# Patient Record
Sex: Female | Born: 1960
Health system: Southern US, Community
[De-identification: ages and names within clinical notes are randomized; demographics above are authoritative.]

## PROBLEM LIST (undated history)

## (undated) DIAGNOSIS — M1990 Unspecified osteoarthritis, unspecified site: Secondary | ICD-10-CM

## (undated) DIAGNOSIS — F419 Anxiety disorder, unspecified: Secondary | ICD-10-CM

## (undated) HISTORY — PX: CATARACT EXTRACTION: SUR2

## (undated) HISTORY — DX: Unspecified osteoarthritis, unspecified site: M19.90

## (undated) HISTORY — PX: OOPHORECTOMY: SHX86

---

## 1998-12-06 ENCOUNTER — Ambulatory Visit (HOSPITAL_BASED_OUTPATIENT_CLINIC_OR_DEPARTMENT_OTHER): Admission: RE | Admit: 1998-12-06 | Discharge: 1998-12-06 | Payer: Self-pay | Admitting: Orthopedic Surgery

## 1998-12-17 ENCOUNTER — Other Ambulatory Visit: Admission: RE | Admit: 1998-12-17 | Discharge: 1998-12-17 | Payer: Self-pay | Admitting: Gynecology

## 1999-09-05 HISTORY — PX: ABDOMINAL HYSTERECTOMY: SHX81

## 2000-03-08 ENCOUNTER — Other Ambulatory Visit: Admission: RE | Admit: 2000-03-08 | Discharge: 2000-03-08 | Payer: Self-pay | Admitting: Obstetrics and Gynecology

## 2000-05-14 ENCOUNTER — Encounter (INDEPENDENT_AMBULATORY_CARE_PROVIDER_SITE_OTHER): Payer: Self-pay | Admitting: Specialist

## 2000-05-14 ENCOUNTER — Inpatient Hospital Stay (HOSPITAL_COMMUNITY): Admission: RE | Admit: 2000-05-14 | Discharge: 2000-05-17 | Payer: Self-pay | Admitting: Gynecology

## 2001-01-20 ENCOUNTER — Inpatient Hospital Stay (HOSPITAL_COMMUNITY): Admission: AD | Admit: 2001-01-20 | Discharge: 2001-01-20 | Payer: Self-pay | Admitting: Gynecology

## 2001-10-18 ENCOUNTER — Encounter: Admission: RE | Admit: 2001-10-18 | Discharge: 2001-10-18 | Payer: Self-pay

## 2002-08-22 ENCOUNTER — Inpatient Hospital Stay (HOSPITAL_COMMUNITY): Admission: AD | Admit: 2002-08-22 | Discharge: 2002-08-22 | Payer: Self-pay | Admitting: Family Medicine

## 2002-09-04 HISTORY — PX: OTHER SURGICAL HISTORY: SHX169

## 2002-10-11 ENCOUNTER — Emergency Department (HOSPITAL_COMMUNITY): Admission: EM | Admit: 2002-10-11 | Discharge: 2002-10-11 | Payer: Self-pay | Admitting: Emergency Medicine

## 2002-10-11 ENCOUNTER — Encounter: Payer: Self-pay | Admitting: Emergency Medicine

## 2002-10-14 ENCOUNTER — Encounter: Admission: RE | Admit: 2002-10-14 | Discharge: 2002-10-14 | Payer: Self-pay | Admitting: Family Medicine

## 2002-10-14 ENCOUNTER — Encounter: Payer: Self-pay | Admitting: Family Medicine

## 2002-11-19 ENCOUNTER — Encounter: Payer: Self-pay | Admitting: Emergency Medicine

## 2002-11-20 ENCOUNTER — Inpatient Hospital Stay (HOSPITAL_COMMUNITY): Admission: EM | Admit: 2002-11-20 | Discharge: 2002-12-02 | Payer: Self-pay | Admitting: Emergency Medicine

## 2002-11-20 ENCOUNTER — Encounter: Payer: Self-pay | Admitting: General Surgery

## 2002-11-21 ENCOUNTER — Encounter: Payer: Self-pay | Admitting: Surgery

## 2002-11-22 ENCOUNTER — Encounter: Payer: Self-pay | Admitting: Internal Medicine

## 2002-11-23 ENCOUNTER — Encounter: Payer: Self-pay | Admitting: Surgery

## 2002-11-24 ENCOUNTER — Encounter: Payer: Self-pay | Admitting: Internal Medicine

## 2002-11-25 ENCOUNTER — Encounter: Payer: Self-pay | Admitting: General Surgery

## 2002-11-26 ENCOUNTER — Encounter (INDEPENDENT_AMBULATORY_CARE_PROVIDER_SITE_OTHER): Payer: Self-pay | Admitting: Specialist

## 2002-11-26 ENCOUNTER — Encounter: Payer: Self-pay | Admitting: Surgery

## 2003-01-29 ENCOUNTER — Ambulatory Visit (HOSPITAL_COMMUNITY): Admission: RE | Admit: 2003-01-29 | Discharge: 2003-01-29 | Payer: Self-pay | Admitting: General Surgery

## 2003-01-29 ENCOUNTER — Encounter: Payer: Self-pay | Admitting: General Surgery

## 2003-02-03 ENCOUNTER — Encounter: Payer: Self-pay | Admitting: Emergency Medicine

## 2003-02-03 ENCOUNTER — Inpatient Hospital Stay (HOSPITAL_COMMUNITY): Admission: EM | Admit: 2003-02-03 | Discharge: 2003-02-06 | Payer: Self-pay

## 2003-02-03 ENCOUNTER — Encounter: Payer: Self-pay | Admitting: Gastroenterology

## 2003-02-04 ENCOUNTER — Encounter: Payer: Self-pay | Admitting: Surgery

## 2003-02-04 ENCOUNTER — Encounter: Payer: Self-pay | Admitting: Gastroenterology

## 2003-11-19 ENCOUNTER — Ambulatory Visit (HOSPITAL_COMMUNITY): Admission: RE | Admit: 2003-11-19 | Discharge: 2003-11-19 | Payer: Self-pay | Admitting: Internal Medicine

## 2003-12-03 ENCOUNTER — Ambulatory Visit (HOSPITAL_COMMUNITY): Admission: RE | Admit: 2003-12-03 | Discharge: 2003-12-03 | Payer: Self-pay | Admitting: Internal Medicine

## 2004-09-09 ENCOUNTER — Ambulatory Visit: Payer: Self-pay | Admitting: Family Medicine

## 2004-09-09 ENCOUNTER — Encounter: Admission: RE | Admit: 2004-09-09 | Discharge: 2004-09-09 | Payer: Self-pay | Admitting: Family Medicine

## 2006-02-15 ENCOUNTER — Other Ambulatory Visit: Admission: RE | Admit: 2006-02-15 | Discharge: 2006-02-15 | Payer: Self-pay | Admitting: Obstetrics and Gynecology

## 2006-03-05 ENCOUNTER — Encounter: Admission: RE | Admit: 2006-03-05 | Discharge: 2006-03-05 | Payer: Self-pay | Admitting: Obstetrics and Gynecology

## 2006-03-16 ENCOUNTER — Encounter: Admission: RE | Admit: 2006-03-16 | Discharge: 2006-03-16 | Payer: Self-pay | Admitting: Obstetrics and Gynecology

## 2007-03-06 ENCOUNTER — Other Ambulatory Visit: Admission: RE | Admit: 2007-03-06 | Discharge: 2007-03-06 | Payer: Self-pay | Admitting: Obstetrics and Gynecology

## 2007-06-11 DIAGNOSIS — K429 Umbilical hernia without obstruction or gangrene: Secondary | ICD-10-CM | POA: Insufficient documentation

## 2007-07-01 ENCOUNTER — Ambulatory Visit: Payer: Self-pay | Admitting: Internal Medicine

## 2007-07-01 DIAGNOSIS — H5450 Low vision, one eye, unspecified eye: Secondary | ICD-10-CM | POA: Insufficient documentation

## 2007-07-01 DIAGNOSIS — K56609 Unspecified intestinal obstruction, unspecified as to partial versus complete obstruction: Secondary | ICD-10-CM | POA: Insufficient documentation

## 2007-07-04 ENCOUNTER — Ambulatory Visit: Payer: Self-pay | Admitting: Internal Medicine

## 2007-07-08 LAB — CONVERTED CEMR LAB
Calcium: 9.2 mg/dL (ref 8.4–10.5)
Cholesterol: 228 mg/dL (ref 0–200)
Eosinophils Relative: 0.6 % (ref 0.0–5.0)
GFR calc Af Amer: 115 mL/min
Hemoglobin: 12.9 g/dL (ref 12.0–15.0)
Lymphocytes Relative: 45 % (ref 12.0–46.0)
MCHC: 33.9 g/dL (ref 30.0–36.0)
MCV: 90.6 fL (ref 78.0–100.0)
Monocytes Relative: 7.7 % (ref 3.0–11.0)
Neutro Abs: 2.2 10*3/uL (ref 1.4–7.7)
Neutrophils Relative %: 46.2 % (ref 43.0–77.0)

## 2007-08-12 ENCOUNTER — Emergency Department (HOSPITAL_COMMUNITY): Admission: EM | Admit: 2007-08-12 | Discharge: 2007-08-12 | Payer: Self-pay | Admitting: Emergency Medicine

## 2008-06-17 LAB — CONVERTED CEMR LAB: Pap Smear: NORMAL

## 2009-04-01 ENCOUNTER — Encounter: Admission: RE | Admit: 2009-04-01 | Discharge: 2009-04-01 | Payer: Self-pay | Admitting: Orthopedic Surgery

## 2009-04-26 ENCOUNTER — Encounter: Admission: RE | Admit: 2009-04-26 | Discharge: 2009-04-26 | Payer: Self-pay | Admitting: Orthopedic Surgery

## 2009-04-29 ENCOUNTER — Encounter: Admission: RE | Admit: 2009-04-29 | Discharge: 2009-04-29 | Payer: Self-pay | Admitting: Orthopedic Surgery

## 2009-12-14 ENCOUNTER — Encounter: Payer: Self-pay | Admitting: Family Medicine

## 2009-12-14 ENCOUNTER — Encounter: Payer: Self-pay | Admitting: Internal Medicine

## 2009-12-15 ENCOUNTER — Encounter (INDEPENDENT_AMBULATORY_CARE_PROVIDER_SITE_OTHER): Payer: Self-pay | Admitting: *Deleted

## 2009-12-15 ENCOUNTER — Ambulatory Visit: Payer: Self-pay | Admitting: Family Medicine

## 2009-12-15 DIAGNOSIS — M1990 Unspecified osteoarthritis, unspecified site: Secondary | ICD-10-CM | POA: Insufficient documentation

## 2009-12-15 LAB — CONVERTED CEMR LAB
ALT: 10 units/L (ref 0–35)
Albumin: 4 g/dL (ref 3.5–5.2)
Alkaline Phosphatase: 66 units/L (ref 39–117)
Basophils Relative: 0.5 % (ref 0.0–3.0)
Bilirubin Urine: NEGATIVE
Bilirubin, Direct: 0.1 mg/dL (ref 0.0–0.3)
Blood in Urine, dipstick: NEGATIVE
Calcium: 8.9 mg/dL (ref 8.4–10.5)
Chloride: 105 meq/L (ref 96–112)
Creatinine, Ser: 0.7 mg/dL (ref 0.4–1.2)
Direct LDL: 141.5 mg/dL
Eosinophils Absolute: 0 10*3/uL (ref 0.0–0.7)
Glucose, Bld: 94 mg/dL (ref 70–99)
HCT: 38.3 % (ref 36.0–46.0)
Hemoglobin: 12.9 g/dL (ref 12.0–15.0)
Ketones, urine, test strip: NEGATIVE
MCHC: 33.6 g/dL (ref 30.0–36.0)
MCV: 91.3 fL (ref 78.0–100.0)
Monocytes Absolute: 0.3 10*3/uL (ref 0.1–1.0)
Neutrophils Relative %: 48.1 % (ref 43.0–77.0)
Platelets: 206 10*3/uL (ref 150.0–400.0)
RBC: 4.19 M/uL (ref 3.87–5.11)
Specific Gravity, Urine: 1.015
TSH: 1.11 microintl units/mL (ref 0.35–5.50)
Total Bilirubin: 0.6 mg/dL (ref 0.3–1.2)
Total CHOL/HDL Ratio: 3
Triglycerides: 101 mg/dL (ref 0.0–149.0)
Urobilinogen, UA: NEGATIVE
VLDL: 20.2 mg/dL (ref 0.0–40.0)
WBC: 4.4 10*3/uL — ABNORMAL LOW (ref 4.5–10.5)
pH: 7.5

## 2009-12-17 ENCOUNTER — Encounter (INDEPENDENT_AMBULATORY_CARE_PROVIDER_SITE_OTHER): Payer: Self-pay | Admitting: *Deleted

## 2009-12-20 ENCOUNTER — Ambulatory Visit: Payer: Self-pay | Admitting: Family Medicine

## 2010-02-07 ENCOUNTER — Encounter: Payer: Self-pay | Admitting: Family Medicine

## 2010-02-07 ENCOUNTER — Encounter: Admission: RE | Admit: 2010-02-07 | Discharge: 2010-02-07 | Payer: Self-pay | Admitting: Family Medicine

## 2010-02-07 LAB — HM MAMMOGRAPHY: HM Mammogram: NEGATIVE

## 2010-10-06 NOTE — Assessment & Plan Note (Signed)
Summary: cpx//pt will be fasting//lch   Vital Signs:  Patient profile:   50 year old female Height:      65.25 inches Weight:      198 pounds BMI:     32.82 Pulse rate:   76 / minute Pulse rhythm:   regular BP sitting:   122 / 80  (left arm) Cuff size:   regular  Vitals Entered By: Army Fossa CMA (December 15, 2009 8:38 AM) CC: Pt here for CPX. No pap.    History of Present Illness: Pt here for CPE and labs.  No pap pt sees gyn but she needs a new one.  No complaints.    Preventive Screening-Counseling & Management  Alcohol-Tobacco     Alcohol drinks/day: 0     Smoking Status: never  Caffeine-Diet-Exercise     Caffeine use/day: <1     Does Patient Exercise: yes     Type of exercise: WALKING     Exercise (avg: min/session): >60     Times/week: 3  Hep-HIV-STD-Contraception     Dental Visit-last 6 months yes     Dental Care Counseling: not indicated; dental care within six months     SBE monthly: yes     SBE Education/Counseling: not indicated; SBE done regularly      Sexual History:  divorced not currently sexually active.        Drug Use:  no.    Current Medications (verified): 1)  None  Allergies (verified): No Known Drug Allergies  Past History:  Family History: Last updated: 07/01/2007 colonca -- no breast ca--no DM--no MI-- in her 24s Family History of CAD Female 1st degree relative <60  Social History: Last updated: 12/15/2009 Single, divorce 1 child Occupation:  Actor Divorced Never Smoked Alcohol use-no Drug use-no Regular exercise-yes  Risk Factors: Alcohol Use: 0 (12/15/2009) Caffeine Use: <1 (12/15/2009) Exercise: yes (12/15/2009)  Risk Factors: Smoking Status: never (12/15/2009)  Past Medical History:  Osteoarthritis  Past Surgical History: obstructed bile duct---Dr Micki Riley-- 2004 Hysterectomy-- TAH/BSO 2001--fibroids  Family History: Reviewed history from 07/01/2007 and no changes required. colonca --  no breast ca--no DM--no MI-- in her 59s Family History of CAD Female 1st degree relative <60  Social History: Reviewed history from 07/01/2007 and no changes required. 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 Eyes:  Denies blurring, discharge, double vision, eye irritation, eye pain, halos, itching, light sensitivity, red eye, vision loss-1 eye, and vision loss-both eyes; optho q1y-- Dr Elmer Picker. ENT:  Denies decreased hearing, difficulty swallowing, ear discharge, earache, hoarseness, nasal congestion, nosebleeds, postnasal drainage, ringing in ears, sinus pressure, and sore throat. CV:  Denies bluish discoloration of lips or nails, chest pain or discomfort, difficulty breathing at night, difficulty breathing while lying down, fainting, fatigue, leg cramps with exertion, lightheadness, near fainting, palpitations, shortness of breath with exertion, swelling of feet, swelling of hands, and weight gain. Resp:  Denies chest discomfort, chest pain with inspiration, cough, coughing up blood, excessive snoring, hypersomnolence, morning headaches, pleuritic, shortness of breath, sputum productive, and wheezing. GI:  Denies abdominal pain, bloody stools, change in bowel habits, constipation, dark tarry  stools, diarrhea, excessive appetite, gas, hemorrhoids, indigestion, loss of appetite, nausea, vomiting, vomiting blood, and yellowish skin color. GU:  Denies abnormal vaginal bleeding, decreased libido, discharge, dysuria, genital sores, hematuria, incontinence, nocturia, urinary frequency, and urinary hesitancy. MS:  Complains of  joint pain; + OA. Derm:  Denies changes in color of skin, changes in nail beds, dryness, excessive perspiration, flushing, hair loss, insect bite(s), itching, lesion(s), poor wound healing, and rash. Neuro:  Denies brief paralysis, difficulty with concentration, disturbances in coordination, falling down, headaches, inability to speak, memory loss, numbness, poor balance, seizures, sensation of room spinning, tingling, tremors, visual disturbances, and weakness. Psych:  Denies alternate hallucination ( auditory/visual), anxiety, depression, easily angered, easily tearful, irritability, mental problems, panic attacks, sense of great danger, suicidal thoughts/plans, thoughts of violence, unusual visions or sounds, and thoughts /plans of harming others. Endo:  Denies cold intolerance, excessive hunger, excessive thirst, excessive urination, heat intolerance, polyuria, and weight change. Heme:  Denies abnormal bruising, bleeding, enlarge lymph nodes, fevers, pallor, and skin discoloration. Allergy:  Denies hives or rash, itching eyes, persistent infections, seasonal allergies, and sneezing.  Physical Exam  General:  Well-developed,well-nourished,in no acute distress; alert,appropriate and cooperative throughout examination Head:  Normocephalic and atraumatic without obvious abnormalities. No apparent alopecia or balding. Eyes:  pupils equal, pupils round, pupils reactive to light, and no injection.   Ears:  External ear exam shows no significant lesions or deformities.  Otoscopic examination reveals clear canals, tympanic membranes are intact bilaterally without bulging, retraction, inflammation or discharge. Hearing is grossly normal bilaterally. Nose:  External nasal examination shows no deformity or inflammation. Nasal mucosa are pink and moist without lesions or exudates. Mouth:  Oral mucosa and oropharynx without lesions or exudates.  Teeth in good repair. Neck:  No deformities, masses, or  tenderness noted. Chest Wall:  No deformities, masses, or tenderness noted. Breasts:  No mass, nodules, thickening, tenderness, bulging, retraction, inflamation, nipple discharge or skin changes noted.   Lungs:  Normal respiratory effort, chest expands symmetrically. Lungs are clear to auscultation, no crackles or wheezes. Heart:  normal rate and no murmur.   Abdomen:  Bowel sounds positive,abdomen soft and non-tender without masses, organomegaly or hernias noted. Msk:  normal ROM, no joint tenderness, no joint swelling, no joint warmth, no redness over joints, no joint deformities, no joint instability, and no crepitation.   Pulses:  R posterior tibial normal, R dorsalis pedis normal, R carotid normal, L posterior tibial normal, L dorsalis pedis normal, and L carotid normal.   Extremities:  No clubbing, cyanosis, edema, or deformity noted with normal full range of motion of all joints.   Neurologic:  No cranial nerve deficits noted. Station and gait are normal. Plantar reflexes are down-going bilaterally. DTRs are symmetrical throughout. Sensory, motor and coordinative functions appear intact. Skin:  Intact without suspicious lesions or rashes Cervical Nodes:  No lymphadenopathy noted Axillary Nodes:  No palpable lymphadenopathy Psych:  Cognition and judgment appear intact. Alert and cooperative with normal attention span and concentration. No apparent delusions, illusions, hallucinations   Impression & Recommendations:  Problem # 1:  PREVENTIVE HEALTH CARE (ICD-V70.0) ghm utd check mammo and bmd Orders: Venipuncture (81191) TLB-Lipid Panel (80061-LIPID) TLB-BMP (Basic Metabolic Panel-BMET) (80048-METABOL) TLB-CBC Platelet - w/Differential (85025-CBCD) TLB-Hepatic/Liver Function Pnl (80076-HEPATIC) TLB-TSH (Thyroid Stimulating Hormone) (47829-FAO) Radiology Referral (Radiology) EKG w/ Interpretation (93000) UA Dipstick w/o Micro (manual) (13086)  Problem # 2:  ARTIFICIAL MENOPAUSE  (ICD-627.4)  Orders: Radiology Referral (Radiology)  Discussed treatment options.   Patient Instructions:  1)  Calcium 1500 mg daily with 1000u vita D    EKG  Procedure date:  12/15/2009  Findings:      Normal sinus rhythm with rate of:  72 bpm    Flu Vaccine Next Due:  Refused TD Result Date:  12/16/2008 TD Result:  given TD Next Due:  10 yr PAP Result Date:  06/17/2008 PAP Result:  normal PAP Next Due:  1 yr Mammogram Result Date:  02/17/2009 Mammogram Result:  normal Mammogram Next Due:  1 yr     Past Medical History:        Osteoarthritis  Laboratory Results   Urine Tests   Date/Time Reported: December 15, 2009 9:57 AM   Routine Urinalysis   Color: yellow Appearance: Clear Glucose: negative   (Normal Range: Negative) Bilirubin: negative   (Normal Range: Negative) Ketone: negative   (Normal Range: Negative) Spec. Gravity: 1.015   (Normal Range: 1.003-1.035) Blood: negative   (Normal Range: Negative) pH: 7.5   (Normal Range: 5.0-8.0) Protein: negative   (Normal Range: Negative) Urobilinogen: negative   (Normal Range: 0-1) Nitrite: negative   (Normal Range: Negative) Leukocyte Esterace: negative   (Normal Range: Negative)    Comments: Floydene Flock  December 15, 2009 9:57 AM

## 2010-10-06 NOTE — Letter (Signed)
Summary: Medical History Form/NCDMV  Medical History Form/NCDMV   Imported By: Lanelle Bal 12/20/2009 11:32:51  _____________________________________________________________________  External Attachment:    Type:   Image     Comment:   External Document

## 2010-10-06 NOTE — Letter (Signed)
Summary: [facility]: Immunoassay Fecal Occult Blood (iFOB) Order Form  [facility]  [address] [place], [place] [address]   Phone: [phone]  Fax: [phone]       Lab: Immunoassay Fecal Occult Blood (iFOB) Order Form   December 15, 2009 MRN: [id number]   KIMBERLYANN HOLLAR 07-Nov-1960   Physicican Name:______Yvonne Lowne,DO___________________  Diagnosis Code:_______v76.51___________________      Army Fossa CMA

## 2010-10-06 NOTE — Letter (Signed)
Summary: Primary Care Consult Scheduled Letter  [facility]  [address] [place], [place] [address]   Phone: [phone]  Fax: [phone]      12/17/2009 MRN: [id number]  [facility] [address], [place]  [address]    Dear Ms. Rastetter-BOWDEN,    We have scheduled an appointment for you.  At the recommendation of Dr. Loreen Freud, we have scheduled you for a Screening Mammogram and Bone Density Scan with [facility] on 02-07-2010 at 2:00pm.  Their address is [address], [address], [place] [address]. The office phone number is [phone].  If this appointment day and time is not convenient for you, please feel free to call the office of the doctor you are being referred to at the number listed above and reschedule the appointment.    It is important for you to keep your scheduled appointments. We are here to make sure you are given good patient care.   Thank you,    Renee, Patient Care Coordinator [place] [facility]

## 2010-12-04 HISTORY — PX: ROTATOR CUFF REPAIR: SHX139

## 2010-12-28 ENCOUNTER — Encounter (HOSPITAL_COMMUNITY)
Admission: RE | Admit: 2010-12-28 | Discharge: 2010-12-28 | Disposition: A | Discharge status: Home / Self Care | Payer: BC Managed Care – PPO | Source: Ambulatory Visit | Attending: Orthopedic Surgery | Admitting: Orthopedic Surgery

## 2010-12-28 ENCOUNTER — Other Ambulatory Visit (HOSPITAL_COMMUNITY): Payer: Self-pay | Admitting: Orthopedic Surgery

## 2010-12-28 ENCOUNTER — Ambulatory Visit (HOSPITAL_COMMUNITY)
Admission: RE | Admit: 2010-12-28 | Discharge: 2010-12-28 | Disposition: A | Discharge status: Home / Self Care | Payer: BC Managed Care – PPO | Source: Ambulatory Visit | Attending: Orthopedic Surgery | Admitting: Orthopedic Surgery

## 2010-12-28 DIAGNOSIS — Z01818 Encounter for other preprocedural examination: Secondary | ICD-10-CM | POA: Insufficient documentation

## 2010-12-28 DIAGNOSIS — M75101 Unspecified rotator cuff tear or rupture of right shoulder, not specified as traumatic: Secondary | ICD-10-CM

## 2010-12-28 DIAGNOSIS — M719 Bursopathy, unspecified: Secondary | ICD-10-CM | POA: Insufficient documentation

## 2010-12-28 DIAGNOSIS — M67919 Unspecified disorder of synovium and tendon, unspecified shoulder: Secondary | ICD-10-CM | POA: Insufficient documentation

## 2010-12-28 DIAGNOSIS — Z01812 Encounter for preprocedural laboratory examination: Secondary | ICD-10-CM | POA: Insufficient documentation

## 2010-12-28 LAB — CBC
MCH: 29.3 pg (ref 26.0–34.0)
MCV: 90 fL (ref 78.0–100.0)

## 2010-12-28 LAB — COMPREHENSIVE METABOLIC PANEL
ALT: 11 U/L (ref 0–35)
AST: 17 U/L (ref 0–37)
Albumin: 4.1 g/dL (ref 3.5–5.2)
Alkaline Phosphatase: 62 U/L (ref 39–117)
Chloride: 104 mEq/L (ref 96–112)

## 2010-12-28 LAB — SURGICAL PCR SCREEN: Staphylococcus aureus: NEGATIVE

## 2010-12-28 LAB — URINALYSIS, ROUTINE W REFLEX MICROSCOPIC
Hgb urine dipstick: NEGATIVE
pH: 7 (ref 5.0–8.0)

## 2010-12-29 ENCOUNTER — Observation Stay (HOSPITAL_COMMUNITY)
Admission: RE | Admit: 2010-12-29 | Discharge: 2010-12-31 | Disposition: A | Discharge status: Home / Self Care | Payer: BC Managed Care – PPO | Source: Ambulatory Visit | Attending: Orthopedic Surgery | Admitting: Orthopedic Surgery

## 2010-12-29 DIAGNOSIS — Z01810 Encounter for preprocedural cardiovascular examination: Secondary | ICD-10-CM | POA: Insufficient documentation

## 2010-12-29 DIAGNOSIS — M25819 Other specified joint disorders, unspecified shoulder: Secondary | ICD-10-CM | POA: Insufficient documentation

## 2010-12-29 DIAGNOSIS — Z01812 Encounter for preprocedural laboratory examination: Secondary | ICD-10-CM | POA: Insufficient documentation

## 2010-12-29 DIAGNOSIS — M719 Bursopathy, unspecified: Secondary | ICD-10-CM | POA: Insufficient documentation

## 2010-12-29 DIAGNOSIS — Z01818 Encounter for other preprocedural examination: Secondary | ICD-10-CM | POA: Insufficient documentation

## 2010-12-29 DIAGNOSIS — M67919 Unspecified disorder of synovium and tendon, unspecified shoulder: Secondary | ICD-10-CM | POA: Insufficient documentation

## 2010-12-29 DIAGNOSIS — M24119 Other articular cartilage disorders, unspecified shoulder: Principal | ICD-10-CM | POA: Insufficient documentation

## 2010-12-29 DIAGNOSIS — M6740 Ganglion, unspecified site: Secondary | ICD-10-CM | POA: Insufficient documentation

## 2010-12-30 LAB — GLUCOSE, CAPILLARY: Glucose-Capillary: 159 mg/dL — ABNORMAL HIGH (ref 70–99)

## 2011-01-19 ENCOUNTER — Ambulatory Visit: Payer: BC Managed Care – PPO | Attending: Orthopedic Surgery

## 2011-01-19 DIAGNOSIS — IMO0001 Reserved for inherently not codable concepts without codable children: Secondary | ICD-10-CM | POA: Insufficient documentation

## 2011-01-19 DIAGNOSIS — M6281 Muscle weakness (generalized): Secondary | ICD-10-CM | POA: Insufficient documentation

## 2011-01-19 DIAGNOSIS — M25519 Pain in unspecified shoulder: Secondary | ICD-10-CM | POA: Insufficient documentation

## 2011-01-19 DIAGNOSIS — M25619 Stiffness of unspecified shoulder, not elsewhere classified: Secondary | ICD-10-CM | POA: Insufficient documentation

## 2011-01-19 NOTE — H&P (Signed)
  NAMEORLANDA, FRANKUM         ACCOUNT NO.:  [id number]  MEDICAL RECORD NO.:  [id number]           PATIENT TYPE:  O  LOCATION:  5025                         FACILITY:  [facility]  PHYSICIAN:  Myrtie Neither, MD      DATE OF BIRTH:  Nov 16, 1960  DATE OF ADMISSION:  12/29/2010 DATE OF DISCHARGE:                             HISTORY & PHYSICAL   CHIEF COMPLAINT:  Severe painful right shoulder.  HISTORY OF PRESENT ILLNESS:  This is a 50 year old female who has had recurrent problems with right shoulder with impingement syndrome, and over the past few months, developed resurgence of pain, progressively getting worse.  The patient was treated with anti-inflammatories and steroid orally, and with some improvement with persistence and recurrence of pain in the right shoulder and difficulty reaching and lifting.  The patient had an MRI, which demonstrated ganglion cyst, intracapsular, right shoulder with labral tear and infraspinatus cuff tendon tear.  PAST MEDICAL HISTORY:  Previous impingement, rotator cuff symptoms.  No history of high blood pressure or diabetes.  ALLERGIES:  None known.  The patient has had previous history of hysterectomy and small bowel adhesion removal.  SOCIAL HISTORY:  Negative for use of alcohol, tobacco, or illegal drugs.  FAMILY HISTORY:  Noncontributory.  REVIEW OF SYMPTOMS:  Basically that as in history of present illness. No cardiac, respiratory, and no urinary or bowel symptoms.  PHYSICAL EXAMINATION:  Height 65 inches, weight 88.7 kg, temperature 98.7, pulse 80, respirations 18, O2 saturation 98%, blood pressure 121/83. HEAD:  Normocephalic. EYES:  Conjunctivae and sclerae clear. NECK:  Supple. CHEST:  Clear. CARDIAC:  S1 and S2 regular. EXTREMITIES:  Right shoulder tender anterior and lateral, subacromial crepitus with increased pain on abduction above 9 degrees and on resisted abduction.  Good grip, pinch, and intrinsics intact.  MRI  demonstrates ganglion cyst, intracapsular, labral tear, and infraspinatus cuff tendon disruption.  IMPRESSION:  Impingement syndrome, right shoulder, infraspinatus tendon cuff tear, tear of the labrum, with ganglion cyst intracapsular.  PLAN:  Arthroscopy, right shoulder, with possible rotator cuff repair, possible labral repair, right shoulder.     Myrtie Neither, MD     AC/MEDQ  D:  12/29/2010  T:  12/29/2010  Job:  [id number]  Electronically Signed by Myrtie Neither MD on 01/19/2011 03:23:47 PM

## 2011-01-19 NOTE — Op Note (Signed)
  [name], [name]         ACCOUNT NO.:  [id number]  MEDICAL RECORD NO.:  [id number]           PATIENT TYPE:  O  LOCATION:  5025                         FACILITY:  [facility]  PHYSICIAN:  Audrey Neither, MD      DATE OF BIRTH:  Dec 13, 1960  DATE OF PROCEDURE:  12/29/2010 DATE OF DISCHARGE:                              OPERATIVE REPORT   PREOPERATIVE DIAGNOSIS:  Labral tear, right shoulder; ganglion cyst, intracapsular, right shoulder; infraspinatus tendon cuff tear, right shoulder; impingement syndrome, right shoulder.  POSTOPERATIVE DIAGNOSIS:  Labral tear, right shoulder; ganglion cyst, intracapsular, right shoulder; infraspinatus tendon cuff tear, right shoulder; impingement syndrome, right shoulder.  ANESTHESIA:  General.  PROCEDURES: 1. Arthroscopic labral tear debridement. 2. Excision of cyst, intracapsular, right shoulder. 3. Arthroscopic acromioplasty and synovectomy, right shoulder.  DESCRIPTION OF PROCEDURE:  The patient was taken to the operating room after given adequate preop medications, given general anesthesia and intubated.  Right shoulder was prepped with DuraPrep and draped in a sterile manner.  The patient was placed in barber chair position. Posterior incision was made over the right shoulder, going through the skin and subcutaneous tissue.  Trocar was placed into the joint capsule. Arthroscope was placed posterior to anterior.  Inflow water was measured through the arthroscope.  Inspection revealed a degenerative tear of the labrum anteriorly at 3 o'clock position as well as large redundant soft tissue overgrowth, cystic changes about 12 o'clock to 1 o'clock position.  The glenoid fossa had a central degenerative change about size of a 10 cent piece.  The humeral head had some mild degenerative change, which was pretty well preserved.  With a synovial shaver placed anterior, a labral debridement was done, resection of cystic and over redundant tissue  about the labrum superiorly was also resected.  Other lesser fragments were removed as well.  Next, approach was made into the subacromial space.  There was hypertrophic growth of the subacromial bursal sac, downsloping anteriorly of the acromion.  There is tenopathy changes involving the rotator cuff, but no apparent complete defect. With a synovial shaver placed laterally, complete synovectomy was then done followed by acromioplasty with the use of bur with the bur posteriorly and the scope laterally.  After adequate decompression of acromioplasty, other loose fragments were cleared off.  There was good joint space between the acromion and the cuff.  Wound closure was then done.  Compressive dressing was applied.  The patient had previous shoulder block and so Marcaine was not needed.  The patient was placed in a sling, tolerated the procedure quite well, went to recovery room in stable and satisfactory condition.  The patient is being kept 23-hour observation for pain control and will be discharged on Percocet 1-2 q.6 p.r.n. for pain, ice packs, and to return to the office in 1 week.  The patient will be discharged in stable and satisfactory condition.     Audrey Neither, MD     AC/MEDQ  D:  12/29/2010  T:  12/29/2010  Job:  [id number]  Electronically Signed by Audrey Neither MD on 01/19/2011 03:23:50 PM

## 2011-01-19 NOTE — Discharge Summary (Signed)
  NAMEGENASIS, ZINGALE         ACCOUNT NO.:  [id number]  MEDICAL RECORD NO.:  [id number]           PATIENT TYPE:  O  LOCATION:  5025                         FACILITY:  [facility]  PHYSICIAN:  Myrtie Neither, MD      DATE OF BIRTH:  07-18-1961  DATE OF ADMISSION:  12/29/2010 DATE OF DISCHARGE:  12/31/2010                              DISCHARGE SUMMARY   ADMITTING DIAGNOSES:  Labral tear right shoulder; ganglion cyst, intracapsular, right shoulder; infraspinatus cuff tear, right shoulder; impingement syndrome, right shoulder.  DISCHARGE DIAGNOSES:  Labral tear right shoulder; ganglion cyst, intracapsular, right shoulder; infraspinatus cuff tear, right shoulder; impingement syndrome, right shoulder.  COMPLICATIONS:  None.  INFECTIONS:  None.  OPERATIONS:  Arthroscopy right shoulder with arthroscopic acromioplasty and synovectomy, arthroscopic debridement of the labrum, and excision of cyst right shoulder.  PERTINENT HISTORY:  This is a 50 year old female who had been having persistent and unremitting pain in the right shoulder, been treated with anti-inflammatories and use of oral steroids without improvement.  MRI demonstrated a large ganglion cyst intracapsular in the right shoulder with labral tear and infraspinatus rotator cuff tear, right shoulder.  Pertinent physical was that of right shoulder tender anterior and laterally.  Pain on abduction above 90 degrees and increased pain on resistive abduction.  Subacromial crepitus.  Good grip and pinch. Intrinsics intact.  HOSPITAL COURSE:  The patient underwent preop laboratory; CBC, EKG, chest x-ray, UA, CMET.  The patient's labs were found to be stable enough to undergo surgery.  The patient underwent arthroscopy of the right shoulder, tolerated procedure quite well.  Postoperatively, the patient had difficulty with pain control as well as with persistent nausea and vomiting.  The patient had to be admitted with IV fluids  and use of Zofran and IV Dilaudid and then transferred over to Percocet q.4 h. p.r.n.  Nausea and vomiting subsided and brought under control as well as pain.  The patient is stable enough to be discharged and is discharged on Percocet 10/650 q.4 h. p.r.n. for pain, ice packs, use of sling, and return to the office in 1 week.  The patient being discharged in stable and satisfactory condition.     Myrtie Neither, MD    AC/MEDQ  D:  12/30/2010  T:  12/31/2010  Job:  [id number]  Electronically Signed by Myrtie Neither MD on 01/19/2011 03:23:42 PM

## 2011-01-20 NOTE — Discharge Summary (Signed)
NAMEJACORA, HOPKINS                       ACCOUNT NO.:  [id number]   MEDICAL RECORD NO.:  [id number]                   PATIENT TYPE:  INP   LOCATION:  0462                                 FACILITY:  [facility]   PHYSICIAN:  Judie Petit T. Russella Dar, M.D. [facility]          DATE OF BIRTH:  1961-07-24   DATE OF ADMISSION:  02/03/2003  DATE OF DISCHARGE:  02/06/2003                                 DISCHARGE SUMMARY   ADMITTING DIAGNOSES:  82. A 50 year old African-American female with severe abdominal pain,     progressive, status post recent lysis of adhesions and appendectomy for     partial small bowel obstruction.  Rule out recurrent partial bowel     obstruction.  Rule out acute cholecystitis.  Rule out pain secondary to     ileus or obstipation.  2. Status post hysterectomy in 2001.   DISCHARGE DIAGNOSES:  25. A 50 year old female with acute severe abdominal pain, etiology not     clear, though felt to be incisional-type pain without obvious hernia or     other defect.  Question neuropathic incisional pain.  2. Status post hysterectomy in 2001.   CONSULTATIONS:  Surgery - Dr. Zachery Dakins and Dr. Jamey Ripa.   PROCEDURES:  1. CT scan of the abdomen and pelvis.  2. Thoracic spine film.  3. Plain abdominal films.  4. Upper abdominal ultrasound.   BRIEF HISTORY:  Lilleigh is a pleasant 50 year old female status post  hysterectomy in 2001 for benign disease.  She has otherwise been healthy.  She was admitted in 3/04 on the surgery service with acute abdominal pain.  CT scan showed some thickened distal small bowel and signs of partial  obstruction, though no definite transition point.  Small bowel follow  through showed some narrowing in the distal 10-12 cm of the terminal ileum.  She failed attempts at conservative management, and eventually underwent  laparotomy with lysis of adhesions and incidental appendectomy, as well as  drainage of a small ovarian cyst per Dr. Zachery Dakins.  She did well  postoperatively, and says she was feeling fine until about two weeks ago  when she began with some mild right-sided abdominal pain which has persisted  and progressed.  She now describes it as being constant, more noticeable in  the right upper quadrant with some radiation to her back.  She describes it  as sharp and stabbing and worse over the 36 hours prior to admission.  She  said she has been unable to eat or sleep and had been pacing the floor all  night due to pain.  She complained of increased discomfort with any  movement.  She had been in the emergency room on 01/29/03 for less severe  abdominal pain.  Plain films at that time were negative for obstruction.  It  was felt that she may have been constipated, and was instructed to take  laxatives and an enema, from which she had minimal results.  She has at this  time taken Ex-Lax the day prior to admission, still without much result.  No  associated fever or chills.  No dysuria or hematuria.  She has had nausea,  but no vomiting.  At this time, she comes back to the emergency room with  progressive pain.  Plain films showed a few dilated loops of small bowel,  question ileus.  White count was 3.5.  Studies otherwise unremarkable.  She  was admitted for pain control, hydration, and further diagnostic work-up.   LABORATORY STUDIES:  On admission, labs showed a WBC of 3.5, hemoglobin  12.4, hematocrit 37.4, MCV of 89.2, platelets 209.  Follow up on 02/04/03  showed WBC of 4, hemoglobin 11.2, hematocrit 33.6.  Electrolytes were within  normal limits.  Albumin 3.9.  Liver function studies normal.  Lipase 19 on  admission.  Urine pregnancy test negative.  UA negative.   X-RAY STUDIES:  CT scan of the abdomen and pelvis on 02/03/03 showed stable  slight cardiomegaly; otherwise, negative study.  Pelvic CT was a normal  postoperative study.  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  Normal lumbar spine.  Plain abdominal films on 02/04/03 showed no  evidence of acute obstruction.  Upper abdominal ultrasound was also done.  Report is not in the chart at the time of this dictation, but was read as  normal.   HOSPITAL COURSE:  The patient was admitted to the service of Dr. Claudette Head, who was covering on call.  She was placed on IV fluids, kept NPO,  given Demerol and Phenergan for control of her pain, and scheduled for CT  scan of the abdomen and pelvis with findings as outlined above.  We did ask  surgery to see her in consultation, as her pain 1) seemed to be fairly  severe, and 2) had some component consistent with incisional-type pain, as  she was quite tender to very light palpation of the abdomen.  She was seen  by Dr. Gerrit Friends, who agreed with follow up films and review of her CT to rule  out incisional hernia.  This was done.  Apparently there was no evidence of  incisional hernia noted or partial obstruction.  She was also followed up by  Dr. Zachery Dakins, who had done her recent surgery.  He suggested stopping her  IV narcotics and putting her on p.o. pain medication and advancing her diet.  He felt that there was no obvious abnormality noted on exam.  She was also  seen by Dr. Jamey Ripa on 02/05/03, who suggested she may have incisional pain,  and that perhaps a Lidoderm patch would be helpful.  By 02/06/03, she was felt  to be stable, was able to tolerate a diet, and her pain was fairly well  controlled with Lidoderm and p.r.n. Vicodin.   She was discharged to home in a stable condition with instructions to use a  Lidoderm patch q.12h. and to apply over the painful area of her incision,  and also given Vicodin 5/500 one p.o. q.6h. p.r.n. pain.  Other medications  as previous.  She was to return to work on 02/11/03, and was to follow up with  Dr. Zachery Dakins in approximately one week in his office, and was also made a follow up with Dr.  Lina Sar in the office on Monday, 03/02/03, at 1:15  p.m.   CONDITION ON DISCHARGE:  Stable.  Amy Esterwood, P.A.-C. [facility]                Malcolm T. Russella Dar, M.D. [facility]    AE/MEDQ  D:  02/16/2003  T:  02/16/2003  Job:  [id number]   cc:   Anselm Pancoast. Zachery Dakins, M.D.  [address], [address]  [place]  [place] [address]  Fax: [phone]   [facility]

## 2011-01-20 NOTE — H&P (Signed)
[name], [name]                       ACCOUNT NO.:  [id number]   MEDICAL RECORD NO.:  [id number]                   PATIENT TYPE:  INP   LOCATION:  0378                                 FACILITY:  [facility]   PHYSICIAN:  Ollen Gross. Vernell Morgans, M.D.              DATE OF BIRTH:  1960-11-30   DATE OF ADMISSION:  11/20/2002  DATE OF DISCHARGE:                                HISTORY & PHYSICAL   CHIEF COMPLAINT:  [name] presents with a chief complaint of belly pain.   HISTORY OF PRESENT ILLNESS:  [name] is a 50 year old black female, who  presents tonight with a 3-4 month history of sort of episodic, monthly  abdominal pain.  The pain she describes as more of a burning pain down in  her pelvis.  She states that she has had this pain several times before, and  the pains are usually about a month apart when they develop.  The pain has  been similar in character that she has had in the past and has been similar  in character to the pain she has tonight but never quite as severe as the  pain she has had tonight.  Since being in the emergency department, she has  felt a lot better.  The pain was associated last month with diarrhea.  Since  that time, she has not really noticed any more diarrhea, and her bowels have  been working on a regular basis.  She has not noticed any blood in her  stools.   REVIEW OF SYSTEMS:  She denies any fevers.  She has noticed a few chills  occasionally.  No diarrhea or dysuria, chest pain or shortness of breath.  Her other review of systems is unremarkable.   PAST MEDICAL HISTORY:  1. Uterine fibroids,  2. Abdominal pain as described above.   PAST SURGICAL HISTORY:  Hysterectomy for the fibroids.   MEDICATIONS:  1. Prevacid.  2. Aciphex.  3. Phenergan.   ALLERGIES:  No known drug allergies.   SOCIAL HISTORY:  She denies use of tobacco or tobacco products.   FAMILY HISTORY:  Significant for blood clots in her parents.   PHYSICAL EXAMINATION:   VITAL SIGNS:  Temperature 97.3, blood pressure  103/69, pulse 84.  GENERAL:  She is a well-developed, well-nourished, black female, in no acute  distress.  SKIN:  Warm and dry with no jaundice.  EYES:  Extraocular movements intact.  Pupils equal, round, and reactive to  light, although she does have a little bit of disconjugate gaze.  NECK:  No bruits.  Cannot palpate any masses.  Her trachea is midline.  LUNGS:  Clear bilaterally.  HEART:  Regular rate and rhythm.  ABDOMEN:  Currently is soft.  She will occasionally complain of some sort of  diffuse abdominal pain with palpation but currently has no guarding, no  rebound, no peritoneal signs, and her abdomen is quite soft.  She has good  bowel sounds and a well-healed central transverse scar.  EXTREMITIES:  No cyanosis, clubbing, or edema.  PSYCHOLOGIC:  Alert and oriented x 3.   LABORATORY DATA:  On evaluation of her blood work, she has a UA that shows  some protein and ketones.  Her white count is 5300, hemoglobin 14.9,  hematocrit 44.8, platelet count 243.  Amylase is 80.  Sodium 138, potassium  4.0, chloride 104, CO2 27, BUN 16, creatinine 1, glucose 118, total bili  1.2, alk. phos. 73, SGOT 19, SGPT 13.  On review of her CT scan with a  radiologist, she has a small amount of free fluid in her pelvis.  She has  some loop of small bowel down in her pelvis that appears to have a somewhat  thickened wall.  There is no evidence for abscess or obstruction.  There is  no identification of the appendix, and there does not appear to be any  unusual stranding around the cecum.   ASSESSMENT AND PLAN:  This is a 50 year old black female with sort of  episodic, burning, monthly abdominal pain.  She does have some findings on  CT scan that can account for her pain with a thickened small bowel loop down  in the pelvis.  The etiology of this is unclear at this point in time but  certainly would include an infectious or ischemic process.  On  examining  her, she has no signs of sepsis or peritonitis that would warrant urgent  surgery at this point in time and no signs of obstruction.  We will plan to  admit her for IV hydrated and pain control.  We will start her on some  empiric broad-spectrum antibiotics and repeat her lab work and plain  abdominal films in the morning and evaluate her progress.                                               Ollen Gross. Vernell Morgans, M.D.    PST/MEDQ  D:  11/20/2002  T:  11/20/2002  Job:  [id number]

## 2011-01-20 NOTE — Discharge Summary (Signed)
[name], [name]                       ACCOUNT NO.:  [id number]   MEDICAL RECORD NO.:  [id number]                   PATIENT TYPE:  INP   LOCATION:  0380                                 FACILITY:  [facility]   PHYSICIAN:  Anselm Pancoast. Zachery Dakins, M.D.          DATE OF BIRTH:  [birth year]   DATE OF ADMISSION:  11/19/2002  DATE OF DISCHARGE:  12/02/2002                                 DISCHARGE SUMMARY   DISCHARGE DIAGNOSES:  1. High-grade partial small bowel obstruction secondary to adhesions.  2. Left ovarian cyst.  3. Periappendicitis.   OPERATION:  Exploratory laparotomy, lysis of adhesions, incidental  appendectomy, and removal of left ovarian cyst.   HOSPITAL COURSE:  The patient is a 50 year old female who presented to the  emergency room with a three to four month history of episodic mostly  abdominal pain.  She describes this more of a burning down into her pelvis,  and she has had the pain several times before, and about month she has been  being evaluated by her medical physician, I think it is one of the [place]  primary care physicians, and is treated with H2 blocker.  Because of  increasing pain she presented to the emergency room on 11/20/02.  Dr. Carolynne Edouard  was on call, and he was asked to see her.  She was on Prevacid, Aquabid, and  Phenergan p.r.n. for nausea.  On abdominal examination she was noted to have  a soft abdomen, complaining of diffuse lower abdominal cramping sensation,  no rebound, no peritoneal signs, and has a well-healed central transverse  incision from a hysterectomy.  He obtained laboratory studies.  The  electrolytes were normal.  Liver function tests were normal.  White blood  cell count was 5300, and had a CT scan which showed a small amount of free  fluid in the pelvis, some loops of small bowel, it appeared somewhat  thickened, but not that of an obvious obstruction.  He admitted her, but was  going on basically vacation the next day, and turned  her over to Dr. Jamey Ripa.  Dr. Jamey Ripa saw her, and also she was seen by the GI people of , and  had a small bowel series scheduled and Dr. Juanda Chance originally sent for her,  and then it was Dr. Russella Dar, and the small bowel series did not show an  obstruction, but it did show what appeared to be a transition zone in the  distal small bowel where it went from being dilated to decompressed.  The  colon itself looked unremarkable on the small bowel series.  Over the  weekend, Dr. Ezzard Standing saw her on a couple of occasions, and he thought that  she was improving, but then the patient had cramp and bloating at the time  she started on a diet.  On Tuesday, she was turned back over to basically  Dr. Jamey Ripa who had seen her on occasion, and he felt that  with the signs  showing kind of a partial obstruction, intermittent pain with trying to eat,  that surgery was indicated, but really no OR time was available, and asked  if I could add on for an OR schedule.  I saw her and was not originally  convinced that she had an obstruction since there was definitely barium in  the colon, and wondered if she could possibly have gallbladder in origin  since she had not had an ultrasound of the gallbladder.  This was obtained.  On a KUB we can still see that there was a dilated proximal small bowel in  spite of barium getting into the colon, and I agreed that laparotomy was  indicated.  She was taken to surgery.  Dr. Samuella Cota assisted on 11/26/02, and  what we found was that she had an adhesion in the pelvis with a loop of  dilated small bowel that was kind of caught within this.  The appendix went  right down into the pelvis and was slightly inflamed, but it appeared to be  kind of a periappendicitis, and she also had a left ovarian cyst.  The cyst  was removed, incidental appendectomy was performed.  The adhesions were  lysed.  The abdominal incision was closed.  She had a moderate amount of NG  drainage for  approximately two days, and then started having a little  flatus.  Her diet was started after the NG tube was removed.  She was  cramping some, but diet was advanced and she was ready for discharge in  improved condition on 12/02/02.   FOLLOWUP:  She will see [place] for a wound check in approximately a week.  I  will remove her staples in the office.   She works I think in [employer], and will probably be off for about  two to three weeks, and we will make a definite time when she returns to  work in the office.   The pathology report showed a periappendicitis, simple cyst that was  probably about 4 or 5 cm in size was felt to be a corpus luteal cyst, and  the true cause of the problem was this high-grade partial obstruction and  adhesions and loop of bowel partially obstructed in the pelvis.  Hopefully,  this will not be a reoccurring problem, and her incision is healing nicely  at the time of discharge.                                               Anselm Pancoast. Zachery Dakins, M.D.    WJW/MEDQ  D:  12/11/2002  T:  12/12/2002  Job:  [id number]   cc:   Venita Lick. Russella Dar, M.D. [facility]

## 2011-01-20 NOTE — Op Note (Signed)
[name], [name]                       ACCOUNT NO.:  [id number]   MEDICAL RECORD NO.:  [id number]                   PATIENT TYPE:   LOCATION:                                       FACILITY:  [facility]   PHYSICIAN:  Anselm Pancoast. Zachery Dakins, M.D.          DATE OF BIRTH:  1961/03/24   DATE OF PROCEDURE:  11/26/2002  DATE OF DISCHARGE:                                 OPERATIVE REPORT   PREOPERATIVE DIAGNOSIS:  High grade partial small bowel obstruction, that  was probably secondary to adhesions.   POSTOPERATIVE DIAGNOSES:  High grade partial obstruction to the terminal  ileum, secondary to adhesions and to a small left ovarian cyst.   OPERATIONS:  1. Exploratory laparotomy and lysis of adhesions for mechanical small bowel     obstruction.  2. Drainage of ovarian cyst.  3. Incidental appendectomy.   ANESTHESIA:  General.   SURGEON:  Anselm Pancoast. Zachery Dakins, M.D.   ASSISTANT:  Donnie Coffin. Samuella Cota, M.D.   INDICATIONS:  The patient is a 50 year old female who was admitted  approximately six days ago by Dr. Carolynne Edouard, who was on call for the [facility]  ER. The patient had, for approximately two months, episodes of abdominal  pain -- usually upper mid abdomen to the right of the abdomen.  She had been  seen in the emergency room; she had also been seen by one of the [facility]  physicians and had been placed on H2 blocker. She had onset of significant  nausea and vomiting about 24 hours and presented to the emergency room,  where she was seen by the ER physician.   A CT without contrast was performed. Her white count was normal and the CT  was kind of iffy as to whether it was abnormal.  There was dilated small  bowel, but not a definite obstruction noted; no evidence of appendicitis.  She was admitted by Dr. Carolynne Edouard, who was on call.   As far as a definite diagnosis, he was unsure.  He thought there was a  dilated loop of small bowel; whether this was infectious or whatever process  he was not  sure, but did not think that she was definitely obstructed.  She  was admitted for IV hydration, pain control and placed on broad-spectrum  antibiotics.   Dr. Carolynne Edouard was off the next day and Dr. Jamey Ripa saw her.  She was  uncomfortable, but was felt to be improved.  He recommended that the GI  service see her; this was performed and they had Dr. Juanda Chance see her.  Stool  Hemoccults were negative.  Dr. Juanda Chance doubted that this was an ischemic  process, felt that it was probably adhesions.  Then, over the weekend, she  was seen by Dr. Russella Dar and  Dr. Ezzard Standing saw her on one or two days.  It was  felt that she was feeling better.   She was still having episodes of pain if  she would try to eat and Dr. Jamey Ripa  ordered a small bowel series (which was done on Monday).  This was done by  Dr. Stevphen Meuse; he was thinking that there was a transition point very close  to the terminal ileum, but whether it was adhesions or just what he was not  sure.   Dr. Jamey Ripa asked me if I could get on the OR schedule yesterday.  When I saw  the patient I was not sure whether this was or was not obstruction, since  the barium had gone into the colon.  I could not attain she had ever had an  ultrasound of the gallbladder (and the pain had definitely been to the  right, kind of mid abdomen).  I recommended that we try to get the barium  out; did a KUB last night.  She did have an ultrasound this morning that  showed no signs in the gallbladder.  The KUB last night and then a follow-up  KUB this morning showed that there was still dilated loops of small bowel.  You could see most of the barium in the colon, but there was definitely a  transition at the very terminal ileum.  I was then in agreement that this  was a high-grade partial obstruction, giving her the cramping sensation.  I  thought it would be unlikely to be Crohn's, but discussed with the patient  that we would do an exploratory laparotomy.  If it was adhesions,  we would  lyse them.  If it was a questionable area of the distal small bowel,  inflammatory bowel disease or anything, we would actually resect the area.  The patient was in agreement.   DESCRIPTION OF PROCEDURE:  She was taken to surgery.  She has PAS stockings.  Induction of general anesthesia we did place with an endotracheal tube.  The  NG tube was in place into the stomach.  A Foley catheter was inserted  sterilely, and then the abdomen was prepped.  A lower midline incision was  made after draping the patient.   Upon opening into the peritoneal cavity, she did have a definite area of  adhesions -- pretty densely adherent to the undersurface of the lower  abdomen.  That was not truly the point of obstruction, but there were denser  adhesions kind of down in the pelvis that was actually the point of  obstruction.  This most distal area of the terminal ileum just was  decompressed normal bowel by inspection.  There were some fibrinous  adhesions from the tubes and fallopian tube, but she possibly has had a  previous pelvic inflammatory disease; these were taken down.  There was a  very long appendix going down deep into the pelvis.  The appendix was not  inflamed, but we elected to go ahead and do an incidental appendectomy.   The mesentery was divided between [facility] clamps.  These were tied with 2-0  Vicryl through and 2-0 Vicryl was used to tie the patient's appendix with a  pursestring of 3-0 silk.  The appendix was inverted and the pursestring  suture tied.   Next, I ran the small bowel up to the upper abdomen.  There was a very small  little area that was not a Meckel's, but thought it was slight variation in  the area where a Meckel's would have been.  But, when this was left  something was definitely not a point of obstruction, it was no Meckel's  itself.  The patient did have a left ovarian cyst; it was probably about 3 cm in  size.  I think that is unlikely to have been  causing any problems; but, I  elected to just kind of pop the cyst and then sutured the area with a few  figure-of-eight sutures of 3-0 Vicryl.   The small bowel was placed back into anatomical position.  There was one  adhesion to the undersurface of an area that probably had been a  laparoscopic port up above the umbilicus; this was taken down.  This freed  the omentum so that the omentum could be brought down over the loops of  small bowel.  The nasogastric tube was in good position, and the small bowel  was placed in the abdomen, making note that it is in normal anatomical  position.  The omentum was brought down over this, and the incision (which  was really a lower midline) was closed in two layers with #1 PDS, knots  inverted on the superficial layer.  The skin was closed with staples.    DISPOSITION:  We will use PCA morphine as postoperative pain control.  I do  not think she will really need antibiotics as this was not really an  infection; will allow for the initial doses.  Hopefully she will start  having bowel function in two to three days, and had felt cramping symptoms.  There was barium in the right colon, but otherwise we could not see or feel  anything in the colon that looked abnormal.                                               Anselm Pancoast. Zachery Dakins, M.D.    WJW/MEDQ  D:  11/26/2002  T:  11/27/2002  Job:  [id number]   cc:   Venita Lick. Russella Dar, M.D. [facility]

## 2011-01-20 NOTE — Consult Note (Signed)
[name], TEST                       ACCOUNT NO.:  [id number]   MEDICAL RECORD NO.:  [id number]                   PATIENT TYPE:  INP   LOCATION:  0462                                 FACILITY:  [facility]   PHYSICIAN:  [name], M.D.                DATE OF BIRTH:  09-May-1961   DATE OF CONSULTATION:  02/03/2003  DATE OF DISCHARGE:                                   CONSULTATION   REFERRING PHYSICIAN:  Dr. Judie Petit T. Huffman Dar.   REASON FOR CONSULTATION:  Abdominal pain.   HISTORY OF PRESENT ILLNESS:  The patient is a 50 year old black female  admitted onto the gastroenterology service at [facility]  today, February 03, 2003, with abdominal pain.  The patient has a significant  recent medical history including an exploratory laparotomy for lysis of  adhesions for small-bowel obstruction, ovarian cystectomy, an incidental  appendectomy by Dr. Anselm Pancoast. [place] in March of 2004.  The patient was  discharged home, December 02, 2002.  The patient did well for approximately six  weeks.  She had normal appetite.  She had resolution of her abdominal pain.  She had normal bowel function.  Over the past two weeks, the patient has  noted progressive constipation and development of abdominal pain.  She was  seen by Dr. Zachery Huffman and prescribed laxatives.  The patient's pain  persisted and became more severe.  Over the past 36 hours, the patient has  had onset of nausea and vomiting as well as persistent severe right mid-  abdominal pain.  The patient presented to the emergency department, where  she was evaluated by Dr. Russella Dar from the gastroenterology service and  admitted to [facility].  General surgery is now called  for consultation.   PAST MEDICAL HISTORY:  She is status post abdominal hysterectomy, status  post exploratory laparotomy with lysis of adhesions, appendectomy, and  ovarian cystectomy, March of 2004, by Dr. Zachery Huffman.   MEDICATIONS:   None.   ALLERGIES:  None known.   SOCIAL HISTORY:  The patient is married.  She works at [employer].  She does  not smoke.  She does not drink alcohol.   FAMILY HISTORY:  Family history notable for inflammatory bowel disease in a  second degree relative.   REVIEW OF SYSTEMS:  Fifteen-system review negative except as noted above.   PHYSICAL EXAMINATION:  GENERAL:  50-year-old well-developed, well-  nourished black female in no acute distress.  VITAL SIGNS:  Vital signs show temperature 97.0, pulse 84, respirations 20,  blood pressure 135/95.  HEENT:  HEENT shows her to be normocephalic.  Sclerae are clear.  Conjunctivae are clear.  Dentition is good.  Voice is normal.  NECK:  Neck is symmetric.  Thyroid is normal without nodularity.  There is  no anterior or posterior cervical adenopathy.  There are no supraclavicular  masses.  LUNGS:  Lungs are clear  to auscultation.  There is no costovertebral angle  tenderness.  CARDIAC:  Exam shows a regular rate and rhythm without murmur.  ABDOMEN:  Abdomen is soft without distention.  There are active bowel sounds  present.  There is tenderness to palpation, especially at the lower midline  incision and in the right mid-abdomen.  There are no palpable masses.  There  is no palpable hernia.  With Valsalva, there is no sign of incisional  hernia.  There is no erythema or drainage at the site of the incision.  There is no rebound tenderness.  EXTREMITIES:  Extremities are nontender without edema.  NEUROLOGIC:  Neurologically, the patient is alert and oriented without focal  deficit.   LABORATORY STUDIES:  White count 3.5 with the differential showing 36%  segmented neutrophils and 55% lymphocytes, hemoglobin 12.4.  Electrolytes  are normal.  Liver function tests are normal.  Lipase is normal at 19.   RADIOGRAPHIC STUDIES:  CT scan, abdomen and pelvis, discussed with Dr. Gordan Payment and Dr. Dellia Beckwith over the telephone.  By report,  this shows no  acute process.  Normal postoperative changes from her procedure in March are  noted.   Abdominal ultrasound was performed and reviewed with Dr. Gordan Payment.  This  shows no remaining sludge in the gallbladder and no gallstones.   IMPRESSION:  Abdominal pain of uncertain etiology, rule out partial small-  bowel obstruction versus viral gastroenteritis.   PLAN:  1. Agree with n.p.o. status.  2. Intravenous hydration.  3. Two-view abdominal x-ray and laboratory studies in a.m., February 04, 2003.  4. Will review abdominal CT to rule out the possibility of incisional     hernia.  5. Dr. Zachery Huffman will follow up on February 04, 2003.                                               [name], M.D.    TMG/MEDQ  D:  02/03/2003  T:  02/03/2003  Job:  [id number]   cc:   Venita Lick. Huffman Dar, M.D. [facility]

## 2011-01-20 NOTE — H&P (Signed)
[name], [name]                       ACCOUNT NO.:  [id number]   MEDICAL RECORD NO.:  [id number]                   PATIENT TYPE:  EMS   LOCATION:  ED                                   FACILITY:  [facility]   PHYSICIAN:  Malcolm T. Russella Dar, M.D. [facility]          DATE OF BIRTH:  May 02, 1961   DATE OF ADMISSION:  02/03/2003  DATE OF DISCHARGE:                                HISTORY & PHYSICAL   CHIEF COMPLAINT:  Severe abdominal pain.   HISTORY:  [name] is a 50 year old African-American female, status post  hysterectomy in 2001 for benign disease.  She has otherwise been generally  healthy.  She was admitted in March of 2004 with acute abdominal pain, and  CT at that time showed thickened distal small bowel and signs of a partial  obstruction; no definite transition point.  Small-bowel follow through  showed a narrowed distal 10 to 12 cm of the terminal ileum.  She failed  conservative management and then underwent a laparoscopy with lysis of  adhesions, laparotomy with lysis of adhesions, and incidental appendectomy  as well as drainage of an ovarian cyst with Dr. Zachery Dakins.  She did well  postoperatively and says that she had been doing fine until about two weeks  ago when she began with some mild right-sided abdominal pain; this has  persisted and progressed.  The patient describes it as a constant pain, now  more noticeable in the right upper quadrant with some radiation into her  back.  She says it is sharp and stabbing in nature and has been worse over  the past 36 hours.  She says she has been unable to eat or sleep and has  been pacing the floor all night, unable to find a comfortable position.  She  says it hurts to walk and that she has to bend over to ambulate.  She says  currently it hurts with any sort of movement.  She had come to the emergency  room on Jan 29, 2003, for less severe abdominal pain; abdominal films at  that time showed retained stool but no evidence of  obstruction.  She was  referred to primary care and also apparently spoke with Dr. Zachery Dakins, was  given laxatives and an enema with minimal results.  She has not had any  other bowel movements since until she took a suppository this morning and  had a small bowel movement.  She also took some Ex-Lax yesterday.  No fever  or chills.  No dysuria or hematuria.  She says she has had nausea, has been  unable to vomit.  She came back to the emergency room today with progressive  pain.  Plain abdominal films show a few dilated loops of small bowel,  question ileus.  Labs show WBC of 3.5, hemoglobin 12.4, hematocrit of 37.4,  MCV of 89, platelets 209, electrolytes within normal limits, LFTs normal,  lipase of 19, UA negative, urine  pregnancy test was also negative.   CURRENT MEDICATIONS:  None on a regular basis.  Denies any regular aspirin  or NSAIDs.   ALLERGIES:  No known drug allergies.   PAST MEDICAL HISTORY:  Benign.   PAST SURGICAL HISTORY:  1. She is status post total abdominal hysterectomy in 2001.  2. Had lysis of adhesions and appendectomy and drainage of left ovarian cyst     in March of 2004 for partial small-bowel obstruction.   FAMILY HISTORY:  One cousin with Crohn's disease.  There is no family  history of colon CA or polyps.   SOCIAL HISTORY:  The patient is married.  She is employed at [employer].  No  tobacco.  No ETOH.  Has two children.   REVIEW OF SYSTEMS:  CARDIOVASCULAR:  Denies any chest pain or anginal  symptoms.  PULMONARY:  Negative for cough, shortness of breath, or sputum  production.  GENITOURINARY:  Negative for dysuria, urgency, or frequency.  MUSCULOSKELETAL:  Negative.  GI:  As above.   PHYSICAL EXAMINATION:  GENERAL:  A well-developed African-American female  distressed secondary to pain, despite Dilaudid.  VITAL SIGNS:  Temp 97, blood pressure 132/77, pulse in the 60s.  HEENT:  Atraumatic.  Normocephalic.  EOMI.  PERLA.  Sclerae anicteric.  NECK:   Supple without nodes.  No JVD.  CARDIOVASCULAR:  Regular rate and rhythm with S1 and S2.  PULMONARY:  Clear to A&P.  ABDOMEN:  Somewhat distended.  Bowel sounds are positive, mildly  hyperactive.  She is diffusely tender, more marked in the right upper  quadrant with rebound rather diffusely.  There is no true guarding.  No mass  or hepatosplenomegaly.  She does have midline incisional scars, which are  benign.  RECTAL EXAM:  Heme-negative and without mass.  EXTREMITIES:  No clubbing, cyanosis, or edema.  NEURO:  Grossly nonfocal.   IMPRESSION:  1. A 50 year old African-American female with acute, progressive abdominal     pain, right greater than left, with recent lysis of adhesions and     appendectomy for partial small-bowel obstruction, rule out recurrent     bowel obstruction, rule out acute cholecystitis, rule out pain secondary     to ileus obstipation, though seems out of proportion.  2. Status post hysterectomy, 2001.   PLAN:  Patient is admitted to the service of Dr. Claudette Head for IV  hydration, pain control, bowel rest.  We will obtain STAT CT scan of the  abdomen and pelvis, and I suspect she will need surgical consultation.  We  will hold on antibiotics until CT results reviewed.     Amy Esterwood, P.A.-C. [facility]                Malcolm T. Russella Dar, M.D. [facility]    AE/MEDQ  D:  02/03/2003  T:  02/03/2003  Job:  [id number]   cc:   Loreen Freud, M.D.   Cecil Cranker, M.D.   [facility]   Anselm Pancoast. Zachery Dakins, M.D.  [address]  [place]  [place] [address]  Fax: [phone]

## 2011-01-20 NOTE — Discharge Summary (Signed)
[facility]  Patient:    [name], [name]                    MRN: [id number] Adm. Date:  [id number] Disc. Date: [id number] Attending:  Douglass Rivers                           Discharge Summary  PRINCIPAL DIAGNOSIS:  Submucosal fibroids.  PRINCIPAL PROCEDURES:  Total abdominal hysterectomy.  HISTORY OF PRESENT ILLNESS:  The patient is a 50 year old with a history of Submucosal fibroids and severe menorrhagia, failing conservative management, who presents for surgical management.  HOSPITAL COURSE:  The patient was admitted in the afternoon of May 14, 2000, and underwent a total abdominal hysterectomy under general anesthesia. Estimated blood loss was 100 cc.  Her operative findings consisted of grossly enlarged uterus with normal tubes and ovaries.  The patient tolerated the procedure well.  She was extubated in the OR and transferred to the PACU in stable condition up to the GYN floor in due fashion.  Her postoperative course was unremarkable.  She remained afebrile, and her vital signs remained stable. She was able to tolerate clear by postop day #1. By postop day #3, she had flatus and already had a regular diet.  She was ambulating without difficulty and was having minimal vaginal bleeding with ambulation.  Her postoperative CBC showed a white count of 8.6, hemoglobin 11.6, hematocrit 34.5, platelets 234.  The patient was discharged to home on postop day #3.  DISCHARGE MEDICATIONS:  She had been given prescriptions preoperatively for Tylox to use for pain.  She will use over-the-counter Motrin, and she will use milk of magnesia to keep her stools soft.  FOLLOWUP:  She will follow up in the office in two weeks. DD:  05/17/00 TD:  05/18/00 Job: [id number]

## 2011-01-20 NOTE — H&P (Signed)
[facility]  Patient:    [name], [name]                        MRN: [id number] Attending:  Douglass Rivers, M.D.                         History and Physical  CHIEF COMPLAINT:              Systematic fibroid uterus.  HISTORY OF PRESENT ILLNESS:   The patient is a 50 year old, G1, P1 with a history of menorrhagia with break through bleeding.  The patient states on her menses, she will change a pad every 45 minutes to one hour for three days and then will bleed for approximately four more days after that.  She had an ultrasound that was done that showed two submucous fibroids and on hysterogram confirmed that the two fibroids protrude into the cavity.  One measuring 2 x 2 x 2 and the second one measuring 3 x 3 x 3.  The patient had initially been treated with birth control pills which failed to control her bleeding.  PAST OB/GYN HISTORY:          Significant for menorrhagia with menses q.28 days with four to two weeks of bleeding.  Pap smears are normal.  Gonorrhea and Chlamydia cultures are negative.  She has had one child vaginally.  PAST MEDICAL HISTORY:         Negative.  PAST SURGICAL HISTORY:        She had arthroscopy on her right knee.  MEDICATIONS:                  Ortho-Novum 7/7/7.  ALLERGIES:                    None.  FAMILY HISTORY:               Negative for breast, ovarian, uterine or colon.  PHYSICAL EXAMINATION:  GENERAL:                      She is a well appearing female in no acute distress.  HEENT:                        Unremarkable.  NECK:                         Thyroid is nontender and mobile.  HEART:                        Regular rate.  LUNGS:                        Clear to auscultation.  BREASTS:                      Without mass, discharge or retractions in supine and upright position.  ABDOMEN:                      Obese, soft and nontender.  GYN:                          Normal external female genitalia.   The BUS is negative.  The vagina is pink and moist.  The  cervix is slightly enlarged, mobile and nontender.  No gross abnormalities are palpable.  Adnexae were not palpable.  EXTREMITIES:                  No clubbing, cyanosis or edema.  LABORATORY DATA:              TSH had been done which was normal at 0.64.  H&H of 12.1 and 36.6 and platelets 259.  Ultrasound of the uterus is 13.5 x 4.5 x 5.8 cm.  Right adnexa was negative.  The left had what was probably a functional cyst.  Hysterogram findings were as above.  ASSESSMENT:                   Menorrhagia, failure of conservative management                               with fibroid uterus.  PLAN:                         The patient will present today for an abdominal hysterectomy.  She has been counselled with regard to her other options which included a uterine artery embolization or hysteroscopic resection, more definitive surgery.  Risks of bleeding, infection and risks for transfusion were discussed and accepted for HIV, HVD and HBV were also accepted.  The risks of damage to the underlying bowel, bladder and ureters was also discussed.  All questions were addressed.  She will present on the morning of May 14, 2000 for an abdominal hysterectomy. DD:  05/14/00 TD:  05/14/00 Job: [id number]

## 2011-01-23 ENCOUNTER — Ambulatory Visit: Payer: BC Managed Care – PPO | Admitting: Physical Therapy

## 2011-01-25 ENCOUNTER — Ambulatory Visit: Payer: BC Managed Care – PPO

## 2011-02-01 ENCOUNTER — Encounter: Payer: BC Managed Care – PPO | Admitting: Physical Therapy

## 2011-02-02 ENCOUNTER — Ambulatory Visit: Payer: BC Managed Care – PPO

## 2011-02-06 ENCOUNTER — Ambulatory Visit: Payer: BC Managed Care – PPO | Attending: Orthopedic Surgery | Admitting: Physical Therapy

## 2011-02-06 DIAGNOSIS — M25619 Stiffness of unspecified shoulder, not elsewhere classified: Secondary | ICD-10-CM | POA: Insufficient documentation

## 2011-02-06 DIAGNOSIS — M25519 Pain in unspecified shoulder: Secondary | ICD-10-CM | POA: Insufficient documentation

## 2011-02-06 DIAGNOSIS — IMO0001 Reserved for inherently not codable concepts without codable children: Secondary | ICD-10-CM | POA: Insufficient documentation

## 2011-02-06 DIAGNOSIS — M6281 Muscle weakness (generalized): Secondary | ICD-10-CM | POA: Insufficient documentation

## 2011-02-08 ENCOUNTER — Ambulatory Visit: Payer: BC Managed Care – PPO

## 2011-02-13 ENCOUNTER — Ambulatory Visit: Payer: BC Managed Care – PPO | Admitting: Physical Therapy

## 2011-02-15 ENCOUNTER — Ambulatory Visit: Payer: BC Managed Care – PPO | Admitting: Physical Therapy

## 2011-02-20 ENCOUNTER — Encounter: Payer: BC Managed Care – PPO | Admitting: Physical Therapy

## 2011-02-22 ENCOUNTER — Encounter: Payer: BC Managed Care – PPO | Admitting: Physical Therapy

## 2011-03-23 ENCOUNTER — Encounter: Payer: Self-pay | Admitting: Family Medicine

## 2011-03-24 ENCOUNTER — Encounter: Payer: Self-pay | Admitting: Family Medicine

## 2011-03-24 ENCOUNTER — Other Ambulatory Visit (HOSPITAL_COMMUNITY)
Admission: RE | Admit: 2011-03-24 | Discharge: 2011-03-24 | Disposition: A | Discharge status: Home / Self Care | Payer: BC Managed Care – PPO | Source: Ambulatory Visit | Attending: Family Medicine | Admitting: Family Medicine

## 2011-03-24 ENCOUNTER — Ambulatory Visit (INDEPENDENT_AMBULATORY_CARE_PROVIDER_SITE_OTHER): Payer: BC Managed Care – PPO | Admitting: Family Medicine

## 2011-03-24 VITALS — BP 130/90 | HR 85 | Temp 98.2°F | Ht 65.0 in | Wt 198.4 lb

## 2011-03-24 DIAGNOSIS — Z01419 Encounter for gynecological examination (general) (routine) without abnormal findings: Secondary | ICD-10-CM | POA: Insufficient documentation

## 2011-03-24 DIAGNOSIS — Z0000 Encounter for general adult medical examination without abnormal findings: Secondary | ICD-10-CM

## 2011-03-24 NOTE — Progress Notes (Signed)
  Subjective:     [name] is a 50 y.o. female and is here for a comprehensive physical exam. The patient reports no problems.  History   Social History  . Marital Status: Divorced    Spouse Name: N/A    Number of Children: 1  . Years of Education: N/A   Occupational History  . MEAT [employer]   Social History Main Topics  . Smoking status: Never Smoker   . Smokeless tobacco: Not on file  . Alcohol Use: No  . Drug Use: No  . Sexually Active: Yes -- Female partner(s)   Other Topics Concern  . Not on file   Social History Narrative  . No narrative on file   Health Maintenance  Topic Date Due  . Colonoscopy  02/05/2010  . Influenza Vaccine  06/05/2011  . Pap Smear  06/18/2011  . Mammogram  02/08/2012  . Tetanus/tdap  12/17/2018    The following portions of the patient's history were reviewed and updated as appropriate: allergies, current medications, past family history, past medical history, past social history, past surgical history and problem list.  Review of Systems Review of Systems  Constitutional: Negative for activity change, appetite change and fatigue.  HENT: Negative for hearing loss, congestion, tinnitus and ear discharge.  dentist q31m Eyes: Negative for visual disturbance (see optho q1y -- vision corrected to 20/20 with glasses).  Respiratory: Negative for cough, chest tightness and shortness of breath.   Cardiovascular: Negative for chest pain, palpitations and leg swelling.  Gastrointestinal: Negative for abdominal pain, diarrhea, constipation and abdominal distention.  Genitourinary: Negative for urgency, frequency, decreased urine volume and difficulty urinating.  Musculoskeletal: Negative for back pain, arthralgias and gait problem.  Skin: Negative for color change, pallor and rash.  Neurological: Negative for dizziness, light-headedness, numbness and headaches.  Hematological: Negative for adenopathy. Does not bruise/bleed easily.    Psychiatric/Behavioral: Negative for suicidal ideas, confusion, sleep disturbance, self-injury, dysphoric mood, decreased concentration and agitation.       Objective:    BP 130/90  Pulse 85  Temp(Src) 98.2 F (36.8 C) (Oral)  Ht 5\' 5"  (1.651 m)  Wt 198 lb 6.4 oz (89.994 kg)  BMI 33.02 kg/m2  SpO2 97% General appearance: AAOx3  NAD Head: ATNC   Eyes: PERLA, EOMI Ears: TMI ,  Ext ear normal Nose: turb normal,   Throat: clear,  No errythema Neck: supple, no adenopathy Back: no scoliosis Lungs: CTAB/L  No RRW Breasts: no dimpling, no nipple d/c, no masses, no axillary nodes Heart: + S1S2  No murmur Abdomen: soft, nt, no organomegaly,  obese Pelvic: no ext lesions, no d/c, BME-no adenexal masses, cmt,  Pap done Extremities: no CCE Pulses: + pedal pulses equal and b/l,  + Post tibial pulses equal and b/l Skin: no rashes or suspicious lesions Lymph nodes: no abnormal lymph nodes Neurologic: CN 2-12 intact M/S-- no sensorymotor deficits   Assessment:    Healthy female exam.    Plan:    ghm utd Check labs See After Visit Summary for Counseling Recommendations

## 2011-03-24 NOTE — Patient Instructions (Signed)

## 2011-03-25 DIAGNOSIS — Z0000 Encounter for general adult medical examination without abnormal findings: Secondary | ICD-10-CM | POA: Insufficient documentation

## 2011-03-29 ENCOUNTER — Other Ambulatory Visit: Payer: Self-pay | Admitting: Family Medicine

## 2011-03-29 DIAGNOSIS — Z0000 Encounter for general adult medical examination without abnormal findings: Secondary | ICD-10-CM

## 2011-03-30 ENCOUNTER — Other Ambulatory Visit (INDEPENDENT_AMBULATORY_CARE_PROVIDER_SITE_OTHER): Payer: BC Managed Care – PPO

## 2011-03-30 DIAGNOSIS — Z0000 Encounter for general adult medical examination without abnormal findings: Secondary | ICD-10-CM

## 2011-03-30 LAB — POCT URINALYSIS DIPSTICK
Ketones, UA: NEGATIVE
Leukocytes, UA: NEGATIVE
Nitrite, UA: NEGATIVE
Protein, UA: NEGATIVE

## 2011-03-30 LAB — HEPATIC FUNCTION PANEL
ALT: 11 U/L (ref 0–35)
AST: 16 U/L (ref 0–37)
Alkaline Phosphatase: 70 U/L (ref 39–117)
Total Bilirubin: 0.4 mg/dL (ref 0.3–1.2)

## 2011-03-30 LAB — CBC WITH DIFFERENTIAL/PLATELET
Basophils Relative: 0.7 % (ref 0.0–3.0)
Eosinophils Absolute: 0 10*3/uL (ref 0.0–0.7)
Eosinophils Relative: 0.7 % (ref 0.0–5.0)
Lymphocytes Relative: 42.1 % (ref 12.0–46.0)
MCHC: 33.3 g/dL (ref 30.0–36.0)
Neutrophils Relative %: 49 % (ref 43.0–77.0)
RBC: 4.13 Mil/uL (ref 3.87–5.11)
WBC: 4.5 10*3/uL (ref 4.5–10.5)

## 2011-03-30 LAB — LIPID PANEL
HDL: 65.8 mg/dL (ref >39.00)
VLDL: 24.4 mg/dL (ref 0.0–40.0)

## 2011-03-30 LAB — BASIC METABOLIC PANEL
BUN: 11 mg/dL (ref 6–23)
Chloride: 101 mEq/L (ref 96–112)
Potassium: 4.5 mEq/L (ref 3.5–5.1)

## 2011-03-30 LAB — LDL CHOLESTEROL, DIRECT: Direct LDL: 128.2 mg/dL

## 2011-03-30 LAB — TSH: TSH: 0.73 u[IU]/mL (ref 0.35–5.50)

## 2011-03-30 NOTE — Progress Notes (Signed)
Labs only

## 2011-04-13 ENCOUNTER — Ambulatory Visit (AMBULATORY_SURGERY_CENTER): Payer: BC Managed Care – PPO | Admitting: *Deleted

## 2011-04-13 VITALS — Ht 65.0 in | Wt 199.9 lb

## 2011-04-13 DIAGNOSIS — Z1211 Encounter for screening for malignant neoplasm of colon: Secondary | ICD-10-CM

## 2011-04-13 MED ORDER — PEG-KCL-NACL-NASULF-NA ASC-C 100 G PO SOLR: ORAL | Status: DC

## 2011-04-18 ENCOUNTER — Telehealth: Payer: Self-pay | Admitting: Internal Medicine

## 2011-04-19 ENCOUNTER — Telehealth: Payer: Self-pay | Admitting: Internal Medicine

## 2011-04-19 ENCOUNTER — Encounter: Payer: Self-pay | Admitting: *Deleted

## 2011-04-19 NOTE — Telephone Encounter (Signed)
Pt picked up PEG 3350 prep from pharmacy and needs new prep instructions.  Pt will come 8/16 at 5:00 pm for me to review instructions for PEG 3350 split dose prep. Ezra Sites

## 2011-04-27 ENCOUNTER — Encounter: Payer: Self-pay | Admitting: Internal Medicine

## 2011-04-27 ENCOUNTER — Ambulatory Visit (AMBULATORY_SURGERY_CENTER): Payer: BC Managed Care – PPO | Admitting: Internal Medicine

## 2011-04-27 VITALS — BP 122/73 | HR 75 | Temp 99.3°F | Resp 20 | Ht 65.0 in | Wt 199.0 lb

## 2011-04-27 DIAGNOSIS — Z1211 Encounter for screening for malignant neoplasm of colon: Secondary | ICD-10-CM

## 2011-04-27 HISTORY — PX: COLONOSCOPY: SHX174

## 2011-04-27 MED ORDER — SODIUM CHLORIDE 0.9 % IV SOLN: 500.0000 mL | INTRAVENOUS | Status: DC

## 2011-04-27 NOTE — Patient Instructions (Signed)
Discharge instructions reviewed with patient and care partner.  Impressions/recommendations:  Diverticulosis (handout given)  High fiber diet handout given  Repeat exam in 10 years.  Continue medications as you were taking them prior to your procedure.

## 2011-04-28 ENCOUNTER — Telehealth: Payer: Self-pay | Admitting: *Deleted

## 2011-04-28 NOTE — Telephone Encounter (Signed)
Follow up Call- Patient questions:  Do you have a fever, pain , or abdominal swelling? no Pain Score  0 *  Have you tolerated food without any problems? yes  Have you been able to return to your normal activities? yes  Do you have any questions about your discharge instructions: Diet   no Medications  no Follow up visit  no  Do you have questions or concerns about your Care? no  Actions: * If pain score is 4 or above: No action needed, pain <4.  Spoke with patient's spouse. He states she went to work and had no problems.

## 2011-05-11 ENCOUNTER — Ambulatory Visit (INDEPENDENT_AMBULATORY_CARE_PROVIDER_SITE_OTHER): Payer: BC Managed Care – PPO | Admitting: Family Medicine

## 2011-05-11 ENCOUNTER — Encounter: Payer: Self-pay | Admitting: Family Medicine

## 2011-05-11 VITALS — BP 112/60 | Temp 100.0°F | Wt 190.8 lb

## 2011-05-11 DIAGNOSIS — J329 Chronic sinusitis, unspecified: Secondary | ICD-10-CM

## 2011-05-11 MED ORDER — PROMETHAZINE HCL 25 MG PO TABS: 25.0000 mg | ORAL_TABLET | Freq: Four times a day (QID) | ORAL | Status: AC | PRN

## 2011-05-11 MED ORDER — CLARITHROMYCIN ER 500 MG PO TB24: 1000.0000 mg | ORAL_TABLET | Freq: Every day | ORAL | Status: AC

## 2011-05-11 NOTE — Progress Notes (Signed)
  Subjective:    Patient ID: [name], female    DOB: Feb 02, 1961, 50 y.o.   MRN: [id number]  HPI Diarrhea- sxs started Sunday.  Having chills, HA.  + nausea, no vomiting.  Reports 'unable to eat anything'.  + fever, subjective.  Denies cough.  + sore throat, facial pressure, nasal congestion.  No ear pain.  No known sick contacts, went to [place] this weekend- sxs started on return trip.  Reports 2 stools daily- loose, not watery.  No blood.  Review of Systems For ROS see HPI     Objective:   Physical Exam  Constitutional: She appears well-developed and well-nourished. No distress.  HENT:  Head: Normocephalic and atraumatic.  Right Ear: Tympanic membrane normal.  Left Ear: Tympanic membrane normal.  Nose: Mucosal edema and rhinorrhea present. Right sinus exhibits maxillary sinus tenderness and frontal sinus tenderness. Left sinus exhibits maxillary sinus tenderness and frontal sinus tenderness.  Mouth/Throat: Uvula is midline and mucous membranes are normal. Posterior oropharyngeal erythema present. No oropharyngeal exudate.  Eyes: Conjunctivae and EOM are normal. Pupils are equal, round, and reactive to light.  Neck: Normal range of motion. Neck supple.  Cardiovascular: Normal rate, regular rhythm and normal heart sounds.   Pulmonary/Chest: Effort normal and breath sounds normal. No respiratory distress. She has no wheezes.  Abdominal: Soft. Bowel sounds are normal. She exhibits no distension. There is no tenderness. There is no rebound and no guarding.  Lymphadenopathy:    She has no cervical adenopathy.          Assessment & Plan:

## 2011-05-11 NOTE — Patient Instructions (Signed)
This appears to be a sinus infection Take the Biaxin as directed- take w/ food to avoid upset stomach Drink plenty of fluids REST! Use the phenergan as needed for nausea- this will make you sleepy If your symptoms change or worsen- please call or go to the ER (adominal pain, vomiting, etc) Hang in there!

## 2011-05-20 NOTE — Assessment & Plan Note (Signed)
Pt's PE and hx consistent w/ sinus infxn.  Start abx.  This is also likely cause of loose stools and nausea.  Phenergan prn.  Reviewed supportive care and red flags that should prompt return.  Pt expressed understanding and is in agreement w/ plan.

## 2011-06-12 LAB — POCT CARDIAC MARKERS: Myoglobin, poc: 55.9

## 2011-06-12 LAB — BASIC METABOLIC PANEL
BUN: 11
CO2: 27
Calcium: 9.5
Creatinine, Ser: 0.65
GFR calc non Af Amer: >60
Glucose, Bld: 77
Sodium: 142

## 2011-06-22 NOTE — Telephone Encounter (Signed)
Procedure completed

## 2012-07-11 ENCOUNTER — Ambulatory Visit (INDEPENDENT_AMBULATORY_CARE_PROVIDER_SITE_OTHER): Payer: BC Managed Care – PPO | Admitting: Gynecology

## 2012-07-11 ENCOUNTER — Encounter: Payer: Self-pay | Admitting: Gynecology

## 2012-07-11 VITALS — BP 130/80 | Ht 65.0 in | Wt 198.0 lb

## 2012-07-11 DIAGNOSIS — M1990 Unspecified osteoarthritis, unspecified site: Secondary | ICD-10-CM | POA: Insufficient documentation

## 2012-07-11 DIAGNOSIS — Z01419 Encounter for gynecological examination (general) (routine) without abnormal findings: Secondary | ICD-10-CM

## 2012-07-11 NOTE — Progress Notes (Signed)
Dafnee Brodhead 11-13-1960 [id number]        51 y.o.  G1P1001 new patient for annual exam.    Past medical history,surgical history, medications, allergies, family history and social history were all reviewed and documented in the EPIC chart. ROS:  Was performed and pertinent positives and negatives are included in the history.  Exam: Kim assistant Filed Vitals:   07/11/12 0907  BP: 130/80  Height: 5\' 5"  (1.651 m)  Weight: 198 lb (89.812 kg)   General appearance  Normal Skin grossly normal Head/Neck normal with no cervical or supraclavicular adenopathy thyroid normal Lungs  clear Cardiac RR, without RMG Abdominal  soft, nontender, without masses, organomegaly or hernia Breasts  examined lying and sitting without masses, retractions, discharge or axillary adenopathy. Pelvic  Ext/BUS/vagina  normal   Adnexa  Without masses or tenderness    Anus and perineum  normal   Rectovaginal  normal sphincter tone without palpated masses or tenderness.    Assessment/Plan:  51 y.o. G54P1001 female for annual exam.   1. TAH/BSO for leiomyoma 2001. No follow up HRT. Doing well without hot flushes sweats or other symptoms. We'll continue to follow with her choice. 2. Pap smear. No Pap smear done today. Last Pap smear 2012.  Discussed current screening guidelines. She is status post hysterectomy for benign indications with no history of abnormal Pap smears previously. Options to stop screening altogether or less frequent screening reviewed. We'll readdress annually. 3. Mammography. Patient due now and I reminded her to schedule this and she agrees to do so. SBE monthly reviewed. 4. DEXA 2011 normal. Recommend repeat at five-year interval. Increase calcium vitamin D reviewed. 5. Colonoscopy 2012. We'll follow up with their recommended interval. 6. Health maintenance. No blood work done this is all done through Dr. Ernst Spell office. Follow up one year, sooner as needed.   Dara Lords MD, 9:43  AM 07/11/2012

## 2012-07-11 NOTE — Patient Instructions (Addendum)
Follow up in one year for annual exam  Health Maintenance, Females A healthy lifestyle and preventative care can promote health and wellness.  Maintain regular health, dental, and eye exams.  Eat a healthy diet. Foods like vegetables, fruits, whole grains, low-fat dairy products, and lean protein foods contain the nutrients you need without too many calories. Decrease your intake of foods high in solid fats, added sugars, and salt. Get information about a proper diet from your caregiver, if necessary.  Regular physical exercise is one of the most important things you can do for your health. Most adults should get at least 150 minutes of moderate-intensity exercise (any activity that increases your heart rate and causes you to sweat) each week. In addition, most adults need muscle-strengthening exercises on 2 or more days a week.   Maintain a healthy weight. The body mass index (BMI) is a screening tool to identify possible weight problems. It provides an estimate of body fat based on height and weight. Your caregiver can help determine your BMI, and can help you achieve or maintain a healthy weight. For adults 20 years and older:  A BMI below 18.5 is considered underweight.  A BMI of 18.5 to 24.9 is normal.  A BMI of 25 to 29.9 is considered overweight.  A BMI of 30 and above is considered obese.  Maintain normal blood lipids and cholesterol by exercising and minimizing your intake of saturated fat. Eat a balanced diet with plenty of fruits and vegetables. Blood tests for lipids and cholesterol should begin at age 20 and be repeated every 5 years. If your lipid or cholesterol levels are high, you are over 50, or you are a high risk for heart disease, you may need your cholesterol levels checked more frequently.Ongoing high lipid and cholesterol levels should be treated with medicines if diet and exercise are not effective.  If you smoke, find out from your caregiver how to quit. If you do  not use tobacco, do not start.  If you are pregnant, do not drink alcohol. If you are breastfeeding, be very cautious about drinking alcohol. If you are not pregnant and choose to drink alcohol, do not exceed 1 drink per day. One drink is considered to be 12 ounces (355 mL) of beer, 5 ounces (148 mL) of wine, or 1.5 ounces (44 mL) of liquor.  Avoid use of street drugs. Do not share needles with anyone. Ask for help if you need support or instructions about stopping the use of drugs.  High blood pressure causes heart disease and increases the risk of stroke. Blood pressure should be checked at least every 1 to 2 years. Ongoing high blood pressure should be treated with medicines, if weight loss and exercise are not effective.  If you are 55 to 52 years old, ask your caregiver if you should take aspirin to prevent strokes.  Diabetes screening involves taking a blood sample to check your fasting blood sugar level. This should be done once every 3 years, after age 45, if you are within normal weight and without risk factors for diabetes. Testing should be considered at a younger age or be carried out more frequently if you are overweight and have at least 1 risk factor for diabetes.  Breast cancer screening is essential preventative care for women. You should practice "breast self-awareness." This means understanding the normal appearance and feel of your breasts and may include breast self-examination. Any changes detected, no matter how small, should be reported to a   caregiver. Women in their 20s and 30s should have a clinical breast exam (CBE) by a caregiver as part of a regular health exam every 1 to 3 years. After age 40, women should have a CBE every year. Starting at age 40, women should consider having a mammogram (breast X-ray) every year. Women who have a family history of breast cancer should talk to their caregiver about genetic screening. Women at a high risk of breast cancer should talk to  their caregiver about having an MRI and a mammogram every year.  The Pap test is a screening test for cervical cancer. Women should have a Pap test starting at age 21. Between ages 21 and 29, Pap tests should be repeated every 2 years. Beginning at age 30, you should have a Pap test every 3 years as long as the past 3 Pap tests have been normal. If you had a hysterectomy for a problem that was not cancer or a condition that could lead to cancer, then you no longer need Pap tests. If you are between ages 65 and 70, and you have had normal Pap tests going back 10 years, you no longer need Pap tests. If you have had past treatment for cervical cancer or a condition that could lead to cancer, you need Pap tests and screening for cancer for at least 20 years after your treatment. If Pap tests have been discontinued, risk factors (such as a new sexual partner) need to be reassessed to determine if screening should be resumed. Some women have medical problems that increase the chance of getting cervical cancer. In these cases, your caregiver may recommend more frequent screening and Pap tests.  The human papillomavirus (HPV) test is an additional test that may be used for cervical cancer screening. The HPV test looks for the virus that can cause the cell changes on the cervix. The cells collected during the Pap test can be tested for HPV. The HPV test could be used to screen women aged 30 years and older, and should be used in women of any age who have unclear Pap test results. After the age of 30, women should have HPV testing at the same frequency as a Pap test.  Colorectal cancer can be detected and often prevented. Most routine colorectal cancer screening begins at the age of 50 and continues through age 75. However, your caregiver may recommend screening at an earlier age if you have risk factors for colon cancer. On a yearly basis, your caregiver may provide home test kits to check for hidden blood in the  stool. Use of a small camera at the end of a tube, to directly examine the colon (sigmoidoscopy or colonoscopy), can detect the earliest forms of colorectal cancer. Talk to your caregiver about this at age 50, when routine screening begins. Direct examination of the colon should be repeated every 5 to 10 years through age 75, unless early forms of pre-cancerous polyps or small growths are found.  Hepatitis C blood testing is recommended for all people born from 1945 through 1965 and any individual with known risks for hepatitis C.  Practice safe sex. Use condoms and avoid high-risk sexual practices to reduce the spread of sexually transmitted infections (STIs). Sexually active women aged 25 and younger should be checked for Chlamydia, which is a common sexually transmitted infection. Older women with new or multiple partners should also be tested for Chlamydia. Testing for other STIs is recommended if you are sexually active and at increased   risk.  Osteoporosis is a disease in which the bones lose minerals and strength with aging. This can result in serious bone fractures. The risk of osteoporosis can be identified using a bone density scan. Women ages 65 and over and women at risk for fractures or osteoporosis should discuss screening with their caregivers. Ask your caregiver whether you should be taking a calcium supplement or vitamin D to reduce the rate of osteoporosis.  Menopause can be associated with physical symptoms and risks. Hormone replacement therapy is available to decrease symptoms and risks. You should talk to your caregiver about whether hormone replacement therapy is right for you.  Use sunscreen with a sun protection factor (SPF) of 30 or greater. Apply sunscreen liberally and repeatedly throughout the day. You should seek shade when your shadow is shorter than you. Protect yourself by wearing long sleeves, pants, a wide-brimmed hat, and sunglasses year round, whenever you are  outdoors.  Notify your caregiver of new moles or changes in moles, especially if there is a change in shape or color. Also notify your caregiver if a mole is larger than the size of a pencil eraser.  Stay current with your immunizations. Document Released: 03/06/2011 Document Revised: 11/13/2011 Document Reviewed: 03/06/2011 [employer] Patient Information 2013 [employer].  

## 2012-07-12 LAB — URINALYSIS W MICROSCOPIC + REFLEX CULTURE
Crystals: NONE SEEN
Glucose, UA: NEGATIVE mg/dL
Leukocytes, UA: NEGATIVE
Protein, ur: NEGATIVE mg/dL
Specific Gravity, Urine: 1.022 (ref 1.005–1.030)

## 2012-09-19 ENCOUNTER — Encounter: Payer: Self-pay | Admitting: Family Medicine

## 2012-09-19 ENCOUNTER — Ambulatory Visit (INDEPENDENT_AMBULATORY_CARE_PROVIDER_SITE_OTHER): Payer: BC Managed Care – PPO | Admitting: Family Medicine

## 2012-09-19 VITALS — BP 130/82 | HR 71 | Temp 98.4°F | Ht 65.5 in | Wt 200.0 lb

## 2012-09-19 DIAGNOSIS — Z1239 Encounter for other screening for malignant neoplasm of breast: Secondary | ICD-10-CM

## 2012-09-19 DIAGNOSIS — Z0000 Encounter for general adult medical examination without abnormal findings: Secondary | ICD-10-CM

## 2012-09-19 DIAGNOSIS — Z780 Asymptomatic menopausal state: Secondary | ICD-10-CM

## 2012-09-19 DIAGNOSIS — Z1231 Encounter for screening mammogram for malignant neoplasm of breast: Secondary | ICD-10-CM

## 2012-09-19 LAB — CBC WITH DIFFERENTIAL/PLATELET
Basophils Absolute: 0 10*3/uL (ref 0.0–0.1)
HCT: 40.7 % (ref 36.0–46.0)
Lymphocytes Relative: 48.4 % — ABNORMAL HIGH (ref 12.0–46.0)
Lymphs Abs: 2.2 10*3/uL (ref 0.7–4.0)
Monocytes Relative: 7.9 % (ref 3.0–12.0)
Platelets: 193 10*3/uL (ref 150.0–400.0)
RDW: 13.9 % (ref 11.5–14.6)

## 2012-09-19 LAB — BASIC METABOLIC PANEL
BUN: 11 mg/dL (ref 6–23)
Calcium: 9.2 mg/dL (ref 8.4–10.5)
GFR: 107.41 mL/min (ref >60.00)
Glucose, Bld: 97 mg/dL (ref 70–99)

## 2012-09-19 LAB — LIPID PANEL
Cholesterol: 245 mg/dL — ABNORMAL HIGH (ref 0–200)
HDL: 67.6 mg/dL (ref >39.00)
Total CHOL/HDL Ratio: 4
VLDL: 21.8 mg/dL (ref 0.0–40.0)

## 2012-09-19 LAB — POCT URINALYSIS DIPSTICK
Blood, UA: NEGATIVE
Glucose, UA: NEGATIVE
Nitrite, UA: NEGATIVE
Protein, UA: NEGATIVE
Urobilinogen, UA: 0.2
pH, UA: 6.5

## 2012-09-19 LAB — HEPATIC FUNCTION PANEL
AST: 19 U/L (ref 0–37)
Total Bilirubin: 0.8 mg/dL (ref 0.3–1.2)

## 2012-09-19 LAB — LDL CHOLESTEROL, DIRECT: Direct LDL: 136.6 mg/dL

## 2012-09-19 NOTE — Patient Instructions (Addendum)
Preventive Care for Adults, Female A healthy lifestyle and preventive care can promote health and wellness. Preventive health guidelines for women include the following key practices.  A routine yearly physical is a good way to check with your caregiver about your health and preventive screening. It is a chance to share any concerns and updates on your health, and to receive a thorough exam.  Visit your dentist for a routine exam and preventive care every 6 months. Brush your teeth twice a day and floss once a day. Good oral hygiene prevents tooth decay and gum disease.  The frequency of eye exams is based on your age, health, family medical history, use of contact lenses, and other factors. Follow your caregiver's recommendations for frequency of eye exams.  Eat a healthy diet. Foods like vegetables, fruits, whole grains, low-fat dairy products, and lean protein foods contain the nutrients you need without too many calories. Decrease your intake of foods high in solid fats, added sugars, and salt. Eat the right amount of calories for you.Get information about a proper diet from your caregiver, if necessary.  Regular physical exercise is one of the most important things you can do for your health. Most adults should get at least 150 minutes of moderate-intensity exercise (any activity that increases your heart rate and causes you to sweat) each week. In addition, most adults need muscle-strengthening exercises on 2 or more days a week.  Maintain a healthy weight. The body mass index (BMI) is a screening tool to identify possible weight problems. It provides an estimate of body fat based on height and weight. Your caregiver can help determine your BMI, and can help you achieve or maintain a healthy weight.For adults 20 years and older:  A BMI below 18.5 is considered underweight.  A BMI of 18.5 to 24.9 is normal.  A BMI of 25 to 29.9 is considered overweight.  A BMI of 30 and above is  considered obese.  Maintain normal blood lipids and cholesterol levels by exercising and minimizing your intake of saturated fat. Eat a balanced diet with plenty of fruit and vegetables. Blood tests for lipids and cholesterol should begin at age 20 and be repeated every 5 years. If your lipid or cholesterol levels are high, you are over 50, or you are at high risk for heart disease, you may need your cholesterol levels checked more frequently.Ongoing high lipid and cholesterol levels should be treated with medicines if diet and exercise are not effective.  If you smoke, find out from your caregiver how to quit. If you do not use tobacco, do not start.  If you are pregnant, do not drink alcohol. If you are breastfeeding, be very cautious about drinking alcohol. If you are not pregnant and choose to drink alcohol, do not exceed 1 drink per day. One drink is considered to be 12 ounces (355 mL) of beer, 5 ounces (148 mL) of wine, or 1.5 ounces (44 mL) of liquor.  Avoid use of street drugs. Do not share needles with anyone. Ask for help if you need support or instructions about stopping the use of drugs.  High blood pressure causes heart disease and increases the risk of stroke. Your blood pressure should be checked at least every 1 to 2 years. Ongoing high blood pressure should be treated with medicines if weight loss and exercise are not effective.  If you are 55 to 53 years old, ask your caregiver if you should take aspirin to prevent strokes.  Diabetes   screening involves taking a blood sample to check your fasting blood sugar level. This should be done once every 3 years, after age 45, if you are within normal weight and without risk factors for diabetes. Testing should be considered at a younger age or be carried out more frequently if you are overweight and have at least 1 risk factor for diabetes.  Breast cancer screening is essential preventive care for women. You should practice "breast  self-awareness." This means understanding the normal appearance and feel of your breasts and may include breast self-examination. Any changes detected, no matter how small, should be reported to a caregiver. Women in their 20s and 30s should have a clinical breast exam (CBE) by a caregiver as part of a regular health exam every 1 to 3 years. After age 40, women should have a CBE every year. Starting at age 40, women should consider having a mammography (breast X-ray test) every year. Women who have a family history of breast cancer should talk to their caregiver about genetic screening. Women at a high risk of breast cancer should talk to their caregivers about having magnetic resonance imaging (MRI) and a mammography every year.  The Pap test is a screening test for cervical cancer. A Pap test can show cell changes on the cervix that might become cervical cancer if left untreated. A Pap test is a procedure in which cells are obtained and examined from the lower end of the uterus (cervix).  Women should have a Pap test starting at age 21.  Between ages 21 and 29, Pap tests should be repeated every 2 years.  Beginning at age 30, you should have a Pap test every 3 years as long as the past 3 Pap tests have been normal.  Some women have medical problems that increase the chance of getting cervical cancer. Talk to your caregiver about these problems. It is especially important to talk to your caregiver if a new problem develops soon after your last Pap test. In these cases, your caregiver may recommend more frequent screening and Pap tests.  The above recommendations are the same for women who have or have not gotten the vaccine for human papillomavirus (HPV).  If you had a hysterectomy for a problem that was not cancer or a condition that could lead to cancer, then you no longer need Pap tests. Even if you no longer need a Pap test, a regular exam is a good idea to make sure no other problems are  starting.  If you are between ages 65 and 70, and you have had normal Pap tests going back 10 years, you no longer need Pap tests. Even if you no longer need a Pap test, a regular exam is a good idea to make sure no other problems are starting.  If you have had past treatment for cervical cancer or a condition that could lead to cancer, you need Pap tests and screening for cancer for at least 20 years after your treatment.  If Pap tests have been discontinued, risk factors (such as a new sexual partner) need to be reassessed to determine if screening should be resumed.  The HPV test is an additional test that may be used for cervical cancer screening. The HPV test looks for the virus that can cause the cell changes on the cervix. The cells collected during the Pap test can be tested for HPV. The HPV test could be used to screen women aged 30 years and older, and should   be used in women of any age who have unclear Pap test results. After the age of 30, women should have HPV testing at the same frequency as a Pap test.  Colorectal cancer can be detected and often prevented. Most routine colorectal cancer screening begins at the age of 50 and continues through age 75. However, your caregiver may recommend screening at an earlier age if you have risk factors for colon cancer. On a yearly basis, your caregiver may provide home test kits to check for hidden blood in the stool. Use of a small camera at the end of a tube, to directly examine the colon (sigmoidoscopy or colonoscopy), can detect the earliest forms of colorectal cancer. Talk to your caregiver about this at age 50, when routine screening begins. Direct examination of the colon should be repeated every 5 to 10 years through age 75, unless early forms of pre-cancerous polyps or small growths are found.  Hepatitis C blood testing is recommended for all people born from 1945 through 1965 and any individual with known risks for hepatitis C.  Practice  safe sex. Use condoms and avoid high-risk sexual practices to reduce the spread of sexually transmitted infections (STIs). STIs include gonorrhea, chlamydia, syphilis, trichomonas, herpes, HPV, and human immunodeficiency virus (HIV). Herpes, HIV, and HPV are viral illnesses that have no cure. They can result in disability, cancer, and death. Sexually active women aged 25 and younger should be checked for chlamydia. Older women with new or multiple partners should also be tested for chlamydia. Testing for other STIs is recommended if you are sexually active and at increased risk.  Osteoporosis is a disease in which the bones lose minerals and strength with aging. This can result in serious bone fractures. The risk of osteoporosis can be identified using a bone density scan. Women ages 65 and over and women at risk for fractures or osteoporosis should discuss screening with their caregivers. Ask your caregiver whether you should take a calcium supplement or vitamin D to reduce the rate of osteoporosis.  Menopause can be associated with physical symptoms and risks. Hormone replacement therapy is available to decrease symptoms and risks. You should talk to your caregiver about whether hormone replacement therapy is right for you.  Use sunscreen with sun protection factor (SPF) of 30 or more. Apply sunscreen liberally and repeatedly throughout the day. You should seek shade when your shadow is shorter than you. Protect yourself by wearing long sleeves, pants, a wide-brimmed hat, and sunglasses year round, whenever you are outdoors.  Once a month, do a whole body skin exam, using a mirror to look at the skin on your back. Notify your caregiver of new moles, moles that have irregular borders, moles that are larger than a pencil eraser, or moles that have changed in shape or color.  Stay current with required immunizations.  Influenza. You need a dose every fall (or winter). The composition of the flu vaccine  changes each year, so being vaccinated once is not enough.  Pneumococcal polysaccharide. You need 1 to 2 doses if you smoke cigarettes or if you have certain chronic medical conditions. You need 1 dose at age 65 (or older) if you have never been vaccinated.  Tetanus, diphtheria, pertussis (Tdap, Td). Get 1 dose of Tdap vaccine if you are younger than age 65, are over 65 and have contact with an infant, are a healthcare worker, are pregnant, or simply want to be protected from whooping cough. After that, you need a Td   booster dose every 10 years. Consult your caregiver if you have not had at least 3 tetanus and diphtheria-containing shots sometime in your life or have a deep or dirty wound.  HPV. You need this vaccine if you are a woman age 26 or younger. The vaccine is given in 3 doses over 6 months.  Measles, mumps, rubella (MMR). You need at least 1 dose of MMR if you were born in 1957 or later. You may also need a second dose.  Meningococcal. If you are age 19 to 21 and a first-year college student living in a residence hall, or have one of several medical conditions, you need to get vaccinated against meningococcal disease. You may also need additional booster doses.  Zoster (shingles). If you are age 60 or older, you should get this vaccine.  Varicella (chickenpox). If you have never had chickenpox or you were vaccinated but received only 1 dose, talk to your caregiver to find out if you need this vaccine.  Hepatitis A. You need this vaccine if you have a specific risk factor for hepatitis A virus infection or you simply wish to be protected from this disease. The vaccine is usually given as 2 doses, 6 to 18 months apart.  Hepatitis B. You need this vaccine if you have a specific risk factor for hepatitis B virus infection or you simply wish to be protected from this disease. The vaccine is given in 3 doses, usually over 6 months. Preventive Services / Frequency Ages 19 to 39  Blood  pressure check.** / Every 1 to 2 years.  Lipid and cholesterol check.** / Every 5 years beginning at age 20.  Clinical breast exam.** / Every 3 years for women in their 20s and 30s.  Pap test.** / Every 2 years from ages 21 through 29. Every 3 years starting at age 30 through age 65 or 70 with a history of 3 consecutive normal Pap tests.  HPV screening.** / Every 3 years from ages 30 through ages 65 to 70 with a history of 3 consecutive normal Pap tests.  Hepatitis C blood test.** / For any individual with known risks for hepatitis C.  Skin self-exam. / Monthly.  Influenza immunization.** / Every year.  Pneumococcal polysaccharide immunization.** / 1 to 2 doses if you smoke cigarettes or if you have certain chronic medical conditions.  Tetanus, diphtheria, pertussis (Tdap, Td) immunization. / A one-time dose of Tdap vaccine. After that, you need a Td booster dose every 10 years.  HPV immunization. / 3 doses over 6 months, if you are 26 and younger.  Measles, mumps, rubella (MMR) immunization. / You need at least 1 dose of MMR if you were born in 1957 or later. You may also need a second dose.  Meningococcal immunization. / 1 dose if you are age 19 to 21 and a first-year college student living in a residence hall, or have one of several medical conditions, you need to get vaccinated against meningococcal disease. You may also need additional booster doses.  Varicella immunization.** / Consult your caregiver.  Hepatitis A immunization.** / Consult your caregiver. 2 doses, 6 to 18 months apart.  Hepatitis B immunization.** / Consult your caregiver. 3 doses usually over 6 months. Ages 40 to 64  Blood pressure check.** / Every 1 to 2 years.  Lipid and cholesterol check.** / Every 5 years beginning at age 20.  Clinical breast exam.** / Every year after age 40.  Mammogram.** / Every year beginning at age 40   and continuing for as long as you are in good health. Consult with your  caregiver.  Pap test.** / Every 3 years starting at age 30 through age 65 or 70 with a history of 3 consecutive normal Pap tests.  HPV screening.** / Every 3 years from ages 30 through ages 65 to 70 with a history of 3 consecutive normal Pap tests.  Fecal occult blood test (FOBT) of stool. / Every year beginning at age 50 and continuing until age 75. You may not need to do this test if you get a colonoscopy every 10 years.  Flexible sigmoidoscopy or colonoscopy.** / Every 5 years for a flexible sigmoidoscopy or every 10 years for a colonoscopy beginning at age 50 and continuing until age 75.  Hepatitis C blood test.** / For all people born from 1945 through 1965 and any individual with known risks for hepatitis C.  Skin self-exam. / Monthly.  Influenza immunization.** / Every year.  Pneumococcal polysaccharide immunization.** / 1 to 2 doses if you smoke cigarettes or if you have certain chronic medical conditions.  Tetanus, diphtheria, pertussis (Tdap, Td) immunization.** / A one-time dose of Tdap vaccine. After that, you need a Td booster dose every 10 years.  Measles, mumps, rubella (MMR) immunization. / You need at least 1 dose of MMR if you were born in 1957 or later. You may also need a second dose.  Varicella immunization.** / Consult your caregiver.  Meningococcal immunization.** / Consult your caregiver.  Hepatitis A immunization.** / Consult your caregiver. 2 doses, 6 to 18 months apart.  Hepatitis B immunization.** / Consult your caregiver. 3 doses, usually over 6 months. Ages 65 and over  Blood pressure check.** / Every 1 to 2 years.  Lipid and cholesterol check.** / Every 5 years beginning at age 20.  Clinical breast exam.** / Every year after age 40.  Mammogram.** / Every year beginning at age 40 and continuing for as long as you are in good health. Consult with your caregiver.  Pap test.** / Every 3 years starting at age 30 through age 65 or 70 with a 3  consecutive normal Pap tests. Testing can be stopped between 65 and 70 with 3 consecutive normal Pap tests and no abnormal Pap or HPV tests in the past 10 years.  HPV screening.** / Every 3 years from ages 30 through ages 65 or 70 with a history of 3 consecutive normal Pap tests. Testing can be stopped between 65 and 70 with 3 consecutive normal Pap tests and no abnormal Pap or HPV tests in the past 10 years.  Fecal occult blood test (FOBT) of stool. / Every year beginning at age 50 and continuing until age 75. You may not need to do this test if you get a colonoscopy every 10 years.  Flexible sigmoidoscopy or colonoscopy.** / Every 5 years for a flexible sigmoidoscopy or every 10 years for a colonoscopy beginning at age 50 and continuing until age 75.  Hepatitis C blood test.** / For all people born from 1945 through 1965 and any individual with known risks for hepatitis C.  Osteoporosis screening.** / A one-time screening for women ages 65 and over and women at risk for fractures or osteoporosis.  Skin self-exam. / Monthly.  Influenza immunization.** / Every year.  Pneumococcal polysaccharide immunization.** / 1 dose at age 65 (or older) if you have never been vaccinated.  Tetanus, diphtheria, pertussis (Tdap, Td) immunization. / A one-time dose of Tdap vaccine if you are over   65 and have contact with an infant, are a healthcare worker, or simply want to be protected from whooping cough. After that, you need a Td booster dose every 10 years.  Varicella immunization.** / Consult your caregiver.  Meningococcal immunization.** / Consult your caregiver.  Hepatitis A immunization.** / Consult your caregiver. 2 doses, 6 to 18 months apart.  Hepatitis B immunization.** / Check with your caregiver. 3 doses, usually over 6 months. ** Family history and personal history of risk and conditions may change your caregiver's recommendations. Document Released: 10/17/2001 Document Revised: 11/13/2011  Document Reviewed: 01/16/2011 [employer] Patient Information 2013 [employer].  

## 2012-09-19 NOTE — Progress Notes (Signed)
Subjective:     [name] is a 52 y.o. female and is here for a comprehensive physical exam. The patient reports no problems.  History   Social History  . Marital Status: Divorced    Spouse Name: N/A    Number of Children: 1  . Years of Education: N/A   Occupational History  . [employer]   Social History Main Topics  . Smoking status: Never Smoker   . Smokeless tobacco: Never Used  . Alcohol Use: No  . Drug Use: No  . Sexually Active: Yes -- Female partner(s)    Birth Control/ Protection: Surgical   Other Topics Concern  . Not on file   Social History Narrative  . No narrative on file   Health Maintenance  Topic Date Due  . Mammogram  02/08/2012  . Pap Smear  03/23/2014  . Tetanus/tdap  12/17/2018  . Colonoscopy  04/26/2021  . Influenza Vaccine  05/05/2012    The following portions of the patient's history were reviewed and updated as appropriate:  She  has a past medical history of Arthritis. She  does not have any pertinent problems on file. She  has past surgical history that includes obstructed bile duct (2004); Abdominal hysterectomy (2001); and Rotator cuff repair (12/2010). Her family history includes Arthritis in her mother and sister; Heart disease in her sister; Heart disease (age of onset:57) in her mother; Hypertension in her father and mother; and Stroke in her mother.  There is no history of Colon cancer and Colon polyps. She  reports that she has never smoked. She has never used smokeless tobacco. She reports that she does not drink alcohol or use illicit drugs. She currently has no medications in their medication list. No current outpatient prescriptions on file prior to visit.   She  has no known allergies..  Review of Systems Review of Systems  Constitutional: Negative for activity change, appetite change and fatigue.  HENT: Negative for hearing loss, congestion, tinnitus and ear discharge.  dentist q84m Eyes: Negative for visual  disturbance (see optho q1y -- vision corrected  with contacts --not 20/20) Respiratory: Negative for cough, chest tightness and shortness of breath.   Cardiovascular: Negative for chest pain, palpitations and leg swelling.  Gastrointestinal: Negative for abdominal pain, diarrhea, constipation and abdominal distention.  Genitourinary: Negative for urgency, frequency, decreased urine volume and difficulty urinating.  Musculoskeletal: Negative for back pain, arthralgias and gait problem.  Skin: Negative for color change, pallor and rash.  Neurological: Negative for dizziness, light-headedness, numbness and headaches.  Hematological: Negative for adenopathy. Does not bruise/bleed easily.  Psychiatric/Behavioral: Negative for suicidal ideas, confusion, sleep disturbance, self-injury, dysphoric mood, decreased concentration and agitation.       Objective:    BP 130/82  Pulse 71  Temp 98.4 F (36.9 C) (Oral)  Ht 5' 5.5" (1.664 m)  Wt 200 lb (90.719 kg)  BMI 32.78 kg/m2  SpO2 96% General appearance: alert, cooperative, appears stated age and no distress Head: Normocephalic, without obvious abnormality, atraumatic Eyes: perla, + lazy eye Ears: normal TM's and external ear canals both ears Nose: Nares normal. Septum midline. Mucosa normal. No drainage or sinus tenderness. Throat: lips, mucosa, and tongue normal; teeth and gums normal Neck: no adenopathy, no carotid bruit, no JVD, supple, symmetrical, trachea midline and thyroid not enlarged, symmetric, no tenderness/mass/nodules Back: symmetric, no curvature. ROM normal. No CVA tenderness. Lungs: clear to auscultation bilaterally Breasts: gyn Heart: regular rate and rhythm, S1, S2  normal, no murmur, click, rub or gallop Abdomen: soft, non-tender; bowel sounds normal; no masses,  no organomegaly Pelvic: deferred--gyn Extremities: extremities normal, atraumatic, no cyanosis or edema Pulses: 2+ and symmetric Skin: Skin color, texture,  turgor normal. No rashes or lesions Lymph nodes: Cervical, supraclavicular, and axillary nodes normal. Neurologic: Alert and oriented X 3, normal strength and tone. Normal symmetric reflexes. Normal coordination and gait psych-- no anxiety / depression    Assessment:    Healthy female exam.      Plan:    ghm utd Check labs See After Visit Summary for Counseling Recommendations

## 2012-09-24 ENCOUNTER — Encounter: Payer: BC Managed Care – PPO | Admitting: Family Medicine

## 2012-10-23 ENCOUNTER — Ambulatory Visit
Admission: RE | Admit: 2012-10-23 | Discharge: 2012-10-23 | Disposition: A | Discharge status: Home / Self Care | Payer: BC Managed Care – PPO | Source: Ambulatory Visit | Attending: Family Medicine | Admitting: Family Medicine

## 2012-10-23 DIAGNOSIS — Z780 Asymptomatic menopausal state: Secondary | ICD-10-CM

## 2012-10-23 DIAGNOSIS — Z1231 Encounter for screening mammogram for malignant neoplasm of breast: Secondary | ICD-10-CM

## 2012-11-07 ENCOUNTER — Telehealth: Payer: Self-pay | Admitting: Family Medicine

## 2012-11-07 ENCOUNTER — Telehealth: Payer: Self-pay | Admitting: *Deleted

## 2012-11-07 DIAGNOSIS — M1990 Unspecified osteoarthritis, unspecified site: Secondary | ICD-10-CM

## 2012-11-07 NOTE — Telephone Encounter (Signed)
Pt requesting referral to Dr. Dierdre Forth. See previous phone note.

## 2012-11-07 NOTE — Telephone Encounter (Signed)
Please advise      KP 

## 2012-11-07 NOTE — Telephone Encounter (Signed)
If she means a rheumatologist--- Dr Dierdre Forth

## 2012-11-07 NOTE — Telephone Encounter (Signed)
Pt left VM wanting to know the physician that was discuss for her arthritis.Please advise

## 2012-11-07 NOTE — Telephone Encounter (Signed)
See previous phone note.  

## 2012-11-07 NOTE — Telephone Encounter (Signed)
Discuss with patient  

## 2012-11-12 ENCOUNTER — Telehealth: Payer: Self-pay | Admitting: Family Medicine

## 2012-11-12 NOTE — Telephone Encounter (Signed)
The referral has bene put in a few days ago.     KP

## 2012-11-12 NOTE — Telephone Encounter (Signed)
Patient would like a referral to Dr. Alben Deeds, rheumatologist.

## 2012-11-26 ENCOUNTER — Encounter: Payer: Self-pay | Admitting: Family Medicine

## 2013-06-05 ENCOUNTER — Telehealth: Payer: Self-pay | Admitting: Family Medicine

## 2013-06-05 NOTE — Telephone Encounter (Signed)
Called pt to schedule sx clearance appt. LVM.

## 2013-06-06 NOTE — Telephone Encounter (Signed)
Called patient to schedule pre-operative clearance. No answer. LVM

## 2013-06-09 ENCOUNTER — Ambulatory Visit (INDEPENDENT_AMBULATORY_CARE_PROVIDER_SITE_OTHER): Payer: BC Managed Care – PPO | Admitting: Family Medicine

## 2013-06-09 ENCOUNTER — Encounter: Payer: Self-pay | Admitting: Family Medicine

## 2013-06-09 VITALS — BP 120/78 | Temp 98.4°F | Wt 204.0 lb

## 2013-06-09 DIAGNOSIS — R5381 Other malaise: Secondary | ICD-10-CM

## 2013-06-09 DIAGNOSIS — E669 Obesity, unspecified: Secondary | ICD-10-CM | POA: Insufficient documentation

## 2013-06-09 DIAGNOSIS — Z01818 Encounter for other preprocedural examination: Secondary | ICD-10-CM

## 2013-06-09 DIAGNOSIS — G4700 Insomnia, unspecified: Secondary | ICD-10-CM

## 2013-06-09 MED ORDER — ZOLPIDEM TARTRATE 5 MG PO TABS: 5.0000 mg | ORAL_TABLET | Freq: Every evening | ORAL | Status: DC | PRN

## 2013-06-09 NOTE — Progress Notes (Signed)
Subjective:    [name] is a 52 y.o. female who presents to the office today for a preoperative consultation at the request of surgeon Ranell Patrick who plans on performing Left shoulder surgery on December 2014. This consultation is requested for the specific conditions prompting preoperative evaluation (i.e. because of potential affect on operative risk): age. Planned anesthesia: general. The patient has the following known anesthesia issues: none. Patients bleeding risk: no recent abnormal bleeding. Patient does not have objections to receiving blood products if needed.  The following portions of the patient's history were reviewed and updated as appropriate:  She  has a past medical history of Arthritis. She  does not have any pertinent problems on file. She  has past surgical history that includes obstructed bile duct (2004); Abdominal hysterectomy (2001); and Rotator cuff repair (12/2010). Her family history includes Arthritis in her mother and sister; Heart disease in her sister; Heart disease (age of onset: 41) in her mother; Hypertension in her father and mother; Stroke in her mother. There is no history of Colon cancer or Colon polyps. She  reports that she has never smoked. She has never used smokeless tobacco. She reports that she does not drink alcohol or use illicit drugs. She currently has no medications in their medication list. No current outpatient prescriptions on file prior to visit.   No current facility-administered medications on file prior to visit.   She has No Known Allergies..  Review of Systems Pertinent items are noted in HPI.    Objective:    BP 120/78  Temp(Src) 98.4 F (36.9 C) (Oral)  Wt 204 lb (92.534 kg)  BMI 33.42 kg/m2 General appearance: alert, cooperative, appears stated age and no distress Head: Normocephalic, without obvious abnormality, atraumatic Eyes: conjunctivae/corneas clear. PERRL, EOM's intact. Fundi benign. Ears: normal TM's and  external ear canals both ears Nose: Nares normal. Septum midline. Mucosa normal. No drainage or sinus tenderness. Throat: lips, mucosa, and tongue normal; teeth and gums normal Neck: no adenopathy, no carotid bruit, no JVD, supple, symmetrical, trachea midline and thyroid not enlarged, symmetric, no tenderness/mass/nodules Back: symmetric, no curvature. ROM normal. No CVA tenderness. Lungs: clear to auscultation bilaterally Heart: regular rate and rhythm, S1, S2 normal, no murmur, click, rub or gallop Abdomen: soft, non-tender; bowel sounds normal; no masses,  no organomegaly Extremities: L shoulder pain with dec rom Pulses: 2+ and symmetric Skin: Skin color, texture, turgor normal. No rashes or lesions Lymph nodes: Cervical, supraclavicular, and axillary nodes normal. Neurologic: Alert and oriented X 3, normal strength and tone. Normal symmetric reflexes. Normal coordination and gait  Predictors of intubation difficulty:  Morbid obesity? yes -   Anatomically abnormal facies? no  Prominent incisors? no  Receding mandible? no  Short, thick neck? no  Neck range of motion: normal       Cardiographics ECG: normal sinus rhythm, no blocks or conduction defects, no ischemic changes Echocardiogram: not done  Imaging Chest x-ray: not done   Lab Review  No visits with results within 6 Month(s) from this visit. Latest known visit with results is:  Office Visit on 09/19/2012  Component Date Value  . Sodium 09/19/2012 134*  . Potassium 09/19/2012 3.5   . Chloride 09/19/2012 100   . CO2 09/19/2012 27   . Glucose, Bld 09/19/2012 97   . BUN 09/19/2012 11   . Creatinine, Ser 09/19/2012 0.7   . Calcium 09/19/2012 9.2   . GFR 09/19/2012 107.41   . WBC 09/19/2012 4.5   .  RBC 09/19/2012 4.47   . Hemoglobin 09/19/2012 13.5   . HCT 09/19/2012 40.7   . MCV 09/19/2012 91.1   . MCHC 09/19/2012 33.2   . RDW 09/19/2012 13.9   . Platelets 09/19/2012 193.0   . Neutrophils Relative %  09/19/2012 42.9*  . Lymphocytes Relative 09/19/2012 48.4*  . Monocytes Relative 09/19/2012 7.9   . Eosinophils Relative 09/19/2012 0.4   . Basophils Relative 09/19/2012 0.4   . Neutro Abs 09/19/2012 1.9   . Lymphs Abs 09/19/2012 2.2   . Monocytes Absolute 09/19/2012 0.4   . Eosinophils Absolute 09/19/2012 0.0   . Basophils Absolute 09/19/2012 0.0   . Total Bilirubin 09/19/2012 0.8   . Bilirubin, Direct 09/19/2012 0.0   . Alkaline Phosphatase 09/19/2012 71   . AST 09/19/2012 19   . ALT 09/19/2012 15   . Total Protein 09/19/2012 7.6   . Albumin 09/19/2012 4.2   . Cholesterol 09/19/2012 245*  . Triglycerides 09/19/2012 109.0   . HDL 09/19/2012 67.60   . VLDL 09/19/2012 21.8   . Total CHOL/HDL Ratio 09/19/2012 4   . Color, UA 09/19/2012 yellow   . Clarity, UA 09/19/2012 clear   . Glucose, UA 09/19/2012 Neg   . Bilirubin, UA 09/19/2012 Neg   . Ketones, UA 09/19/2012 Neg   . Spec Grav, UA 09/19/2012 <=1.005   . Blood, UA 09/19/2012 Neg   . pH, UA 09/19/2012 6.5   . Protein, UA 09/19/2012 Neg   . Urobilinogen, UA 09/19/2012 0.2   . Nitrite, UA 09/19/2012 Neg   . Leukocytes, UA 09/19/2012 Negative   . TSH 09/19/2012 0.65   . Direct LDL 09/19/2012 136.6       Assessment:      52 y.o. female with planned surgery as above.   Known risk factors for perioperative complications: None    Cardiac Risk Estimation: low  Current medications which may produce withdrawal symptoms if withheld perioperatively: na      Plan:    1. Preoperative workup as follows none. 2. Change in medication regimen before surgery: none, continue medication regimen including morning of surgery, with sip of water. 3. Prophylaxis for cardiac events with perioperative beta-blockers: not indicated. 4. Invasive hemodynamic monitoring perioperatively: not indicated. 5. Deep vein thrombosis prophylaxis postoperatively:regimen to be chosen by surgical team. 6. Surveillance for postoperative MI with ECG  immediately postoperatively and on postoperative days 1 and 2 AND troponin levels 24 hours postoperatively and on day 4 or hospital discharge (whichever comes first): at the discretion of anesthesiologist.

## 2013-06-09 NOTE — Patient Instructions (Signed)

## 2013-06-10 LAB — CBC WITH DIFFERENTIAL/PLATELET
Basophils Absolute: 0 10*3/uL (ref 0.0–0.1)
Basophils Relative: 0.3 % (ref 0.0–3.0)
Eosinophils Absolute: 0 10*3/uL (ref 0.0–0.7)
Lymphocytes Relative: 52.9 % — ABNORMAL HIGH (ref 12.0–46.0)
MCHC: 33.2 g/dL (ref 30.0–36.0)
MCV: 89.4 fl (ref 78.0–100.0)
Monocytes Absolute: 0.4 10*3/uL (ref 0.1–1.0)
Neutrophils Relative %: 37.9 % — ABNORMAL LOW (ref 43.0–77.0)
RBC: 4.12 Mil/uL (ref 3.87–5.11)
RDW: 13.5 % (ref 11.5–14.6)

## 2013-06-10 LAB — BASIC METABOLIC PANEL
BUN: 15 mg/dL (ref 6–23)
CO2: 30 mEq/L (ref 19–32)
Calcium: 9.3 mg/dL (ref 8.4–10.5)
Chloride: 103 mEq/L (ref 96–112)
Creatinine, Ser: 0.7 mg/dL (ref 0.4–1.2)
Glucose, Bld: 80 mg/dL (ref 70–99)

## 2013-06-10 LAB — TSH: TSH: 1 u[IU]/mL (ref 0.35–5.50)

## 2013-07-14 ENCOUNTER — Ambulatory Visit (INDEPENDENT_AMBULATORY_CARE_PROVIDER_SITE_OTHER): Payer: BC Managed Care – PPO | Admitting: Gynecology

## 2013-07-14 ENCOUNTER — Encounter: Payer: Self-pay | Admitting: Gynecology

## 2013-07-14 VITALS — BP 116/70 | Ht 65.0 in | Wt 200.0 lb

## 2013-07-14 DIAGNOSIS — Z113 Encounter for screening for infections with a predominantly sexual mode of transmission: Secondary | ICD-10-CM

## 2013-07-14 DIAGNOSIS — Z01419 Encounter for gynecological examination (general) (routine) without abnormal findings: Secondary | ICD-10-CM

## 2013-07-14 NOTE — Progress Notes (Signed)
[name] [birth year] [id number]        52 y.o.  G1P1001 for annual exam.  Several issues noted below.  Past medical history,surgical history, problem list, medications, allergies, family history and social history were all reviewed and documented in the EPIC chart.  ROS:  Performed and pertinent positives and negatives are included in the history, assessment and plan .  Exam: Kim assistant Filed Vitals:   07/14/13 1047  BP: 116/70  Height: 5\' 5"  (1.651 m)  Weight: 200 lb (90.719 kg)   General appearance  Normal Skin grossly normal Head/Neck normal with no cervical or supraclavicular adenopathy thyroid normal Lungs  clear Cardiac RR, without RMG Abdominal  soft, nontender, without masses, organomegaly or hernia Breasts  examined lying and sitting without masses, retractions, discharge or axillary adenopathy. Pelvic  Ext/BUS/vagina  normal  Adnexa  Without masses or tenderness    Anus and perineum  normal   Rectovaginal  normal sphincter tone without palpated masses or tenderness.    Assessment/Plan:  52 y.o. G64P1001 female for annual exam.   1. Status post TAH/BSO 2001 for leiomyoma. Doing well without significant hot flashes night sweats vaginal dryness or dyspareunia. We'll continue to monitor. 2. STD screening. Patient has no known exposure but asked for STD screening. GC chlamydia of the vaginal cuff/urethra, HIV, RPR, hepatitis B, hepatitis C done. 3. Pap smear 2012. No Pap smear done today. No history of abnormal Pap smears previously. I again reviewed options to stop screening altogether she is status post hysterectomy for benign indications versus less frequent screening intervals. Will readdress on an annual basis. 4. Mammography 10/2012. Continue with annual mammography. SBE monthly review. 5. DEXA 2014 normal. She has several normal DEXAs. Recommended she repeat at age 38. Increase calcium vitamin D reviewed. 6. Colonoscopy 2012. Repeat at their recommended  interval. 7. Health maintenance. Recently had full blood work done through her primary physician office. Followup one year, sooner as needed.  Note: This document was prepared with digital dictation and possible smart phrase technology. Any transcriptional errors that result from this process are unintentional.   Dara Lords MD, 11:03 AM 07/14/2013

## 2013-07-14 NOTE — Patient Instructions (Signed)
Follow up in one year for annual exam 

## 2013-07-15 LAB — HIV ANTIBODY (ROUTINE TESTING W REFLEX): HIV: NONREACTIVE

## 2013-07-15 LAB — HEPATITIS C ANTIBODY: HCV Ab: NEGATIVE

## 2013-08-04 ENCOUNTER — Telehealth: Payer: Self-pay | Admitting: *Deleted

## 2013-08-04 NOTE — Telephone Encounter (Signed)
Pt called requesting to pick up copy of 07/14/13 lab results. Copy left up front for pick up.

## 2013-09-18 ENCOUNTER — Encounter: Payer: Self-pay | Admitting: Family Medicine

## 2013-09-18 ENCOUNTER — Ambulatory Visit (INDEPENDENT_AMBULATORY_CARE_PROVIDER_SITE_OTHER): Payer: BC Managed Care – PPO | Admitting: Family Medicine

## 2013-09-18 VITALS — BP 126/86 | HR 100 | Temp 98.6°F | Resp 16 | Wt 195.4 lb

## 2013-09-18 DIAGNOSIS — J329 Chronic sinusitis, unspecified: Secondary | ICD-10-CM

## 2013-09-18 MED ORDER — PROMETHAZINE-DM 6.25-15 MG/5ML PO SYRP: 5.0000 mL | ORAL_SOLUTION | Freq: Four times a day (QID) | ORAL | Status: DC | PRN

## 2013-09-18 MED ORDER — AMOXICILLIN 875 MG PO TABS: 875.0000 mg | ORAL_TABLET | Freq: Two times a day (BID) | ORAL | Status: DC

## 2013-09-18 NOTE — Progress Notes (Signed)
Pre visit review using our clinic review tool, if applicable. No additional management support is needed unless otherwise documented below in the visit note. 

## 2013-09-18 NOTE — Patient Instructions (Signed)
Follow up as needed Start the Amoxicillin twice daily- take w/ food Drink plenty of fluids Use the cough syrup as needed-will cause drowsiness Mucinex DM for daytime cough (available over the counter) REST! Call with any questions or concerns Hang in there!!

## 2013-09-18 NOTE — Assessment & Plan Note (Signed)
Pt's sxs and PE consistent w/ infxn.  Start abx.  Cough meds prn.  Reviewed supportive care and red flags that should prompt return.  Pt expressed understanding and is in agreement w/ plan.  

## 2013-09-18 NOTE — Progress Notes (Signed)
   Subjective:    Patient ID: [name], female    DOB: 25-Aug-1961, 53 y.o.   MRN: [id number]  HPI URI- sore throat, chest congestion, HA, + sinus pain/pressure.  + nausea.  Subjective fever on Tuesday.  + fatigue, 'i'm so weak'.  No ear pain.  No known sick contacts.  + body aches   Review of Systems For ROS see HPI     Objective:   Physical Exam  Vitals reviewed. Constitutional: She appears well-developed and well-nourished. No distress.  HENT:  Head: Normocephalic and atraumatic.  Right Ear: Tympanic membrane normal.  Left Ear: Tympanic membrane normal.  Nose: Mucosal edema and rhinorrhea present. Right sinus exhibits maxillary sinus tenderness and frontal sinus tenderness. Left sinus exhibits maxillary sinus tenderness and frontal sinus tenderness.  Mouth/Throat: Uvula is midline and mucous membranes are normal. Posterior oropharyngeal erythema present. No oropharyngeal exudate.  Eyes: Conjunctivae and EOM are normal. Pupils are equal, round, and reactive to light.  Neck: Normal range of motion. Neck supple.  Cardiovascular: Normal rate, regular rhythm and normal heart sounds.   Pulmonary/Chest: Effort normal and breath sounds normal. No respiratory distress. She has no wheezes.  Lymphadenopathy:    She has no cervical adenopathy.          Assessment & Plan:

## 2014-07-06 ENCOUNTER — Encounter: Payer: Self-pay | Admitting: Family Medicine

## 2014-07-15 ENCOUNTER — Encounter: Payer: Self-pay | Admitting: Gynecology

## 2014-07-15 ENCOUNTER — Other Ambulatory Visit (HOSPITAL_COMMUNITY)
Admission: RE | Admit: 2014-07-15 | Discharge: 2014-07-15 | Disposition: A | Discharge status: Home / Self Care | Payer: BC Managed Care – PPO | Source: Ambulatory Visit | Attending: Gynecology | Admitting: Gynecology

## 2014-07-15 ENCOUNTER — Ambulatory Visit (INDEPENDENT_AMBULATORY_CARE_PROVIDER_SITE_OTHER): Payer: BC Managed Care – PPO | Admitting: Gynecology

## 2014-07-15 VITALS — BP 124/78 | Ht 65.0 in | Wt 186.0 lb

## 2014-07-15 DIAGNOSIS — Z01419 Encounter for gynecological examination (general) (routine) without abnormal findings: Secondary | ICD-10-CM | POA: Insufficient documentation

## 2014-07-15 DIAGNOSIS — N393 Stress incontinence (female) (male): Secondary | ICD-10-CM

## 2014-07-15 DIAGNOSIS — N907 Vulvar cyst: Secondary | ICD-10-CM

## 2014-07-15 LAB — CBC WITH DIFFERENTIAL/PLATELET
BASOS ABS: 0 10*3/uL (ref 0.0–0.1)
BASOS PCT: 0 % (ref 0–1)
Eosinophils Absolute: 0 10*3/uL (ref 0.0–0.7)
Eosinophils Relative: 1 % (ref 0–5)
HEMATOCRIT: 37.9 % (ref 36.0–46.0)
Hemoglobin: 12.7 g/dL (ref 12.0–15.0)
LYMPHS PCT: 48 % — AB (ref 12–46)
Lymphs Abs: 2 10*3/uL (ref 0.7–4.0)
MCH: 29.3 pg (ref 26.0–34.0)
MCHC: 33.5 g/dL (ref 30.0–36.0)
MCV: 87.3 fL (ref 78.0–100.0)
Monocytes Absolute: 0.2 10*3/uL (ref 0.1–1.0)
Monocytes Relative: 6 % (ref 3–12)
NEUTROS ABS: 1.8 10*3/uL (ref 1.7–7.7)
NEUTROS PCT: 45 % (ref 43–77)
PLATELETS: 222 10*3/uL (ref 150–400)
RBC: 4.34 MIL/uL (ref 3.87–5.11)
RDW: 14.3 % (ref 11.5–15.5)
WBC: 4.1 10*3/uL (ref 4.0–10.5)

## 2014-07-15 LAB — LIPID PANEL
CHOL/HDL RATIO: 3.6 ratio
Cholesterol: 247 mg/dL — ABNORMAL HIGH (ref 0–200)
HDL: 69 mg/dL (ref >39)
LDL CALC: 155 mg/dL — AB (ref 0–99)
Triglycerides: 114 mg/dL (ref <150)
VLDL: 23 mg/dL (ref 0–40)

## 2014-07-15 LAB — COMPREHENSIVE METABOLIC PANEL
ALBUMIN: 4.3 g/dL (ref 3.5–5.2)
ALK PHOS: 83 U/L (ref 39–117)
ALT: 8 U/L (ref 0–35)
AST: 16 U/L (ref 0–37)
BUN: 14 mg/dL (ref 6–23)
CALCIUM: 9.7 mg/dL (ref 8.4–10.5)
CHLORIDE: 102 meq/L (ref 96–112)
CO2: 24 mEq/L (ref 19–32)
Creat: 0.78 mg/dL (ref 0.50–1.10)
Glucose, Bld: 89 mg/dL (ref 70–99)
POTASSIUM: 4 meq/L (ref 3.5–5.3)
Sodium: 139 mEq/L (ref 135–145)
Total Bilirubin: 0.5 mg/dL (ref 0.2–1.2)
Total Protein: 7.1 g/dL (ref 6.0–8.3)

## 2014-07-15 LAB — TSH: TSH: 0.874 u[IU]/mL (ref 0.350–4.500)

## 2014-07-15 NOTE — Addendum Note (Signed)
Addended by: Joaquin Music on: 07/15/2014 11:42 AM   Modules accepted: Orders

## 2014-07-15 NOTE — Addendum Note (Signed)
Addended by: Nelva Nay on: 07/15/2014 10:09 AM   Modules accepted: Orders, SmartSet

## 2014-07-15 NOTE — Patient Instructions (Addendum)
Schedule your mammogram.  Follow up in one year for annual exam, sooner as needed.  You may obtain a copy of any labs that were done today by logging onto MyChart as outlined in the instructions provided with your AVS (after visit summary). The office will not call with normal lab results but certainly if there are any significant abnormalities then we will contact you.   Health Maintenance, Female A healthy lifestyle and preventative care can promote health and wellness.  Maintain regular health, dental, and eye exams.  Eat a healthy diet. Foods like vegetables, fruits, whole grains, low-fat dairy products, and lean protein foods contain the nutrients you need without too many calories. Decrease your intake of foods high in solid fats, added sugars, and salt. Get information about a proper diet from your caregiver, if necessary.  Regular physical exercise is one of the most important things you can do for your health. Most adults should get at least 150 minutes of moderate-intensity exercise (any activity that increases your heart rate and causes you to sweat) each week. In addition, most adults need muscle-strengthening exercises on 2 or more days a week.   Maintain a healthy weight. The body mass index (BMI) is a screening tool to identify possible weight problems. It provides an estimate of body fat based on height and weight. Your caregiver can help determine your BMI, and can help you achieve or maintain a healthy weight. For adults 20 years and older:  A BMI below 18.5 is considered underweight.  A BMI of 18.5 to 24.9 is normal.  A BMI of 25 to 29.9 is considered overweight.  A BMI of 30 and above is considered obese.  Maintain normal blood lipids and cholesterol by exercising and minimizing your intake of saturated fat. Eat a balanced diet with plenty of fruits and vegetables. Blood tests for lipids and cholesterol should begin at age 72 and be repeated every 5 years. If your lipid  or cholesterol levels are high, you are over 50, or you are a high risk for heart disease, you may need your cholesterol levels checked more frequently.Ongoing high lipid and cholesterol levels should be treated with medicines if diet and exercise are not effective.  If you smoke, find out from your caregiver how to quit. If you do not use tobacco, do not start.  Lung cancer screening is recommended for adults aged 46 80 years who are at high risk for developing lung cancer because of a history of smoking. Yearly low-dose computed tomography (CT) is recommended for people who have at least a 30-pack-year history of smoking and are a current smoker or have quit within the past 15 years. A pack year of smoking is smoking an average of 1 pack of cigarettes a day for 1 year (for example: 1 pack a day for 30 years or 2 packs a day for 15 years). Yearly screening should continue until the smoker has stopped smoking for at least 15 years. Yearly screening should also be stopped for people who develop a health problem that would prevent them from having lung cancer treatment.  If you are pregnant, do not drink alcohol. If you are breastfeeding, be very cautious about drinking alcohol. If you are not pregnant and choose to drink alcohol, do not exceed 1 drink per day. One drink is considered to be 12 ounces (355 mL) of beer, 5 ounces (148 mL) of wine, or 1.5 ounces (44 mL) of liquor.  Avoid use of street drugs. Do  not share needles with anyone. Ask for help if you need support or instructions about stopping the use of drugs.  High blood pressure causes heart disease and increases the risk of stroke. Blood pressure should be checked at least every 1 to 2 years. Ongoing high blood pressure should be treated with medicines, if weight loss and exercise are not effective.  If you are 26 to 54 years old, ask your caregiver if you should take aspirin to prevent strokes.  Diabetes screening involves taking a blood  sample to check your fasting blood sugar level. This should be done once every 3 years, after age 65, if you are within normal weight and without risk factors for diabetes. Testing should be considered at a younger age or be carried out more frequently if you are overweight and have at least 1 risk factor for diabetes.  Breast cancer screening is essential preventative care for women. You should practice "breast self-awareness." This means understanding the normal appearance and feel of your breasts and may include breast self-examination. Any changes detected, no matter how small, should be reported to a caregiver. Women in their 83s and 30s should have a clinical breast exam (CBE) by a caregiver as part of a regular health exam every 1 to 3 years. After age 1, women should have a CBE every year. Starting at age 65, women should consider having a mammogram (breast X-ray) every year. Women who have a family history of breast cancer should talk to their caregiver about genetic screening. Women at a high risk of breast cancer should talk to their caregiver about having an MRI and a mammogram every year.  Breast cancer gene (BRCA)-related cancer risk assessment is recommended for women who have family members with BRCA-related cancers. BRCA-related cancers include breast, ovarian, tubal, and peritoneal cancers. Having family members with these cancers may be associated with an increased risk for harmful changes (mutations) in the breast cancer genes BRCA1 and BRCA2. Results of the assessment will determine the need for genetic counseling and BRCA1 and BRCA2 testing.  The Pap test is a screening test for cervical cancer. Women should have a Pap test starting at age 72. Between ages 60 and 46, Pap tests should be repeated every 2 years. Beginning at age 4, you should have a Pap test every 3 years as long as the past 3 Pap tests have been normal. If you had a hysterectomy for a problem that was not cancer or a  condition that could lead to cancer, then you no longer need Pap tests. If you are between ages 18 and 98, and you have had normal Pap tests going back 10 years, you no longer need Pap tests. If you have had past treatment for cervical cancer or a condition that could lead to cancer, you need Pap tests and screening for cancer for at least 20 years after your treatment. If Pap tests have been discontinued, risk factors (such as a new sexual partner) need to be reassessed to determine if screening should be resumed. Some women have medical problems that increase the chance of getting cervical cancer. In these cases, your caregiver may recommend more frequent screening and Pap tests.  The human papillomavirus (HPV) test is an additional test that may be used for cervical cancer screening. The HPV test looks for the virus that can cause the cell changes on the cervix. The cells collected during the Pap test can be tested for HPV. The HPV test could be used to  screen women aged 39 years and older, and should be used in women of any age who have unclear Pap test results. After the age of 23, women should have HPV testing at the same frequency as a Pap test.  Colorectal cancer can be detected and often prevented. Most routine colorectal cancer screening begins at the age of 59 and continues through age 66. However, your caregiver may recommend screening at an earlier age if you have risk factors for colon cancer. On a yearly basis, your caregiver may provide home test kits to check for hidden blood in the stool. Use of a small camera at the end of a tube, to directly examine the colon (sigmoidoscopy or colonoscopy), can detect the earliest forms of colorectal cancer. Talk to your caregiver about this at age 25, when routine screening begins. Direct examination of the colon should be repeated every 5 to 10 years through age 62, unless early forms of pre-cancerous polyps or small growths are found.  Hepatitis C blood  testing is recommended for all people born from [birth year] through 1965 and any individual with known risks for hepatitis C.  Practice safe sex. Use condoms and avoid high-risk sexual practices to reduce the spread of sexually transmitted infections (STIs). Sexually active women aged 11 and younger should be checked for Chlamydia, which is a common sexually transmitted infection. Older women with new or multiple partners should also be tested for Chlamydia. Testing for other STIs is recommended if you are sexually active and at increased risk.  Osteoporosis is a disease in which the bones lose minerals and strength with aging. This can result in serious bone fractures. The risk of osteoporosis can be identified using a bone density scan. Women ages 30 and over and women at risk for fractures or osteoporosis should discuss screening with their caregivers. Ask your caregiver whether you should be taking a calcium supplement or vitamin D to reduce the rate of osteoporosis.  Menopause can be associated with physical symptoms and risks. Hormone replacement therapy is available to decrease symptoms and risks. You should talk to your caregiver about whether hormone replacement therapy is right for you.  Use sunscreen. Apply sunscreen liberally and repeatedly throughout the day. You should seek shade when your shadow is shorter than you. Protect yourself by wearing long sleeves, pants, a wide-brimmed hat, and sunglasses year round, whenever you are outdoors.  Notify your caregiver of new moles or changes in moles, especially if there is a change in shape or color. Also notify your caregiver if a mole is larger than the size of a pencil eraser.  Stay current with your immunizations. Document Released: 03/06/2011 Document Revised: 12/16/2012 Document Reviewed: 03/06/2011 [facility] Patient Information 2014 [place].

## 2014-07-15 NOTE — Progress Notes (Signed)
[name] May 27, 1961 [id number]        53 y.o.  G1P1001 for annual exam.  Several issues noted below.  Past medical history,surgical history, problem list, medications, allergies, family history and social history were all reviewed and documented as reviewed in the EPIC chart.  ROS:  12 system ROS performed with pertinent positives and negatives included in the history, assessment and plan.   Additional significant findings :  none   Exam: [name] Vitals:   07/15/14 0930  BP: 124/78  Height: 5\' 5"  (1.651 m)  Weight: 186 lb (84.369 kg)   General appearance:  Normal affect, orientation and appearance. Skin: Grossly normal HEENT: Without gross lesions.  No cervical or supraclavicular adenopathy. Thyroid normal.  Lungs:  Clear without wheezing, rales or rhonchi Cardiac: RR, without RMG Abdominal:  Soft, nontender, without masses, guarding, rebound, organomegaly or hernia Breasts:  Examined lying and sitting without masses, retractions, discharge or axillary adenopathy. Pelvic:  Ext/BUS/vagina with generalized atrophic changes. 2 small classic sebaceous cysts as diagrammed below on left labia majora. Pap of cuff done. Physical Exam  Genitourinary:        Adnexa  Without masses or tenderness    Anus and perineum  Normal   Rectovaginal  Normal sphincter tone without palpated masses or tenderness.    Assessment/Plan:  53 y.o. G58P1001 female for annual exam.   1. Status post TAH/BSO 2001 for leiomyoma. Doing well without significant symptoms of hot flashes, night sweats, vaginal dryness or dyspareunia. Will continue to monitor. 2. Two classic sebaceous cysts left labia majora. Patient has noticed them but they do not bother her.  Classic in appearance. Recommend monitoring and following up with a change/enlarge or become painful. 3. Urinary incontinence, stress historically. Loss of urine with sneezing, coughing or laughing. Is trying the new support tampon product.  She is going to call me and let me know how this works. Options to include more frequent voiding, Kegel exercises and surgery reviewed. Patient is not interested in surgery and will try the other options.  Check urinalysis today. 4. Pap smear 2012. Pap of vaginal cuff today. No history of significant abnormal Pap smears. Options to stop screening as she is status post hysterectomy for benign indications versus screening on a less frequent interval reviewed. Will rediscuss on an annual basis. 5. Mammography 10/2012. Patient knows she is overdue and agrees to schedule. SBE monthly reviewed. 6. DEXA 2014 normal. Several prior studies also normal. We'll plan repeat DEXA at age 24. Increase calcium vitamin D reviewed. Check vitamin D level today. 7. Colonoscopy 2012. Repeat at their recommended interval. 8. Health maintenance. Patient asked if I would do her routine blood work today. CBC comprehensive metabolic panel lipid profile urinalysis TSH vitamin D ordered. Follow up in one year, sooner as needed.     Audrey Auerbach MD, 9:59 AM 07/15/2014

## 2014-07-16 LAB — URINALYSIS W MICROSCOPIC + REFLEX CULTURE
BACTERIA UA: NONE SEEN
BILIRUBIN URINE: NEGATIVE
CASTS: NONE SEEN
Crystals: NONE SEEN
Glucose, UA: NEGATIVE mg/dL
HGB URINE DIPSTICK: NEGATIVE
KETONES UR: NEGATIVE mg/dL
Leukocytes, UA: NEGATIVE
NITRITE: NEGATIVE
PH: 6 (ref 5.0–8.0)
Protein, ur: NEGATIVE mg/dL
Specific Gravity, Urine: 1.019 (ref 1.005–1.030)
Squamous Epithelial / LPF: NONE SEEN
Urobilinogen, UA: 0.2 mg/dL (ref 0.0–1.0)

## 2014-07-16 LAB — VITAMIN D 25 HYDROXY (VIT D DEFICIENCY, FRACTURES): VIT D 25 HYDROXY: 13 ng/mL — AB (ref 30–89)

## 2014-07-17 ENCOUNTER — Other Ambulatory Visit: Payer: Self-pay | Admitting: Gynecology

## 2014-07-17 DIAGNOSIS — E559 Vitamin D deficiency, unspecified: Secondary | ICD-10-CM

## 2014-07-17 LAB — CYTOLOGY - PAP

## 2014-07-17 MED ORDER — VITAMIN D (ERGOCALCIFEROL) 1.25 MG (50000 UNIT) PO CAPS: 50000.0000 [IU] | ORAL_CAPSULE | ORAL | Status: DC

## 2014-09-30 ENCOUNTER — Telehealth: Payer: Self-pay | Admitting: *Deleted

## 2014-09-30 ENCOUNTER — Encounter: Payer: Self-pay | Admitting: *Deleted

## 2014-09-30 NOTE — Telephone Encounter (Addendum)
Pre-Visit Call:   Pap- 07/15/14- normal (with GYN- Dr. Donalynn Furlong) CCS- 04/27/11- diverticulosis, otherwise normal f/u 10 years- [facility]- 10/23/12- BI- RADS Category 2- Benign findings, at [place] [facility]- 10/23/12- T-Score (total) 1.1, Z-Score (total) 1.1 at [place] [facility]- would like at office visit Td- 12/16/08  No concerns or questions at this time. Preferred pharmacy: [id number] - [place], [place] - [address]

## 2014-09-30 NOTE — Telephone Encounter (Signed)
Chart updated with patient .

## 2014-09-30 NOTE — Addendum Note (Signed)
Addended by: Leticia Penna A on: 09/30/2014 04:55 PM   Modules accepted: Medications

## 2014-10-01 ENCOUNTER — Ambulatory Visit (INDEPENDENT_AMBULATORY_CARE_PROVIDER_SITE_OTHER): Payer: BLUE CROSS/BLUE SHIELD | Admitting: Family Medicine

## 2014-10-01 ENCOUNTER — Other Ambulatory Visit (HOSPITAL_COMMUNITY)
Admission: RE | Admit: 2014-10-01 | Discharge: 2014-10-01 | Disposition: A | Discharge status: Home / Self Care | Payer: BLUE CROSS/BLUE SHIELD | Source: Ambulatory Visit | Attending: Family Medicine | Admitting: Family Medicine

## 2014-10-01 ENCOUNTER — Encounter: Payer: Self-pay | Admitting: Family Medicine

## 2014-10-01 VITALS — BP 130/76 | HR 73 | Temp 98.2°F | Ht 65.0 in | Wt 185.2 lb

## 2014-10-01 DIAGNOSIS — N760 Acute vaginitis: Secondary | ICD-10-CM | POA: Insufficient documentation

## 2014-10-01 DIAGNOSIS — M159 Polyosteoarthritis, unspecified: Secondary | ICD-10-CM

## 2014-10-01 DIAGNOSIS — Z0000 Encounter for general adult medical examination without abnormal findings: Secondary | ICD-10-CM

## 2014-10-01 DIAGNOSIS — Z113 Encounter for screening for infections with a predominantly sexual mode of transmission: Secondary | ICD-10-CM | POA: Insufficient documentation

## 2014-10-01 DIAGNOSIS — E559 Vitamin D deficiency, unspecified: Secondary | ICD-10-CM

## 2014-10-01 DIAGNOSIS — Z23 Encounter for immunization: Secondary | ICD-10-CM

## 2014-10-01 LAB — POCT URINALYSIS DIPSTICK
Blood, UA: NEGATIVE
GLUCOSE UA: NEGATIVE
Ketones, UA: NEGATIVE
LEUKOCYTES UA: NEGATIVE
Nitrite, UA: NEGATIVE
Protein, UA: NEGATIVE
Spec Grav, UA: 1.025
UROBILINOGEN UA: NEGATIVE
pH, UA: 6

## 2014-10-01 MED ORDER — CELECOXIB 200 MG PO CAPS
200.0000 mg | ORAL_CAPSULE | Freq: Every day | ORAL | Status: DC
Start: 2014-10-01 — End: 2015-02-16

## 2014-10-01 NOTE — Patient Instructions (Signed)
Preventive Care for Adults A healthy lifestyle and preventive care can promote health and wellness. Preventive health guidelines for women include the following key practices.  A routine yearly physical is a good way to check with your health care provider about your health and preventive screening. It is a chance to share any concerns and updates on your health and to receive a thorough exam.  Visit your dentist for a routine exam and preventive care every 6 months. Brush your teeth twice a day and floss once a day. Good oral hygiene prevents tooth decay and gum disease.  The frequency of eye exams is based on your age, health, family medical history, use of contact lenses, and other factors. Follow your health care provider's recommendations for frequency of eye exams.  Eat a healthy diet. Foods like vegetables, fruits, whole grains, low-fat dairy products, and lean protein foods contain the nutrients you need without too many calories. Decrease your intake of foods high in solid fats, added sugars, and salt. Eat the right amount of calories for you.Get information about a proper diet from your health care provider, if necessary.  Regular physical exercise is one of the most important things you can do for your health. Most adults should get at least 150 minutes of moderate-intensity exercise (any activity that increases your heart rate and causes you to sweat) each week. In addition, most adults need muscle-strengthening exercises on 2 or more days a week.  Maintain a healthy weight. The body mass index (BMI) is a screening tool to identify possible weight problems. It provides an estimate of body fat based on height and weight. Your health care provider can find your BMI and can help you achieve or maintain a healthy weight.For adults 20 years and older:  A BMI below 18.5 is considered underweight.  A BMI of 18.5 to 24.9 is normal.  A BMI of 25 to 29.9 is considered overweight.  A BMI of  30 and above is considered obese.  Maintain normal blood lipids and cholesterol levels by exercising and minimizing your intake of saturated fat. Eat a balanced diet with plenty of fruit and vegetables. Blood tests for lipids and cholesterol should begin at age 76 and be repeated every 5 years. If your lipid or cholesterol levels are high, you are over 50, or you are at high risk for heart disease, you may need your cholesterol levels checked more frequently.Ongoing high lipid and cholesterol levels should be treated with medicines if diet and exercise are not working.  If you smoke, find out from your health care provider how to quit. If you do not use tobacco, do not start.  Lung cancer screening is recommended for adults aged 22-80 years who are at high risk for developing lung cancer because of a history of smoking. A yearly low-dose CT scan of the lungs is recommended for people who have at least a 30-pack-year history of smoking and are a current smoker or have quit within the past 15 years. A pack year of smoking is smoking an average of 1 pack of cigarettes a day for 1 year (for example: 1 pack a day for 30 years or 2 packs a day for 15 years). Yearly screening should continue until the smoker has stopped smoking for at least 15 years. Yearly screening should be stopped for people who develop a health problem that would prevent them from having lung cancer treatment.  If you are pregnant, do not drink alcohol. If you are breastfeeding,  be very cautious about drinking alcohol. If you are not pregnant and choose to drink alcohol, do not have more than 1 drink per day. One drink is considered to be 12 ounces (355 mL) of beer, 5 ounces (148 mL) of wine, or 1.5 ounces (44 mL) of liquor.  Avoid use of street drugs. Do not share needles with anyone. Ask for help if you need support or instructions about stopping the use of drugs.  High blood pressure causes heart disease and increases the risk of  stroke. Your blood pressure should be checked at least every 1 to 2 years. Ongoing high blood pressure should be treated with medicines if weight loss and exercise do not work.  If you are 3-86 years old, ask your health care provider if you should take aspirin to prevent strokes.  Diabetes screening involves taking a blood sample to check your fasting blood sugar level. This should be done once every 3 years, after age 67, if you are within normal weight and without risk factors for diabetes. Testing should be considered at a younger age or be carried out more frequently if you are overweight and have at least 1 risk factor for diabetes.  Breast cancer screening is essential preventive care for women. You should practice "breast self-awareness." This means understanding the normal appearance and feel of your breasts and may include breast self-examination. Any changes detected, no matter how small, should be reported to a health care provider. Women in their 8s and 30s should have a clinical breast exam (CBE) by a health care provider as part of a regular health exam every 1 to 3 years. After age 70, women should have a CBE every year. Starting at age 25, women should consider having a mammogram (breast X-ray test) every year. Women who have a family history of breast cancer should talk to their health care provider about genetic screening. Women at a high risk of breast cancer should talk to their health care providers about having an MRI and a mammogram every year.  Breast cancer gene (BRCA)-related cancer risk assessment is recommended for women who have family members with BRCA-related cancers. BRCA-related cancers include breast, ovarian, tubal, and peritoneal cancers. Having family members with these cancers may be associated with an increased risk for harmful changes (mutations) in the breast cancer genes BRCA1 and BRCA2. Results of the assessment will determine the need for genetic counseling and  BRCA1 and BRCA2 testing.  Routine pelvic exams to screen for cancer are no longer recommended for nonpregnant women who are considered low risk for cancer of the pelvic organs (ovaries, uterus, and vagina) and who do not have symptoms. Ask your health care provider if a screening pelvic exam is right for you.  If you have had past treatment for cervical cancer or a condition that could lead to cancer, you need Pap tests and screening for cancer for at least 20 years after your treatment. If Pap tests have been discontinued, your risk factors (such as having a new sexual partner) need to be reassessed to determine if screening should be resumed. Some women have medical problems that increase the chance of getting cervical cancer. In these cases, your health care provider may recommend more frequent screening and Pap tests.  The HPV test is an additional test that may be used for cervical cancer screening. The HPV test looks for the virus that can cause the cell changes on the cervix. The cells collected during the Pap test can be  tested for HPV. The HPV test could be used to screen women aged 30 years and older, and should be used in women of any age who have unclear Pap test results. After the age of 30, women should have HPV testing at the same frequency as a Pap test.  Colorectal cancer can be detected and often prevented. Most routine colorectal cancer screening begins at the age of 50 years and continues through age 75 years. However, your health care provider may recommend screening at an earlier age if you have risk factors for colon cancer. On a yearly basis, your health care provider may provide home test kits to check for hidden blood in the stool. Use of a small camera at the end of a tube, to directly examine the colon (sigmoidoscopy or colonoscopy), can detect the earliest forms of colorectal cancer. Talk to your health care provider about this at age 50, when routine screening begins. Direct  exam of the colon should be repeated every 5-10 years through age 75 years, unless early forms of pre-cancerous polyps or small growths are found.  People who are at an increased risk for hepatitis B should be screened for this virus. You are considered at high risk for hepatitis B if:  You were born in a country where hepatitis B occurs often. Talk with your health care provider about which countries are considered high risk.  Your parents were born in a high-risk country and you have not received a shot to protect against hepatitis B (hepatitis B vaccine).  You have HIV or AIDS.  You use needles to inject street drugs.  You live with, or have sex with, someone who has hepatitis B.  You get hemodialysis treatment.  You take certain medicines for conditions like cancer, organ transplantation, and autoimmune conditions.  Hepatitis C blood testing is recommended for all people born from 1945 through 1965 and any individual with known risks for hepatitis C.  Practice safe sex. Use condoms and avoid high-risk sexual practices to reduce the spread of sexually transmitted infections (STIs). STIs include gonorrhea, chlamydia, syphilis, trichomonas, herpes, HPV, and human immunodeficiency virus (HIV). Herpes, HIV, and HPV are viral illnesses that have no cure. They can result in disability, cancer, and death.  You should be screened for sexually transmitted illnesses (STIs) including gonorrhea and chlamydia if:  You are sexually active and are younger than 24 years.  You are older than 24 years and your health care provider tells you that you are at risk for this type of infection.  Your sexual activity has changed since you were last screened and you are at an increased risk for chlamydia or gonorrhea. Ask your health care provider if you are at risk.  If you are at risk of being infected with HIV, it is recommended that you take a prescription medicine daily to prevent HIV infection. This is  called preexposure prophylaxis (PrEP). You are considered at risk if:  You are a heterosexual woman, are sexually active, and are at increased risk for HIV infection.  You take drugs by injection.  You are sexually active with a partner who has HIV.  Talk with your health care provider about whether you are at high risk of being infected with HIV. If you choose to begin PrEP, you should first be tested for HIV. You should then be tested every 3 months for as long as you are taking PrEP.  Osteoporosis is a disease in which the bones lose minerals and strength   with aging. This can result in serious bone fractures or breaks. The risk of osteoporosis can be identified using a bone density scan. Women ages 65 years and over and women at risk for fractures or osteoporosis should discuss screening with their health care providers. Ask your health care provider whether you should take a calcium supplement or vitamin D to reduce the rate of osteoporosis.  Menopause can be associated with physical symptoms and risks. Hormone replacement therapy is available to decrease symptoms and risks. You should talk to your health care provider about whether hormone replacement therapy is right for you.  Use sunscreen. Apply sunscreen liberally and repeatedly throughout the day. You should seek shade when your shadow is shorter than you. Protect yourself by wearing long sleeves, pants, a wide-brimmed hat, and sunglasses year round, whenever you are outdoors.  Once a month, do a whole body skin exam, using a mirror to look at the skin on your back. Tell your health care provider of new moles, moles that have irregular borders, moles that are larger than a pencil eraser, or moles that have changed in shape or color.  Stay current with required vaccines (immunizations).  Influenza vaccine. All adults should be immunized every year.  Tetanus, diphtheria, and acellular pertussis (Td, Tdap) vaccine. Pregnant women should  receive 1 dose of Tdap vaccine during each pregnancy. The dose should be obtained regardless of the length of time since the last dose. Immunization is preferred during the 27th-36th week of gestation. An adult who has not previously received Tdap or who does not know her vaccine status should receive 1 dose of Tdap. This initial dose should be followed by tetanus and diphtheria toxoids (Td) booster doses every 10 years. Adults with an unknown or incomplete history of completing a 3-dose immunization series with Td-containing vaccines should begin or complete a primary immunization series including a Tdap dose. Adults should receive a Td booster every 10 years.  Varicella vaccine. An adult without evidence of immunity to varicella should receive 2 doses or a second dose if she has previously received 1 dose. Pregnant females who do not have evidence of immunity should receive the first dose after pregnancy. This first dose should be obtained before leaving the health care facility. The second dose should be obtained 4-8 weeks after the first dose.  Human papillomavirus (HPV) vaccine. Females aged 13-26 years who have not received the vaccine previously should obtain the 3-dose series. The vaccine is not recommended for use in pregnant females. However, pregnancy testing is not needed before receiving a dose. If a female is found to be pregnant after receiving a dose, no treatment is needed. In that case, the remaining doses should be delayed until after the pregnancy. Immunization is recommended for any person with an immunocompromised condition through the age of 26 years if she did not get any or all doses earlier. During the 3-dose series, the second dose should be obtained 4-8 weeks after the first dose. The third dose should be obtained 24 weeks after the first dose and 16 weeks after the second dose.  Zoster vaccine. One dose is recommended for adults aged 60 years or older unless certain conditions are  present.  Measles, mumps, and rubella (MMR) vaccine. Adults born before 1957 generally are considered immune to measles and mumps. Adults born in 1957 or later should have 1 or more doses of MMR vaccine unless there is a contraindication to the vaccine or there is laboratory evidence of immunity to   each of the three diseases. A routine second dose of MMR vaccine should be obtained at least 28 days after the first dose for students attending postsecondary schools, health care workers, or international travelers. People who received inactivated measles vaccine or an unknown type of measles vaccine during 1963-1967 should receive 2 doses of MMR vaccine. People who received inactivated mumps vaccine or an unknown type of mumps vaccine before 1979 and are at high risk for mumps infection should consider immunization with 2 doses of MMR vaccine. For females of childbearing age, rubella immunity should be determined. If there is no evidence of immunity, females who are not pregnant should be vaccinated. If there is no evidence of immunity, females who are pregnant should delay immunization until after pregnancy. Unvaccinated health care workers born before 1957 who lack laboratory evidence of measles, mumps, or rubella immunity or laboratory confirmation of disease should consider measles and mumps immunization with 2 doses of MMR vaccine or rubella immunization with 1 dose of MMR vaccine.  Pneumococcal 13-valent conjugate (PCV13) vaccine. When indicated, a person who is uncertain of her immunization history and has no record of immunization should receive the PCV13 vaccine. An adult aged 19 years or older who has certain medical conditions and has not been previously immunized should receive 1 dose of PCV13 vaccine. This PCV13 should be followed with a dose of pneumococcal polysaccharide (PPSV23) vaccine. The PPSV23 vaccine dose should be obtained at least 8 weeks after the dose of PCV13 vaccine. An adult aged 19  years or older who has certain medical conditions and previously received 1 or more doses of PPSV23 vaccine should receive 1 dose of PCV13. The PCV13 vaccine dose should be obtained 1 or more years after the last PPSV23 vaccine dose.  Pneumococcal polysaccharide (PPSV23) vaccine. When PCV13 is also indicated, PCV13 should be obtained first. All adults aged 65 years and older should be immunized. An adult younger than age 65 years who has certain medical conditions should be immunized. Any person who resides in a nursing home or long-term care facility should be immunized. An adult smoker should be immunized. People with an immunocompromised condition and certain other conditions should receive both PCV13 and PPSV23 vaccines. People with human immunodeficiency virus (HIV) infection should be immunized as soon as possible after diagnosis. Immunization during chemotherapy or radiation therapy should be avoided. Routine use of PPSV23 vaccine is not recommended for American Indians, [place] Natives, or people younger than 65 years unless there are medical conditions that require PPSV23 vaccine. When indicated, people who have unknown immunization and have no record of immunization should receive PPSV23 vaccine. One-time revaccination 5 years after the first dose of PPSV23 is recommended for people aged 19-64 years who have chronic kidney failure, nephrotic syndrome, asplenia, or immunocompromised conditions. People who received 1-2 doses of PPSV23 before age 65 years should receive another dose of PPSV23 vaccine at age 65 years or later if at least 5 years have passed since the previous dose. Doses of PPSV23 are not needed for people immunized with PPSV23 at or after age 65 years.  Meningococcal vaccine. Adults with asplenia or persistent complement component deficiencies should receive 2 doses of quadrivalent meningococcal conjugate (MenACWY-D) vaccine. The doses should be obtained at least 2 months apart.  Microbiologists working with certain meningococcal bacteria, military recruits, people at risk during an outbreak, and people who travel to or live in countries with a high rate of meningitis should be immunized. A first-year college student up through age   21 years who is living in a residence hall should receive a dose if she did not receive a dose on or after her 16th birthday. Adults who have certain high-risk conditions should receive one or more doses of vaccine.  Hepatitis A vaccine. Adults who wish to be protected from this disease, have certain high-risk conditions, work with hepatitis A-infected animals, work in hepatitis A research labs, or travel to or work in countries with a high rate of hepatitis A should be immunized. Adults who were previously unvaccinated and who anticipate close contact with an international adoptee during the first 60 days after arrival in the [place] from a country with a high rate of hepatitis A should be immunized.  Hepatitis B vaccine. Adults who wish to be protected from this disease, have certain high-risk conditions, may be exposed to blood or other infectious body fluids, are household contacts or sex partners of hepatitis B positive people, are clients or workers in certain care facilities, or travel to or work in countries with a high rate of hepatitis B should be immunized.  Haemophilus influenzae type b (Hib) vaccine. A previously unvaccinated person with asplenia or sickle cell disease or having a scheduled splenectomy should receive 1 dose of Hib vaccine. Regardless of previous immunization, a recipient of a hematopoietic stem cell transplant should receive a 3-dose series 6-12 months after her successful transplant. Hib vaccine is not recommended for adults with HIV infection. Preventive Services / Frequency Ages 64 to 68 years  Blood pressure check.** / Every 1 to 2 years.  Lipid and cholesterol check.** / Every 5 years beginning at age  22.  Clinical breast exam.** / Every 3 years for women in their 88s and 53s.  BRCA-related cancer risk assessment.** / For women who have family members with a BRCA-related cancer (breast, ovarian, tubal, or peritoneal cancers).  Pap test.** / Every 2 years from ages 90 through 51. Every 3 years starting at age 21 through age 56 or 3 with a history of 3 consecutive normal Pap tests.  HPV screening.** / Every 3 years from ages 24 through ages 1 to 46 with a history of 3 consecutive normal Pap tests.  Hepatitis C blood test.** / For any individual with known risks for hepatitis C.  Skin self-exam. / Monthly.  Influenza vaccine. / Every year.  Tetanus, diphtheria, and acellular pertussis (Tdap, Td) vaccine.** / Consult your health care provider. Pregnant women should receive 1 dose of Tdap vaccine during each pregnancy. 1 dose of Td every 10 years.  Varicella vaccine.** / Consult your health care provider. Pregnant females who do not have evidence of immunity should receive the first dose after pregnancy.  HPV vaccine. / 3 doses over 6 months, if 72 and younger. The vaccine is not recommended for use in pregnant females. However, pregnancy testing is not needed before receiving a dose.  Measles, mumps, rubella (MMR) vaccine.** / You need at least 1 dose of MMR if you were born in 1957 or later. You may also need a 2nd dose. For females of childbearing age, rubella immunity should be determined. If there is no evidence of immunity, females who are not pregnant should be vaccinated. If there is no evidence of immunity, females who are pregnant should delay immunization until after pregnancy.  Pneumococcal 13-valent conjugate (PCV13) vaccine.** / Consult your health care provider.  Pneumococcal polysaccharide (PPSV23) vaccine.** / 1 to 2 doses if you smoke cigarettes or if you have certain conditions.  Meningococcal vaccine.** /  1 dose if you are age 19 to 21 years and a first-year college  student living in a residence hall, or have one of several medical conditions, you need to get vaccinated against meningococcal disease. You may also need additional booster doses.  Hepatitis A vaccine.** / Consult your health care provider.  Hepatitis B vaccine.** / Consult your health care provider.  Haemophilus influenzae type b (Hib) vaccine.** / Consult your health care provider. Ages 40 to 64 years  Blood pressure check.** / Every 1 to 2 years.  Lipid and cholesterol check.** / Every 5 years beginning at age 20 years.  Lung cancer screening. / Every year if you are aged 55-80 years and have a 30-pack-year history of smoking and currently smoke or have quit within the past 15 years. Yearly screening is stopped once you have quit smoking for at least 15 years or develop a health problem that would prevent you from having lung cancer treatment.  Clinical breast exam.** / Every year after age 40 years.  BRCA-related cancer risk assessment.** / For women who have family members with a BRCA-related cancer (breast, ovarian, tubal, or peritoneal cancers).  Mammogram.** / Every year beginning at age 40 years and continuing for as long as you are in good health. Consult with your health care provider.  Pap test.** / Every 3 years starting at age 30 years through age 65 or 70 years with a history of 3 consecutive normal Pap tests.  HPV screening.** / Every 3 years from ages 30 years through ages 65 to 70 years with a history of 3 consecutive normal Pap tests.  Fecal occult blood test (FOBT) of stool. / Every year beginning at age 50 years and continuing until age 75 years. You may not need to do this test if you get a colonoscopy every 10 years.  Flexible sigmoidoscopy or colonoscopy.** / Every 5 years for a flexible sigmoidoscopy or every 10 years for a colonoscopy beginning at age 50 years and continuing until age 75 years.  Hepatitis C blood test.** / For all people born from 1945 through  1965 and any individual with known risks for hepatitis C.  Skin self-exam. / Monthly.  Influenza vaccine. / Every year.  Tetanus, diphtheria, and acellular pertussis (Tdap/Td) vaccine.** / Consult your health care provider. Pregnant women should receive 1 dose of Tdap vaccine during each pregnancy. 1 dose of Td every 10 years.  Varicella vaccine.** / Consult your health care provider. Pregnant females who do not have evidence of immunity should receive the first dose after pregnancy.  Zoster vaccine.** / 1 dose for adults aged 60 years or older.  Measles, mumps, rubella (MMR) vaccine.** / You need at least 1 dose of MMR if you were born in 1957 or later. You may also need a 2nd dose. For females of childbearing age, rubella immunity should be determined. If there is no evidence of immunity, females who are not pregnant should be vaccinated. If there is no evidence of immunity, females who are pregnant should delay immunization until after pregnancy.  Pneumococcal 13-valent conjugate (PCV13) vaccine.** / Consult your health care provider.  Pneumococcal polysaccharide (PPSV23) vaccine.** / 1 to 2 doses if you smoke cigarettes or if you have certain conditions.  Meningococcal vaccine.** / Consult your health care provider.  Hepatitis A vaccine.** / Consult your health care provider.  Hepatitis B vaccine.** / Consult your health care provider.  Haemophilus influenzae type b (Hib) vaccine.** / Consult your health care provider. Ages 65   years and over  Blood pressure check.** / Every 1 to 2 years.  Lipid and cholesterol check.** / Every 5 years beginning at age 22 years.  Lung cancer screening. / Every year if you are aged 73-80 years and have a 30-pack-year history of smoking and currently smoke or have quit within the past 15 years. Yearly screening is stopped once you have quit smoking for at least 15 years or develop a health problem that would prevent you from having lung cancer  treatment.  Clinical breast exam.** / Every year after age 4 years.  BRCA-related cancer risk assessment.** / For women who have family members with a BRCA-related cancer (breast, ovarian, tubal, or peritoneal cancers).  Mammogram.** / Every year beginning at age 40 years and continuing for as long as you are in good health. Consult with your health care provider.  Pap test.** / Every 3 years starting at age 9 years through age 34 or 91 years with 3 consecutive normal Pap tests. Testing can be stopped between 65 and 70 years with 3 consecutive normal Pap tests and no abnormal Pap or HPV tests in the past 10 years.  HPV screening.** / Every 3 years from ages 57 years through ages 64 or 45 years with a history of 3 consecutive normal Pap tests. Testing can be stopped between 65 and 70 years with 3 consecutive normal Pap tests and no abnormal Pap or HPV tests in the past 10 years.  Fecal occult blood test (FOBT) of stool. / Every year beginning at age 15 years and continuing until age 17 years. You may not need to do this test if you get a colonoscopy every 10 years.  Flexible sigmoidoscopy or colonoscopy.** / Every 5 years for a flexible sigmoidoscopy or every 10 years for a colonoscopy beginning at age 86 years and continuing until age 71 years.  Hepatitis C blood test.** / For all people born from [birth year] through 1966 and any individual with known risks for hepatitis C.  Osteoporosis screening.** / A one-time screening for women ages 83 years and over and women at risk for fractures or osteoporosis.  Skin self-exam. / Monthly.  Influenza vaccine. / Every year.  Tetanus, diphtheria, and acellular pertussis (Tdap/Td) vaccine.** / 1 dose of Td every 10 years.  Varicella vaccine.** / Consult your health care provider.  Zoster vaccine.** / 1 dose for adults aged 61 years or older.  Pneumococcal 13-valent conjugate (PCV13) vaccine.** / Consult your health care provider.  Pneumococcal  polysaccharide (PPSV23) vaccine.** / 1 dose for all adults aged 28 years and older.  Meningococcal vaccine.** / Consult your health care provider.  Hepatitis A vaccine.** / Consult your health care provider.  Hepatitis B vaccine.** / Consult your health care provider.  Haemophilus influenzae type b (Hib) vaccine.** / Consult your health care provider. ** Family history and personal history of risk and conditions may change your health care provider's recommendations. Document Released: 10/17/2001 Document Revised: 01/05/2014 Document Reviewed: 01/16/2011 [facility] Patient Information 2015 [place], [place]. This information is not intended to replace advice given to you by your health care provider. Make sure you discuss any questions you have with your health care provider.

## 2014-10-01 NOTE — Progress Notes (Signed)
Pre visit review using our clinic review tool, if applicable. No additional management support is needed unless otherwise documented below in the visit note. 

## 2014-10-01 NOTE — Progress Notes (Signed)
Subjective:     [name] is a 54 y.o. female and is here for a comprehensive physical exam. The patient reports no problems.  History   Social History  . Marital Status: Divorced    Spouse Name: N/A    Number of Children: 1  . Years of Education: N/A   Occupational History  . [employer]   Social History Main Topics  . Smoking status: Never Smoker   . Smokeless tobacco: Never Used  . Alcohol Use: No  . Drug Use: No  . Sexual Activity:    Partners: Male    Birth Control/ Protection: Surgical   Other Topics Concern  . Not on file   Social History Narrative   Health Maintenance  Topic Date Due  . MAMMOGRAM  10/23/2014  . INFLUENZA VACCINE  04/05/2015  . PAP SMEAR  07/15/2017  . TETANUS/TDAP  12/17/2018  . COLONOSCOPY  04/26/2021    The following portions of the patient's history were reviewed and updated as appropriate:  She  has a past medical history of Arthritis. She  does not have any pertinent problems on file. She  has past surgical history that includes obstructed bile duct (2004); Abdominal hysterectomy (2001); Rotator cuff repair (12/2010); and Oophorectomy. Her family history includes Arthritis in her mother and sister; Heart disease (age of onset: 58) in her mother; Hypertension in her father and mother; Stroke in her mother. There is no history of Colon cancer or Colon polyps. She  reports that she has never smoked. She has never used smokeless tobacco. She reports that she does not drink alcohol or use illicit drugs. She has a current medication list which includes the following prescription(s): vitamin d (ergocalciferol). Current Outpatient Prescriptions on File Prior to Visit  Medication Sig Dispense Refill  . Vitamin D, Ergocalciferol, (DRISDOL) 50000 UNITS CAPS capsule Take 1 capsule (50,000 Units total) by mouth every 7 (seven) days. 12 capsule 0   No current facility-administered medications on file prior to visit.   She has No  Known Allergies..  Review of Systems Review of Systems  Constitutional: Negative for activity change, appetite change and fatigue.  HENT: Negative for hearing loss, congestion, tinnitus and ear discharge.  dentist q65m Eyes: Negative for visual disturbance (see optho q1y -- vision corrected to 20/20 with glasses).  Respiratory: Negative for cough, chest tightness and shortness of breath.   Cardiovascular: Negative for chest pain, palpitations and leg swelling.  Gastrointestinal: Negative for abdominal pain, diarrhea, constipation and abdominal distention.  Genitourinary: Negative for urgency, frequency, decreased urine volume and difficulty urinating.  Musculoskeletal: Negative for back pain, arthralgias and gait problem.  Skin: Negative for color change, pallor and rash.  Neurological: Negative for dizziness, light-headedness, numbness and headaches.  Hematological: Negative for adenopathy. Does not bruise/bleed easily.  Psychiatric/Behavioral: Negative for suicidal ideas, confusion, sleep disturbance, self-injury, dysphoric mood, decreased concentration and agitation.      Objective:    BP 130/76 mmHg  Pulse 73  Temp(Src) 98.2 F (36.8 C) (Oral)  Ht 5\' 5"  (1.651 m)  Wt 185 lb 3.2 oz (84.006 kg)  BMI 30.82 kg/m2  SpO2 100% General appearance: alert, cooperative, appears stated age and no distress Head: Normocephalic, without obvious abnormality, atraumatic Eyes: conjunctivae/corneas clear. PERRL, EOM's intact. Fundi benign. Ears: normal TM's and external ear canals both ears Nose: Nares normal. Septum midline. Mucosa normal. No drainage or sinus tenderness. Throat: lips, mucosa, and tongue normal; teeth and gums normal Neck: no adenopathy,  no carotid bruit, no JVD, supple, symmetrical, trachea midline and thyroid not enlarged, symmetric, no tenderness/mass/nodules Back: symmetric, no curvature. ROM normal. No CVA tenderness. Lungs: clear to auscultation bilaterally Breasts:  normal appearance, no masses or tenderness Heart: S1, S2 normal Abdomen: soft, non-tender; bowel sounds normal; no masses,  no organomegaly Pelvic: not indicated; status post hysterectomy, negative ROS Extremities: extremities normal, atraumatic, no cyanosis or edema Pulses: 2+ and symmetric Skin: Skin color, texture, turgor normal. No rashes or lesions Lymph nodes: Cervical, supraclavicular, and axillary nodes normal. Neurologic: Alert and oriented X 3, normal strength and tone. Normal symmetric reflexes. Normal coordination and gait Psych- no depression, no anxiety      Assessment:    Healthy female exam.       Plan:    ghm utd Check labs See After Visit Summary for Counseling Recommendations    1. Need for prophylactic vaccination and inoculation against influenza  - Flu Vaccine QUAD 36+ mos PF IM (Fluarix Quad PF) - Vitamin D 1,25 dihydroxy  2. Preventative health care  - Basic metabolic panel - CBC with Differential/Platelet - Hepatic function panel - Lipid panel - POCT urinalysis dipstick - TSH - Vitamin D 1,25 dihydroxy - HIV antibody - Urine cytology ancillary only - HSV 2 antibody, IgG - RPR  3. Vitamin D deficiency  - Vitamin D 1,25 dihydroxy  4. Generalized OA  - celecoxib (CELEBREX) 200 MG capsule; Take 1 capsule (200 mg total) by mouth daily.  Dispense: 30 capsule; Refill: 5

## 2014-10-02 LAB — HEPATIC FUNCTION PANEL
ALBUMIN: 4.2 g/dL (ref 3.5–5.2)
ALK PHOS: 82 U/L (ref 39–117)
ALT: 11 U/L (ref 0–35)
AST: 18 U/L (ref 0–37)
BILIRUBIN DIRECT: 0.1 mg/dL (ref 0.0–0.3)
BILIRUBIN TOTAL: 0.5 mg/dL (ref 0.2–1.2)
Total Protein: 7.4 g/dL (ref 6.0–8.3)

## 2014-10-02 LAB — LIPID PANEL
CHOLESTEROL: 242 mg/dL — AB (ref 0–200)
HDL: 69.6 mg/dL (ref >39.00)
LDL Cholesterol: 146 mg/dL — ABNORMAL HIGH (ref 0–99)
NONHDL: 172.4
Total CHOL/HDL Ratio: 3
Triglycerides: 131 mg/dL (ref 0.0–149.0)
VLDL: 26.2 mg/dL (ref 0.0–40.0)

## 2014-10-02 LAB — CBC WITH DIFFERENTIAL/PLATELET
BASOS PCT: 0.5 % (ref 0.0–3.0)
Basophils Absolute: 0 10*3/uL (ref 0.0–0.1)
EOS ABS: 0 10*3/uL (ref 0.0–0.7)
EOS PCT: 0.7 % (ref 0.0–5.0)
HCT: 37.6 % (ref 36.0–46.0)
Hemoglobin: 12.5 g/dL (ref 12.0–15.0)
LYMPHS PCT: 54.3 % — AB (ref 12.0–46.0)
Lymphs Abs: 2.7 10*3/uL (ref 0.7–4.0)
MCHC: 33.3 g/dL (ref 30.0–36.0)
MCV: 88.8 fl (ref 78.0–100.0)
MONO ABS: 0.3 10*3/uL (ref 0.1–1.0)
Monocytes Relative: 5.1 % (ref 3.0–12.0)
Neutro Abs: 1.9 10*3/uL (ref 1.4–7.7)
Neutrophils Relative %: 39.4 % — ABNORMAL LOW (ref 43.0–77.0)
Platelets: 210 10*3/uL (ref 150.0–400.0)
RBC: 4.24 Mil/uL (ref 3.87–5.11)
RDW: 13.7 % (ref 11.5–15.5)
WBC: 4.9 10*3/uL (ref 4.0–10.5)

## 2014-10-02 LAB — TSH: TSH: 0.66 u[IU]/mL (ref 0.35–4.50)

## 2014-10-02 LAB — BASIC METABOLIC PANEL
BUN: 11 mg/dL (ref 6–23)
CO2: 27 meq/L (ref 19–32)
Calcium: 9.4 mg/dL (ref 8.4–10.5)
Chloride: 104 mEq/L (ref 96–112)
Creatinine, Ser: 0.81 mg/dL (ref 0.40–1.20)
GFR: 94.53 mL/min (ref >60.00)
Glucose, Bld: 81 mg/dL (ref 70–99)
POTASSIUM: 3.7 meq/L (ref 3.5–5.1)
SODIUM: 139 meq/L (ref 135–145)

## 2014-10-02 LAB — RPR

## 2014-10-02 LAB — HIV ANTIBODY (ROUTINE TESTING W REFLEX): HIV 1&2 Ab, 4th Generation: NONREACTIVE

## 2014-10-02 LAB — HSV 2 ANTIBODY, IGG: HSV 2 GLYCOPROTEIN G AB, IGG: 0.64 IV

## 2014-10-05 LAB — URINE CYTOLOGY ANCILLARY ONLY
BACTERIAL VAGINITIS: NEGATIVE
Candida vaginitis: NEGATIVE
Chlamydia: NEGATIVE
Neisseria Gonorrhea: NEGATIVE
Trichomonas: NEGATIVE

## 2014-10-05 LAB — VITAMIN D 1,25 DIHYDROXY
VITAMIN D2 1, 25 (OH): 41 pg/mL
Vitamin D 1, 25 (OH)2 Total: 50 pg/mL (ref 18–72)
Vitamin D3 1, 25 (OH)2: 9 pg/mL

## 2014-10-25 ENCOUNTER — Other Ambulatory Visit: Payer: Self-pay | Admitting: Gynecology

## 2015-01-25 ENCOUNTER — Telehealth: Payer: Self-pay | Admitting: Family Medicine

## 2015-01-25 NOTE — Telephone Encounter (Signed)
Needs to come in for office visit or Dr. Etter Sjogren will need to address as OV was in march.

## 2015-01-25 NOTE — Telephone Encounter (Signed)
Celebrex prescribed on 10/01/14 1 po qd #30 for generalized OA, please advise on alternative or if this should wait to Dr.Lowne returns.       KP

## 2015-01-25 NOTE — Telephone Encounter (Signed)
Patient is going to need an OV per [facility].      KP

## 2015-01-25 NOTE — Telephone Encounter (Signed)
Caller name: Kahleah Crass Relationship to patient: self Can be reached: [phone] [facility] [address]  Reason for call: Pt states that she doesn't feel the celebrex is working for her. She is still having pain. Actually feels like it hurts worse. She is asking if a different medication can be sent in for her.

## 2015-01-25 NOTE — Telephone Encounter (Signed)
appt scheduled for 5/31 with Dr. Etter Sjogren

## 2015-02-02 ENCOUNTER — Ambulatory Visit: Payer: BLUE CROSS/BLUE SHIELD | Admitting: Family Medicine

## 2015-02-16 ENCOUNTER — Ambulatory Visit (INDEPENDENT_AMBULATORY_CARE_PROVIDER_SITE_OTHER): Payer: BLUE CROSS/BLUE SHIELD | Admitting: Family Medicine

## 2015-02-16 ENCOUNTER — Encounter: Payer: Self-pay | Admitting: Family Medicine

## 2015-02-16 ENCOUNTER — Ambulatory Visit (HOSPITAL_BASED_OUTPATIENT_CLINIC_OR_DEPARTMENT_OTHER)
Admission: RE | Admit: 2015-02-16 | Discharge: 2015-02-16 | Disposition: A | Discharge status: Home / Self Care | Payer: BLUE CROSS/BLUE SHIELD | Source: Ambulatory Visit | Attending: Family Medicine | Admitting: Family Medicine

## 2015-02-16 ENCOUNTER — Other Ambulatory Visit: Payer: Self-pay | Admitting: Family Medicine

## 2015-02-16 VITALS — BP 132/92 | HR 71 | Temp 98.1°F | Resp 16 | Wt 184.6 lb

## 2015-02-16 DIAGNOSIS — M25552 Pain in left hip: Secondary | ICD-10-CM

## 2015-02-16 DIAGNOSIS — M5136 Other intervertebral disc degeneration, lumbar region: Secondary | ICD-10-CM | POA: Insufficient documentation

## 2015-02-16 DIAGNOSIS — M5442 Lumbago with sciatica, left side: Secondary | ICD-10-CM

## 2015-02-16 DIAGNOSIS — M549 Dorsalgia, unspecified: Secondary | ICD-10-CM | POA: Diagnosis present

## 2015-02-16 MED ORDER — TRAMADOL HCL 50 MG PO TABS: 50.0000 mg | ORAL_TABLET | Freq: Three times a day (TID) | ORAL | Status: DC | PRN

## 2015-02-16 NOTE — Assessment & Plan Note (Signed)
Check xrays and ultram for pain Consider PT vs MRI if pain does not subside

## 2015-02-16 NOTE — Progress Notes (Signed)
+Patient ID: [name], female    DOB: [birth year]  Age: 55 y.o. MRN: [id number]    Subjective:  Subjective HPI ZORAH BACKES presents with c/o L hip and r knee pain.  No known injury.  Pt states L hip pain radiates down L leg to foot.   R knee hurts --no injury.  Hip really hurts worse.  X several months Review of Systems  Constitutional: Negative for activity change, appetite change, fatigue and unexpected weight change.  Respiratory: Negative for cough and shortness of breath.   Cardiovascular: Negative for chest pain and palpitations.  Musculoskeletal: Positive for arthralgias.       Hip and knee pain  Psychiatric/Behavioral: Negative for behavioral problems and dysphoric mood. The patient is not nervous/anxious.     History Past Medical History  Diagnosis Date  . Arthritis     rheumatoid per pt    She has past surgical history that includes obstructed bile duct (2004); Abdominal hysterectomy (2001); Rotator cuff repair (12/2010); and Oophorectomy.   Her family history includes Arthritis in her mother and sister; Heart disease (age of onset: 34) in her mother; Hypertension in her father and mother; Stroke in her mother. There is no history of Colon cancer or Colon polyps.She reports that she has never smoked. She has never used smokeless tobacco. She reports that she does not drink alcohol or use illicit drugs.  No current outpatient prescriptions on file prior to visit.   No current facility-administered medications on file prior to visit.     Objective:  Objective Physical Exam  Constitutional: She is oriented to person, place, and time. She appears well-developed and well-nourished.  HENT:  Head: Normocephalic and atraumatic.  Eyes: Conjunctivae and EOM are normal.  Neck: Normal range of motion. Neck supple. No JVD present. Carotid bruit is not present. No thyromegaly present.  Cardiovascular: Normal rate, regular rhythm and normal heart sounds.   No murmur  heard. Pulmonary/Chest: Effort normal and breath sounds normal. No respiratory distress. She has no wheezes. She has no rales. She exhibits no tenderness.  Musculoskeletal: She exhibits no edema.  Neurological: She is alert and oriented to person, place, and time. She has normal strength and normal reflexes. She displays normal reflexes. She exhibits normal muscle tone. Coordination normal.  Crepitus R knee No decrease rom or strength in L leg  Psychiatric: She has a normal mood and affect.   BP 132/92 mmHg  Pulse 71  Temp(Src) 98.1 F (36.7 C) (Oral)  Resp 16  Wt 184 lb 9.6 oz (83.734 kg) Wt Readings from Last 3 Encounters:  02/16/15 184 lb 9.6 oz (83.734 kg)  10/01/14 185 lb 3.2 oz (84.006 kg)  07/15/14 186 lb (84.369 kg)     Lab Results  Component Value Date   WBC 4.9 10/01/2014   HGB 12.5 10/01/2014   HCT 37.6 10/01/2014   PLT 210.0 10/01/2014   GLUCOSE 81 10/01/2014   CHOL 242* 10/01/2014   TRIG 131.0 10/01/2014   HDL 69.60 10/01/2014   LDLDIRECT 136.6 09/19/2012   LDLCALC 146* 10/01/2014   ALT 11 10/01/2014   AST 18 10/01/2014   NA 139 10/01/2014   K 3.7 10/01/2014   CL 104 10/01/2014   CREATININE 0.81 10/01/2014   BUN 11 10/01/2014   CO2 27 10/01/2014   TSH 0.66 10/01/2014    No results found.   Assessment & Plan:  Plan I have discontinued Ms. Hurtado's Vitamin D (Ergocalciferol) and celecoxib. I am also having her  start on traMADol.  Meds ordered this encounter  Medications  . traMADol (ULTRAM) 50 MG tablet    Sig: Take 1 tablet (50 mg total) by mouth every 8 (eight) hours as needed.    Dispense:  30 tablet    Refill:  0    Problem List Items Addressed This Visit    Back pain    Check xrays and ultram for pain Consider PT vs MRI if pain does not subside      Relevant Medications   traMADol (ULTRAM) 50 MG tablet   Other Relevant Orders   DG Lumbar Spine Complete    Other Visit Diagnoses    Hip pain, left    -  Primary    Relevant Orders     DG HIP UNILAT WITH PELVIS 1V LEFT       Follow-up: Return if symptoms worsen or fail to improve.  Garnet Koyanagi, DO

## 2015-02-16 NOTE — Patient Instructions (Signed)
Back Pain, Adult Low back pain is very common. About 1 in 5 people have back pain.The cause of low back pain is rarely dangerous. The pain often gets better over time.About half of people with a sudden onset of back pain feel better in just 2 weeks. About 8 in 10 people feel better by 6 weeks.  CAUSES Some common causes of back pain include:  Strain of the muscles or ligaments supporting the spine.  Wear and tear (degeneration) of the spinal discs.  Arthritis.  Direct injury to the back. DIAGNOSIS Most of the time, the direct cause of low back pain is not known.However, back pain can be treated effectively even when the exact cause of the pain is unknown.Answering your caregiver's questions about your overall health and symptoms is one of the most accurate ways to make sure the cause of your pain is not dangerous. If your caregiver needs more information, he or she may order lab work or imaging tests (X-rays or MRIs).However, even if imaging tests show changes in your back, this usually does not require surgery. HOME CARE INSTRUCTIONS For many people, back pain returns.Since low back pain is rarely dangerous, it is often a condition that people can learn to manageon their own.   Remain active. It is stressful on the back to sit or stand in one place. Do not sit, drive, or stand in one place for more than 30 minutes at a time. Take short walks on level surfaces as soon as pain allows.Try to increase the length of time you walk each day.  Do not stay in bed.Resting more than 1 or 2 days can delay your recovery.  Do not avoid exercise or work.Your body is made to move.It is not dangerous to be active, even though your back may hurt.Your back will likely heal faster if you return to being active before your pain is gone.  Pay attention to your body when you bend and lift. Many people have less discomfortwhen lifting if they bend their knees, keep the load close to their bodies,and  avoid twisting. Often, the most comfortable positions are those that put less stress on your recovering back.  Find a comfortable position to sleep. Use a firm mattress and lie on your side with your knees slightly bent. If you lie on your back, put a pillow under your knees.  Only take over-the-counter or prescription medicines as directed by your caregiver. Over-the-counter medicines to reduce pain and inflammation are often the most helpful.Your caregiver may prescribe muscle relaxant drugs.These medicines help dull your pain so you can more quickly return to your normal activities and healthy exercise.  Put ice on the injured area.  Put ice in a plastic bag.  Place a towel between your skin and the bag.  Leave the ice on for 15-20 minutes, 03-04 times a day for the first 2 to 3 days. After that, ice and heat may be alternated to reduce pain and spasms.  Ask your caregiver about trying back exercises and gentle massage. This may be of some benefit.  Avoid feeling anxious or stressed.Stress increases muscle tension and can worsen back pain.It is important to recognize when you are anxious or stressed and learn ways to manage it.Exercise is a great option. SEEK MEDICAL CARE IF:  You have pain that is not relieved with rest or medicine.  You have pain that does not improve in 1 week.  You have new symptoms.  You are generally not feeling well. SEEK   IMMEDIATE MEDICAL CARE IF:   You have pain that radiates from your back into your legs.  You develop new bowel or bladder control problems.  You have unusual weakness or numbness in your arms or legs.  You develop nausea or vomiting.  You develop abdominal pain.  You feel faint. Document Released: 08/21/2005 Document Revised: 02/20/2012 Document Reviewed: 12/23/2013 [employer] Patient Information 2015 [employer]. This information is not intended to replace advice given to you by your health care provider. Make sure you  discuss any questions you have with your health care provider.  

## 2015-02-16 NOTE — Progress Notes (Signed)
Pre visit review using our clinic review tool, if applicable. No additional management support is needed unless otherwise documented below in the visit note. 

## 2015-02-17 ENCOUNTER — Telehealth: Payer: Self-pay | Admitting: Family Medicine

## 2015-02-17 DIAGNOSIS — M5442 Lumbago with sciatica, left side: Secondary | ICD-10-CM

## 2015-02-17 NOTE — Telephone Encounter (Signed)
Caller name: Ravin Relation to pt: self Call back number: [phone] [facility] [address]  Reason for call:   Patient states that tramadol is not working and would like something else to be prescribed.

## 2015-02-17 NOTE — Telephone Encounter (Signed)
vicodin 5/325  #30 1 po q6h prn pain

## 2015-02-18 MED ORDER — HYDROCODONE-ACETAMINOPHEN 5-325 MG PO TABS: 1.0000 | ORAL_TABLET | Freq: Four times a day (QID) | ORAL | Status: DC | PRN

## 2015-02-18 NOTE — Telephone Encounter (Signed)
Ortho referral placed. Notified pt.

## 2015-02-18 NOTE — Telephone Encounter (Signed)
Notified pt that she will need to pick up Rx. At time of pick up she will need to sign CSC and provide UDS. Rx and contract placed at front desk for pick up. Pt voices understanding and wants to know what specialist we could refer her to for further evaluation. States pain is "bad and kept her awake most of the night and has been going on too long".  Reminded pt of PT option but pt wishes to hold off at this time.  Please advise re: referral.

## 2015-02-18 NOTE — Telephone Encounter (Signed)
Ortho for back pain

## 2015-03-15 ENCOUNTER — Telehealth: Payer: Self-pay | Admitting: Family Medicine

## 2015-03-15 DIAGNOSIS — M069 Rheumatoid arthritis, unspecified: Secondary | ICD-10-CM

## 2015-03-15 DIAGNOSIS — M25559 Pain in unspecified hip: Secondary | ICD-10-CM

## 2015-03-15 DIAGNOSIS — M545 Low back pain: Secondary | ICD-10-CM

## 2015-03-15 NOTE — Telephone Encounter (Signed)
Ref placed.      KP 

## 2015-03-15 NOTE — Telephone Encounter (Signed)
Relation to pt: self Call back number: [phone]    Reason for call:  Pt requesting a referral to [facility] Dr. Hardin Negus on [address] due to back and hip pain. Medication prescribed is not working for her rheumatoid arthritis. Please advise.

## 2015-03-30 ENCOUNTER — Encounter: Payer: Self-pay | Admitting: Internal Medicine

## 2015-04-06 ENCOUNTER — Encounter: Payer: Self-pay | Admitting: Family Medicine

## 2015-04-06 ENCOUNTER — Telehealth: Payer: Self-pay | Admitting: Family Medicine

## 2015-04-06 ENCOUNTER — Ambulatory Visit (INDEPENDENT_AMBULATORY_CARE_PROVIDER_SITE_OTHER): Payer: BLUE CROSS/BLUE SHIELD | Admitting: Family Medicine

## 2015-04-06 VITALS — BP 128/96 | HR 72 | Temp 98.0°F | Wt 186.2 lb

## 2015-04-06 DIAGNOSIS — M791 Myalgia: Secondary | ICD-10-CM | POA: Diagnosis not present

## 2015-04-06 DIAGNOSIS — Z1231 Encounter for screening mammogram for malignant neoplasm of breast: Secondary | ICD-10-CM

## 2015-04-06 DIAGNOSIS — M609 Myositis, unspecified: Secondary | ICD-10-CM

## 2015-04-06 DIAGNOSIS — IMO0001 Reserved for inherently not codable concepts without codable children: Secondary | ICD-10-CM

## 2015-04-06 MED ORDER — METHYLPREDNISOLONE ACETATE 40 MG/ML IJ SUSP
40.0000 mg | Freq: Once | INTRAMUSCULAR | Status: AC
  Administered 2015-04-06: 40 mg via INTRAMUSCULAR

## 2015-04-06 MED ORDER — PREDNISONE 10 MG PO TABS: ORAL_TABLET | ORAL | Status: DC

## 2015-04-06 NOTE — Progress Notes (Signed)
Pre visit review using our clinic review tool, if applicable. No additional management support is needed unless otherwise documented below in the visit note. 

## 2015-04-06 NOTE — Telephone Encounter (Signed)
Shiquita C Johnson at 04/06/2015 11:59 AM     Status: Signed       Expand All Collapse All   Caller name: Mareesa Gathright Relationship to patient: Self  Can be reached: [phone]  Pharmacy:  Reason for call: returning your call

## 2015-04-06 NOTE — Telephone Encounter (Signed)
Caller name: Tanny Harnack Relationship to patient: self Can be reached: [phone]  Reason for call: Pt is in a lot of pain. She states she has called [facility] several times. She left 4 msgs with no return calls. Then she contacted another time and man told her the doctor has to approve it and she can call back if she wants. She is not getting a good impression. She asked if a rheumatologist might help or what she can do in the meantime. She is hurting.

## 2015-04-06 NOTE — Telephone Encounter (Signed)
Patient with Hx of LBP, Hip pain and RA. Please advise    KP

## 2015-04-06 NOTE — Telephone Encounter (Signed)
It takes a while for all of the pain management-- she can be seen here in meantime

## 2015-04-06 NOTE — Telephone Encounter (Signed)
MSG left to call the office      KP 

## 2015-04-06 NOTE — Progress Notes (Signed)
Patient ID: [name], female    DOB: April 01, 1961  Age: 54 y.o. MRN: [id number]    Subjective:  Subjective HPI [name] presents c/o pain all over.  No improvement since last visit.  No recent fever. No upper resp symptoms.  No known insect bites.    Review of Systems  Constitutional: Negative for diaphoresis, appetite change, fatigue and unexpected weight change.  Eyes: Negative for pain, redness and visual disturbance.  Respiratory: Negative for cough, chest tightness, shortness of breath and wheezing.   Cardiovascular: Negative for chest pain, palpitations and leg swelling.  Endocrine: Negative for cold intolerance, heat intolerance, polydipsia, polyphagia and polyuria.  Genitourinary: Negative for dysuria, frequency and difficulty urinating.  Musculoskeletal: Positive for myalgias and arthralgias. Negative for joint swelling and gait problem.  Neurological: Negative for dizziness, light-headedness, numbness and headaches.    History Past Medical History  Diagnosis Date  . Arthritis     rheumatoid per pt    She has past surgical history that includes obstructed bile duct (2004); Abdominal hysterectomy (2001); Rotator cuff repair (12/2010); and Oophorectomy.   Her family history includes Arthritis in her mother and sister; Heart disease (age of onset: 51) in her mother; Hypertension in her father and mother; Stroke in her mother. There is no history of Colon cancer or Colon polyps.She reports that she has never smoked. She has never used smokeless tobacco. She reports that she does not drink alcohol or use illicit drugs.  Current Outpatient Prescriptions on File Prior to Visit  Medication Sig Dispense Refill  . HYDROcodone-acetaminophen (NORCO/VICODIN) 5-325 MG per tablet Take 1 tablet by mouth every 6 (six) hours as needed for severe pain. 30 tablet 0  . traMADol (ULTRAM) 50 MG tablet Take 1 tablet (50 mg total) by mouth every 8 (eight) hours as needed. 30 tablet 0   No  current facility-administered medications on file prior to visit.     Objective:  Objective Physical Exam  Constitutional: She is oriented to person, place, and time. She appears well-developed and well-nourished.  HENT:  Head: Normocephalic and atraumatic.  Eyes: Conjunctivae and EOM are normal.  Neck: Normal range of motion. Neck supple. No JVD present. Carotid bruit is not present. No thyromegaly present.  Cardiovascular: Normal rate, regular rhythm and normal heart sounds.   No murmur heard. Pulmonary/Chest: Effort normal and breath sounds normal. No respiratory distress. She has no wheezes. She has no rales. She exhibits no tenderness.  Musculoskeletal: She exhibits no edema.  Neurological: She is alert and oriented to person, place, and time.  Psychiatric: She has a normal mood and affect. Her behavior is normal.   BP 128/96 mmHg  Pulse 72  Temp(Src) 98 F (36.7 C) (Oral)  Wt 186 lb 3.2 oz (84.46 kg)  SpO2 94% Wt Readings from Last 3 Encounters:  04/06/15 186 lb 3.2 oz (84.46 kg)  02/16/15 184 lb 9.6 oz (83.734 kg)  10/01/14 185 lb 3.2 oz (84.006 kg)     Lab Results  Component Value Date   WBC 4.9 10/01/2014   HGB 12.5 10/01/2014   HCT 37.6 10/01/2014   PLT 210.0 10/01/2014   GLUCOSE 81 10/01/2014   CHOL 242* 10/01/2014   TRIG 131.0 10/01/2014   HDL 69.60 10/01/2014   LDLDIRECT 136.6 09/19/2012   LDLCALC 146* 10/01/2014   ALT 11 10/01/2014   AST 18 10/01/2014   NA 139 10/01/2014   K 3.7 10/01/2014   CL 104 10/01/2014   CREATININE 0.81 10/01/2014  BUN 11 10/01/2014   CO2 27 10/01/2014   TSH 0.66 10/01/2014    Dg Lumbar Spine Complete  02/16/2015   CLINICAL DATA:  Back pain for 2 months. No known injury. Initial evaluation .  EXAM: LUMBAR SPINE - COMPLETE 4+ VIEW  COMPARISON:  None.  FINDINGS: Paraspinal soft tissues are normal. Pelvic calcifications consistent phleboliths. No acute bony abnormality . No evidence of fracture or malalignment. Diffuse mild  degenerative change present.  IMPRESSION: Diffuse mild degenerative change.  No acute or focal abnormality.   Electronically Signed   By: Marcello Moores  Register   On: 02/16/2015 13:45   Dg Hip Unilat With Pelvis 2-3 Views Left  02/16/2015   CLINICAL DATA:  Hip and back pain.  EXAM: LEFT HIP (WITH PELVIS) 2-3 VIEWS  COMPARISON:  None.  FINDINGS: Degenerative changes lumbar spine and both hips. No acute abnormality identified. No evidence of fracture dislocation. Pelvic calcifications consistent phleboliths.  IMPRESSION: No acute abnormality.   Electronically Signed   By: Marcello Moores  Register   On: 02/16/2015 13:44     Assessment & Plan:  Plan I am having Ms. Brockmann start on predniSONE. I am also having her maintain her traMADol and HYDROcodone-acetaminophen. We administered methylPREDNISolone acetate.  Meds ordered this encounter  Medications  . predniSONE (DELTASONE) 10 MG tablet    Sig: 3 po qd for 3 days then 2 po qd for 3 days the 1 po qd for 3 days    Dispense:  18 tablet    Refill:  0  . methylPREDNISolone acetate (DEPO-MEDROL) injection 40 mg    Sig:     Problem List Items Addressed This Visit    None    Visit Diagnoses    Encounter for screening mammogram for breast cancer    -  Primary    Relevant Orders    MM DIGITAL SCREENING BILATERAL    Myalgia and myositis        Relevant Medications    predniSONE (DELTASONE) 10 MG tablet    methylPREDNISolone acetate (DEPO-MEDROL) injection 40 mg (Completed)    Other Relevant Orders    Basic metabolic panel    CBC with Differential/Platelet    Hepatic function panel    POCT urinalysis dipstick    TSH    Vitamin D 1,25 dihydroxy    ANA    Rheumatoid factor    Sedimentation rate    Rocky mtn spotted fvr ab, IgM-blood    B. burgdorfi antibodies       Follow-up: Return if symptoms worsen or fail to improve.  Garnet Koyanagi, DO

## 2015-04-06 NOTE — Patient Instructions (Signed)

## 2015-04-06 NOTE — Telephone Encounter (Signed)
Apt scheduled for today.      KP

## 2015-04-06 NOTE — Telephone Encounter (Signed)
Caller name: [name] Relationship to patient: Self  Can be reached: [phone]  Pharmacy:  Reason for call: returning your call

## 2015-04-07 ENCOUNTER — Other Ambulatory Visit (INDEPENDENT_AMBULATORY_CARE_PROVIDER_SITE_OTHER): Payer: BLUE CROSS/BLUE SHIELD

## 2015-04-07 ENCOUNTER — Encounter: Payer: Self-pay | Admitting: *Deleted

## 2015-04-07 DIAGNOSIS — M791 Myalgia: Secondary | ICD-10-CM | POA: Diagnosis not present

## 2015-04-07 DIAGNOSIS — IMO0001 Reserved for inherently not codable concepts without codable children: Secondary | ICD-10-CM

## 2015-04-07 DIAGNOSIS — M609 Myositis, unspecified: Secondary | ICD-10-CM

## 2015-04-07 LAB — CBC WITH DIFFERENTIAL/PLATELET
BASOS PCT: 0.7 % (ref 0.0–3.0)
Basophils Absolute: 0 10*3/uL (ref 0.0–0.1)
EOS PCT: 0.2 % (ref 0.0–5.0)
Eosinophils Absolute: 0 10*3/uL (ref 0.0–0.7)
HEMATOCRIT: 38.2 % (ref 36.0–46.0)
HEMOGLOBIN: 12.7 g/dL (ref 12.0–15.0)
Lymphocytes Relative: 45.6 % (ref 12.0–46.0)
Lymphs Abs: 2.2 10*3/uL (ref 0.7–4.0)
MCHC: 33.1 g/dL (ref 30.0–36.0)
MCV: 88.9 fl (ref 78.0–100.0)
MONO ABS: 0.3 10*3/uL (ref 0.1–1.0)
Monocytes Relative: 6 % (ref 3.0–12.0)
Neutro Abs: 2.3 10*3/uL (ref 1.4–7.7)
Neutrophils Relative %: 47.5 % (ref 43.0–77.0)
Platelets: 195 10*3/uL (ref 150.0–400.0)
RBC: 4.3 Mil/uL (ref 3.87–5.11)
RDW: 13.6 % (ref 11.5–15.5)
WBC: 4.9 10*3/uL (ref 4.0–10.5)

## 2015-04-07 LAB — HEPATIC FUNCTION PANEL
ALBUMIN: 4.4 g/dL (ref 3.5–5.2)
ALT: 13 U/L (ref 0–35)
AST: 18 U/L (ref 0–37)
Alkaline Phosphatase: 98 U/L (ref 39–117)
BILIRUBIN TOTAL: 0.5 mg/dL (ref 0.2–1.2)
Bilirubin, Direct: 0.1 mg/dL (ref 0.0–0.3)
Total Protein: 7.7 g/dL (ref 6.0–8.3)

## 2015-04-07 LAB — BASIC METABOLIC PANEL
BUN: 14 mg/dL (ref 6–23)
CALCIUM: 9.7 mg/dL (ref 8.4–10.5)
CHLORIDE: 102 meq/L (ref 96–112)
CO2: 29 mEq/L (ref 19–32)
CREATININE: 0.68 mg/dL (ref 0.40–1.20)
GFR: 115.46 mL/min (ref >60.00)
GLUCOSE: 85 mg/dL (ref 70–99)
POTASSIUM: 4.2 meq/L (ref 3.5–5.1)
Sodium: 139 mEq/L (ref 135–145)

## 2015-04-07 LAB — TSH: TSH: 0.85 u[IU]/mL (ref 0.35–4.50)

## 2015-04-07 LAB — RHEUMATOID FACTOR: Rhuematoid fact SerPl-aCnc: 11 IU/mL (ref <=14)

## 2015-04-07 LAB — SEDIMENTATION RATE: SED RATE: 6 mm/h (ref 0–22)

## 2015-04-08 ENCOUNTER — Telehealth: Payer: Self-pay | Admitting: Family Medicine

## 2015-04-08 LAB — ROCKY MTN SPOTTED FVR AB, IGM-BLOOD: ROCKY MTN SPOTTED FEVER, IGM: 0.71 IV

## 2015-04-08 LAB — ANA: Anti Nuclear Antibody(ANA): NEGATIVE

## 2015-04-08 LAB — B. BURGDORFI ANTIBODIES: B BURGDORFERI AB IGG+ IGM: 0.43 {ISR}

## 2015-04-08 NOTE — Telephone Encounter (Signed)
Please advise      KP 

## 2015-04-08 NOTE — Telephone Encounter (Signed)
Can give note for today

## 2015-04-08 NOTE — Telephone Encounter (Signed)
Patient aware Note is ready for pick up.     KP

## 2015-04-08 NOTE — Telephone Encounter (Signed)
Caller name: Kiva Norland  Relation to pt: self Call back number: [phone]   Reason for call:   patient stated she has to leave work today due to hip pain requesting a note excusing her from work for 04/08/15 and 04/09/15. Patient would like to pick up doctor note today.

## 2015-04-12 LAB — VITAMIN D 1,25 DIHYDROXY
VITAMIN D2 1, 25 (OH): 30 pg/mL
Vitamin D 1, 25 (OH)2 Total: 56 pg/mL (ref 18–72)
Vitamin D3 1, 25 (OH)2: 26 pg/mL

## 2015-05-31 ENCOUNTER — Encounter: Payer: Self-pay | Admitting: Family

## 2015-05-31 ENCOUNTER — Other Ambulatory Visit (HOSPITAL_BASED_OUTPATIENT_CLINIC_OR_DEPARTMENT_OTHER): Payer: BLUE CROSS/BLUE SHIELD

## 2015-05-31 ENCOUNTER — Telehealth: Payer: Self-pay | Admitting: *Deleted

## 2015-05-31 ENCOUNTER — Ambulatory Visit (INDEPENDENT_AMBULATORY_CARE_PROVIDER_SITE_OTHER): Payer: BLUE CROSS/BLUE SHIELD | Admitting: Family

## 2015-05-31 ENCOUNTER — Telehealth: Payer: Self-pay | Admitting: Family Medicine

## 2015-05-31 VITALS — BP 120/82 | HR 64 | Temp 98.0°F | Resp 16 | Ht 65.0 in | Wt 185.2 lb

## 2015-05-31 DIAGNOSIS — Z23 Encounter for immunization: Secondary | ICD-10-CM

## 2015-05-31 DIAGNOSIS — M25562 Pain in left knee: Secondary | ICD-10-CM | POA: Diagnosis not present

## 2015-05-31 NOTE — Progress Notes (Signed)
Pre visit review using our clinic review tool, if applicable. No additional management support is needed unless otherwise documented below in the visit note. 

## 2015-05-31 NOTE — Telephone Encounter (Signed)
Received call from imaging dept stating they no longer had 6:30pm opening for doppler as previously thought and the ER would not be able to accommodate pt. They can do scan tomorrow at 10:30am.  Spoke with PCP and notified pt per verbal. Ok to wait until tomorrow for scan and she develops severe pain or shortness of breath through the night that she should be evaluated at [facility] ER. Pt agreeable to wait until tomorrow and voices understanding.

## 2015-05-31 NOTE — Patient Instructions (Addendum)
Continue celebrex for joint pain.  Call if symptoms worsen or do not improve. Please complete ultrasound of your leg on the first floor.

## 2015-05-31 NOTE — Progress Notes (Signed)
   Subjective:    Patient ID: [name], female    DOB: 1961/02/02, 54 y.o.   MRN: [id number]  HPI  [name] is a 54 yr old female who presents today with complaint of swelling behind the left knee. Flew to [facility] a few weeks ago.  She also has some left hip pain. "like a throbbing/numbing pain. Denies CP or SOB.  Reports her mother died from blood clots.     Review of Systems    see HPI  Past Medical History  Diagnosis Date  . Arthritis     rheumatoid per pt    Social History   Social History  . Marital Status: Divorced    Spouse Name: N/A  . Number of Children: 1  . Years of Education: N/A   Occupational History  . [employer]   Social History Main Topics  . Smoking status: Never Smoker   . Smokeless tobacco: Never Used  . Alcohol Use: No  . Drug Use: No  . Sexual Activity:    Partners: Male    Birth Control/ Protection: Surgical   Other Topics Concern  . Not on file   Social History Narrative    Past Surgical History  Procedure Laterality Date  . Obstructed bile duct  2004    Dr Sharmaine Base  . Abdominal hysterectomy  2001    TAH/BSO fibroids  . Rotator cuff repair  12/2010    Right  . Oophorectomy      BSO    Family History  Problem Relation Age of Onset  . Heart disease Mother 3    MI  . Stroke Mother   . Hypertension Mother   . Arthritis Mother   . Hypertension Father   . Arthritis Sister   . Colon cancer Neg Hx   . Colon polyps Neg Hx     No Known Allergies  No current outpatient prescriptions on file prior to visit.   No current facility-administered medications on file prior to visit.    BP 120/82 mmHg  Pulse 64  Temp(Src) 98 F (36.7 C) (Oral)  Resp 16  Ht 5\' 5"  (1.651 m)  Wt 185 lb 3.2 oz (84.006 kg)  BMI 30.82 kg/m2  SpO2 99%    Objective:   Physical Exam  Constitutional: She is oriented to person, place, and time. She appears well-developed and well-nourished. No distress.  HENT:  Head:  Normocephalic and atraumatic.  Cardiovascular: Normal rate and regular rhythm.   No murmur heard. Bilateral varicose veins noted posterior knees  Pulmonary/Chest: Effort normal and breath sounds normal. No respiratory distress. She has no wheezes. She has no rales. She exhibits no tenderness.  Musculoskeletal: She exhibits no edema.  Mild tenderness to palpation behind left knee.    Neurological: She is alert and oriented to person, place, and time.  Psychiatric: She has a normal mood and affect. Her behavior is normal. Judgment and thought content normal.          Assessment & Plan:  Left leg pain- new. pt is concerned about blood clot.  Clinically, I doubt this. However she does have a family hx of clot.  I will obtain US of the leg to rule out.    Continue celebrex for joint pain. Follow up if symptoms worsen or do not improve.   Addendum:  [place] negative for clot, small cyst is noted.

## 2015-05-31 NOTE — Telephone Encounter (Signed)
Opened in error

## 2015-06-01 ENCOUNTER — Ambulatory Visit (HOSPITAL_BASED_OUTPATIENT_CLINIC_OR_DEPARTMENT_OTHER)
Admission: RE | Admit: 2015-06-01 | Discharge: 2015-06-01 | Disposition: A | Discharge status: Home / Self Care | Payer: BLUE CROSS/BLUE SHIELD | Source: Ambulatory Visit | Attending: Family | Admitting: Family

## 2015-06-01 ENCOUNTER — Telehealth: Payer: Self-pay | Admitting: *Deleted

## 2015-06-01 DIAGNOSIS — M7122 Synovial cyst of popliteal space [Baker], left knee: Secondary | ICD-10-CM | POA: Insufficient documentation

## 2015-06-01 DIAGNOSIS — M79662 Pain in left lower leg: Secondary | ICD-10-CM | POA: Insufficient documentation

## 2015-06-01 DIAGNOSIS — M25562 Pain in left knee: Secondary | ICD-10-CM

## 2015-06-01 NOTE — Telephone Encounter (Signed)
Please see result note 

## 2015-06-01 NOTE — Telephone Encounter (Signed)
Received call from [place] in imaging with venous doppler. Patient is negative for DVT but does have a small simple, non-specific cyst in left popliteal area.  Pt is aware no dvt found.  Please advise

## 2015-06-01 NOTE — Telephone Encounter (Signed)
Pt notified per result note.

## 2015-06-08 ENCOUNTER — Ambulatory Visit
Admission: RE | Admit: 2015-06-08 | Discharge: 2015-06-08 | Disposition: A | Discharge status: Home / Self Care | Payer: BLUE CROSS/BLUE SHIELD | Source: Ambulatory Visit | Attending: Family Medicine | Admitting: Family Medicine

## 2015-06-08 DIAGNOSIS — Z1231 Encounter for screening mammogram for malignant neoplasm of breast: Secondary | ICD-10-CM

## 2015-07-19 ENCOUNTER — Ambulatory Visit (INDEPENDENT_AMBULATORY_CARE_PROVIDER_SITE_OTHER): Payer: BLUE CROSS/BLUE SHIELD | Admitting: Gynecology

## 2015-07-19 ENCOUNTER — Encounter: Payer: Self-pay | Admitting: Gynecology

## 2015-07-19 VITALS — BP 120/76 | Ht 65.0 in | Wt 186.0 lb

## 2015-07-19 DIAGNOSIS — Z01419 Encounter for gynecological examination (general) (routine) without abnormal findings: Secondary | ICD-10-CM | POA: Diagnosis not present

## 2015-07-19 DIAGNOSIS — Z113 Encounter for screening for infections with a predominantly sexual mode of transmission: Secondary | ICD-10-CM

## 2015-07-19 DIAGNOSIS — N907 Vulvar cyst: Secondary | ICD-10-CM

## 2015-07-19 NOTE — Progress Notes (Signed)
[name] 08-19-1961 [id number]        54 y.o.  G1P1001  No LMP recorded. Patient has had a hysterectomy. for annual exam.  Doing well without complaints.  Past medical history,surgical history, problem list, medications, allergies, family history and social history were all reviewed and documented as reviewed in the EPIC chart.  ROS:  Performed with pertinent positives and negatives included in the history, assessment and plan.   Additional significant findings :  none   Exam: [name] Vitals:   07/19/15 1453  BP: 120/76  Height: 5\' 5"  (1.651 m)  Weight: 186 lb (84.369 kg)   General appearance:  Normal affect, orientation and appearance. Skin: Grossly normal HEENT: Without gross lesions.  No cervical or supraclavicular adenopathy. Thyroid normal.  Lungs:  Clear without wheezing, rales or rhonchi Cardiac: RR, without RMG Abdominal:  Soft, nontender, without masses, guarding, rebound, organomegaly or hernia Breasts:  Examined lying and sitting without masses, retractions, discharge or axillary adenopathy. Pelvic:  Ext/BUS/vagina with atrophic changes. 2 small classic sebaceous cyst left labia majora as diagrammed. Physical Exam  Genitourinary:        Adnexa  Without masses or tenderness    Anus and perineum  Normal   Rectovaginal  Normal sphincter tone without palpated masses or tenderness.    Assessment/Plan:  53 y.o. G32P1001 female for annual exam.   1. Status post TAH/BSO for leiomyoma 2001. Doing well without significant hot flushes, night sweats or vaginal dryness. 2. 2 small classic sebaceous cysts left labia majora present times years unchanged. Not bothersome to the patient. We'll continue with observation. Report any issues of enlargement or becomes tender. 3. Mammography 06/2015. Continue with annual mammography. SBE monthly reviewed. 4. DEXA 2014 normal. Repeated age 89. Increased calcium vitamin D reviewed. 5. Colonoscopy 2014. Repeat at their  recommended interval. 6. History of stress incontinence. Not overly bothersome to the patient. We previously spoke about options and she is comfortable with observation. 7. Requests HIV screening. No known exposure but just wants to be screened. HIV ordered. 8. Health maintenance. No routine blood work done as patient reports this done at her primary physician's office. Follow up 1 year, sooner as needed.   [name], 3:14 PM 07/19/2015

## 2015-07-19 NOTE — Patient Instructions (Signed)

## 2015-07-20 LAB — HIV ANTIBODY (ROUTINE TESTING W REFLEX): HIV 1&2 Ab, 4th Generation: NONREACTIVE

## 2016-02-29 ENCOUNTER — Telehealth: Payer: Self-pay | Admitting: Family Medicine

## 2016-02-29 NOTE — Telephone Encounter (Signed)
To MD FYI..

## 2016-02-29 NOTE — Telephone Encounter (Signed)
Pt is overdue for ov anyway

## 2016-02-29 NOTE — Telephone Encounter (Signed)
Called patient to schedule OV. Patient states that she had her BP taken again today and it was down to 138/87. States she now believe it was the seasoning and salt that she has been using. Will call back and schedule appointment if it goes up again

## 2016-02-29 NOTE — Telephone Encounter (Signed)
°  Relationship to patient: Self Can be reached: [phone]     Reason for call: Patient request call back to discuss having her BP taken yesterday and it was slightly elevated. Reading 150/80 at the drug store. Wants to know if she needs to do anything different. Plse adv.

## 2016-02-29 NOTE — Telephone Encounter (Signed)
She needs to be seen.      KP 

## 2016-05-24 DIAGNOSIS — H524 Presbyopia: Secondary | ICD-10-CM | POA: Diagnosis not present

## 2016-05-24 DIAGNOSIS — H2511 Age-related nuclear cataract, right eye: Secondary | ICD-10-CM | POA: Diagnosis not present

## 2016-06-15 ENCOUNTER — Other Ambulatory Visit: Payer: Self-pay | Admitting: Gynecology

## 2016-06-15 DIAGNOSIS — Z1231 Encounter for screening mammogram for malignant neoplasm of breast: Secondary | ICD-10-CM

## 2016-06-30 ENCOUNTER — Ambulatory Visit
Admission: RE | Admit: 2016-06-30 | Discharge: 2016-06-30 | Disposition: A | Discharge status: Home / Self Care | Payer: BLUE CROSS/BLUE SHIELD | Source: Ambulatory Visit | Attending: Gynecology | Admitting: Gynecology

## 2016-06-30 DIAGNOSIS — Z1231 Encounter for screening mammogram for malignant neoplasm of breast: Secondary | ICD-10-CM | POA: Diagnosis not present

## 2016-07-18 DIAGNOSIS — H2511 Age-related nuclear cataract, right eye: Secondary | ICD-10-CM | POA: Diagnosis not present

## 2016-07-18 DIAGNOSIS — H25811 Combined forms of age-related cataract, right eye: Secondary | ICD-10-CM | POA: Diagnosis not present

## 2016-07-19 ENCOUNTER — Encounter: Payer: BLUE CROSS/BLUE SHIELD | Admitting: Gynecology

## 2016-07-19 DIAGNOSIS — H2511 Age-related nuclear cataract, right eye: Secondary | ICD-10-CM | POA: Diagnosis not present

## 2016-07-21 ENCOUNTER — Encounter: Payer: Self-pay | Admitting: Gynecology

## 2016-07-21 ENCOUNTER — Ambulatory Visit (INDEPENDENT_AMBULATORY_CARE_PROVIDER_SITE_OTHER): Payer: BLUE CROSS/BLUE SHIELD | Admitting: Gynecology

## 2016-07-21 VITALS — BP 120/78 | Ht 65.0 in | Wt 191.0 lb

## 2016-07-21 DIAGNOSIS — N3946 Mixed incontinence: Secondary | ICD-10-CM | POA: Diagnosis not present

## 2016-07-21 DIAGNOSIS — Z01411 Encounter for gynecological examination (general) (routine) with abnormal findings: Secondary | ICD-10-CM | POA: Diagnosis not present

## 2016-07-21 DIAGNOSIS — Z113 Encounter for screening for infections with a predominantly sexual mode of transmission: Secondary | ICD-10-CM | POA: Diagnosis not present

## 2016-07-21 DIAGNOSIS — N907 Vulvar cyst: Secondary | ICD-10-CM | POA: Diagnosis not present

## 2016-07-21 DIAGNOSIS — N952 Postmenopausal atrophic vaginitis: Secondary | ICD-10-CM

## 2016-07-21 DIAGNOSIS — Z23 Encounter for immunization: Secondary | ICD-10-CM

## 2016-07-21 NOTE — Patient Instructions (Signed)
Office will contact you to arrange for the urology appointment  Schedule a vulvar biopsy appointment if you want the cysts removed.  You may obtain a copy of any labs that were done today by logging onto MyChart as outlined in the instructions provided with your AVS (after visit summary). The office will not call with normal lab results but certainly if there are any significant abnormalities then we will contact you.   Health Maintenance Adopting a healthy lifestyle and getting preventive care can go a long way to promote health and wellness. Talk with your health care provider about what schedule of regular examinations is right for you. This is a good chance for you to check in with your provider about disease prevention and staying healthy. In between checkups, there are plenty of things you can do on your own. Experts have done a lot of research about which lifestyle changes and preventive measures are most likely to keep you healthy. Ask your health care provider for more information. WEIGHT AND DIET  Eat a healthy diet  Be sure to include plenty of vegetables, fruits, low-fat dairy products, and lean protein.  Do not eat a lot of foods high in solid fats, added sugars, or salt.  Get regular exercise. This is one of the most important things you can do for your health.  Most adults should exercise for at least 150 minutes each week. The exercise should increase your heart rate and make you sweat (moderate-intensity exercise).  Most adults should also do strengthening exercises at least twice a week. This is in addition to the moderate-intensity exercise.  Maintain a healthy weight  Body mass index (BMI) is a measurement that can be used to identify possible weight problems. It estimates body fat based on height and weight. Your health care provider can help determine your BMI and help you achieve or maintain a healthy weight.  For females 59 years of age and older:   A BMI below  18.5 is considered underweight.  A BMI of 18.5 to 24.9 is normal.  A BMI of 25 to 29.9 is considered overweight.  A BMI of 30 and above is considered obese.  Watch levels of cholesterol and blood lipids  You should start having your blood tested for lipids and cholesterol at 56 years of age, then have this test every 5 years.  You may need to have your cholesterol levels checked more often if:  Your lipid or cholesterol levels are high.  You are older than 56 years of age.  You are at high risk for heart disease.  CANCER SCREENING   Lung Cancer  Lung cancer screening is recommended for adults 64-16 years old who are at high risk for lung cancer because of a history of smoking.  A yearly low-dose CT scan of the lungs is recommended for people who:  Currently smoke.  Have quit within the past 15 years.  Have at least a 30-pack-year history of smoking. A pack year is smoking an average of one pack of cigarettes a day for 1 year.  Yearly screening should continue until it has been 15 years since you quit.  Yearly screening should stop if you develop a health problem that would prevent you from having lung cancer treatment.  Breast Cancer  Practice breast self-awareness. This means understanding how your breasts normally appear and feel.  It also means doing regular breast self-exams. Let your health care provider know about any changes, no matter how small.  If you are in your 20s or 30s, you should have a clinical breast exam (CBE) by a health care provider every 1-3 years as part of a regular health exam.  If you are 90 or older, have a CBE every year. Also consider having a breast X-ray (mammogram) every year.  If you have a family history of breast cancer, talk to your health care provider about genetic screening.  If you are at high risk for breast cancer, talk to your health care provider about having an MRI and a mammogram every year.  Breast cancer gene (BRCA)  assessment is recommended for women who have family members with BRCA-related cancers. BRCA-related cancers include:  Breast.  Ovarian.  Tubal.  Peritoneal cancers.  Results of the assessment will determine the need for genetic counseling and BRCA1 and BRCA2 testing. Cervical Cancer Routine pelvic examinations to screen for cervical cancer are no longer recommended for nonpregnant women who are considered low risk for cancer of the pelvic organs (ovaries, uterus, and vagina) and who do not have symptoms. A pelvic examination may be necessary if you have symptoms including those associated with pelvic infections. Ask your health care provider if a screening pelvic exam is right for you.   The Pap test is the screening test for cervical cancer for women who are considered at risk.  If you had a hysterectomy for a problem that was not cancer or a condition that could lead to cancer, then you no longer need Pap tests.  If you are older than 65 years, and you have had normal Pap tests for the past 10 years, you no longer need to have Pap tests.  If you have had past treatment for cervical cancer or a condition that could lead to cancer, you need Pap tests and screening for cancer for at least 20 years after your treatment.  If you no longer get a Pap test, assess your risk factors if they change (such as having a new sexual partner). This can affect whether you should start being screened again.  Some women have medical problems that increase their chance of getting cervical cancer. If this is the case for you, your health care provider may recommend more frequent screening and Pap tests.  The human papillomavirus (HPV) test is another test that may be used for cervical cancer screening. The HPV test looks for the virus that can cause cell changes in the cervix. The cells collected during the Pap test can be tested for HPV.  The HPV test can be used to screen women 42 years of age and older.  Getting tested for HPV can extend the interval between normal Pap tests from three to five years.  An HPV test also should be used to screen women of any age who have unclear Pap test results.  After 56 years of age, women should have HPV testing as often as Pap tests.  Colorectal Cancer  This type of cancer can be detected and often prevented.  Routine colorectal cancer screening usually begins at 56 years of age and continues through 56 years of age.  Your health care provider may recommend screening at an earlier age if you have risk factors for colon cancer.  Your health care provider may also recommend using home test kits to check for hidden blood in the stool.  A small camera at the end of a tube can be used to examine your colon directly (sigmoidoscopy or colonoscopy). This is done to check for  the earliest forms of colorectal cancer.  Routine screening usually begins at age 71.  Direct examination of the colon should be repeated every 5-10 years through 56 years of age. However, you may need to be screened more often if early forms of precancerous polyps or small growths are found. Skin Cancer  Check your skin from head to toe regularly.  Tell your health care provider about any new moles or changes in moles, especially if there is a change in a mole's shape or color.  Also tell your health care provider if you have a mole that is larger than the size of a pencil eraser.  Always use sunscreen. Apply sunscreen liberally and repeatedly throughout the day.  Protect yourself by wearing long sleeves, pants, a wide-brimmed hat, and sunglasses whenever you are outside. HEART DISEASE, DIABETES, AND HIGH BLOOD PRESSURE   Have your blood pressure checked at least every 1-2 years. High blood pressure causes heart disease and increases the risk of stroke.  If you are between 84 years and 71 years old, ask your health care provider if you should take aspirin to prevent  strokes.  Have regular diabetes screenings. This involves taking a blood sample to check your fasting blood sugar level.  If you are at a normal weight and have a low risk for diabetes, have this test once every three years after 56 years of age.  If you are overweight and have a high risk for diabetes, consider being tested at a younger age or more often. PREVENTING INFECTION  Hepatitis B  If you have a higher risk for hepatitis B, you should be screened for this virus. You are considered at high risk for hepatitis B if:  You were born in a country where hepatitis B is common. Ask your health care provider which countries are considered high risk.  Your parents were born in a high-risk country, and you have not been immunized against hepatitis B (hepatitis B vaccine).  You have HIV or AIDS.  You use needles to inject street drugs.  You live with someone who has hepatitis B.  You have had sex with someone who has hepatitis B.  You get hemodialysis treatment.  You take certain medicines for conditions, including cancer, organ transplantation, and autoimmune conditions. Hepatitis C  Blood testing is recommended for:  Everyone born from [birth year] through 1966.  Anyone with known risk factors for hepatitis C. Sexually transmitted infections (STIs)  You should be screened for sexually transmitted infections (STIs) including gonorrhea and chlamydia if:  You are sexually active and are younger than 56 years of age.  You are older than 56 years of age and your health care provider tells you that you are at risk for this type of infection.  Your sexual activity has changed since you were last screened and you are at an increased risk for chlamydia or gonorrhea. Ask your health care provider if you are at risk.  If you do not have HIV, but are at risk, it may be recommended that you take a prescription medicine daily to prevent HIV infection. This is called pre-exposure prophylaxis  (PrEP). You are considered at risk if:  You are sexually active and do not regularly use condoms or know the HIV status of your partner(s).  You take drugs by injection.  You are sexually active with a partner who has HIV. Talk with your health care provider about whether you are at high risk of being infected with HIV. If you  choose to begin PrEP, you should first be tested for HIV. You should then be tested every 3 months for as long as you are taking PrEP.  PREGNANCY   If you are premenopausal and you may become pregnant, ask your health care provider about preconception counseling.  If you may become pregnant, take 400 to 800 micrograms (mcg) of folic acid every day.  If you want to prevent pregnancy, talk to your health care provider about birth control (contraception). OSTEOPOROSIS AND MENOPAUSE   Osteoporosis is a disease in which the bones lose minerals and strength with aging. This can result in serious bone fractures. Your risk for osteoporosis can be identified using a bone density scan.  If you are 31 years of age or older, or if you are at risk for osteoporosis and fractures, ask your health care provider if you should be screened.  Ask your health care provider whether you should take a calcium or vitamin D supplement to lower your risk for osteoporosis.  Menopause may have certain physical symptoms and risks.  Hormone replacement therapy may reduce some of these symptoms and risks. Talk to your health care provider about whether hormone replacement therapy is right for you.  HOME CARE INSTRUCTIONS   Schedule regular health, dental, and eye exams.  Stay current with your immunizations.   Do not use any tobacco products including cigarettes, chewing tobacco, or electronic cigarettes.  If you are pregnant, do not drink alcohol.  If you are breastfeeding, limit how much and how often you drink alcohol.  Limit alcohol intake to no more than 1 drink per day for  nonpregnant women. One drink equals 12 ounces of beer, 5 ounces of wine, or 1 ounces of hard liquor.  Do not use street drugs.  Do not share needles.  Ask your health care provider for help if you need support or information about quitting drugs.  Tell your health care provider if you often feel depressed.  Tell your health care provider if you have ever been abused or do not feel safe at home. Document Released: 03/06/2011 Document Revised: 01/05/2014 Document Reviewed: 07/23/2013 [facility] Patient Information 2015 [place], [place]. This information is not intended to replace advice given to you by your health care provider. Make sure you discuss any questions you have with your health care provider.

## 2016-07-21 NOTE — Progress Notes (Signed)
[name] 09/16/1960 [id number]        55 y.o.  G1P1001  for annual exam.  Complaining of 2 additional issues:  1. Worsening urinary incontinence. Has a history of urinary continence in the past but it seems to be getting worse with both a stress pattern with loss of urine with coughing sneezing laughing as well as an urgency component with feeling like she has to go to the bathroom rushes but can't quite make it. No UTI symptoms such as frequency dysuria or low back pain. Had tried over-the-counter tampon-like inserts which seemed to help a little bit but not to the degree that she hoped for. 2. History of vulvar cysts left labia majora which seem to be getting slightly larger and more bothersome to her. She notices him with wiping and bathing and it bothers her.  Past medical history,surgical history, problem list, medications, allergies, family history and social history were all reviewed and documented as reviewed in the EPIC chart.  ROS:  Performed with pertinent positives and negatives included in the history, assessment and plan.   Additional significant findings :  None   Exam: [name] assistant Vitals:   07/21/16 1149  BP: 120/78  Weight: 191 lb (86.6 kg)  Height: 5\' 5"  (1.651 m)   Body mass index is 31.78 kg/m.  General appearance:  Normal affect, orientation and appearance. Skin: Grossly normal HEENT: Without gross lesions.  No cervical or supraclavicular adenopathy. Thyroid normal.  Lungs:  Clear without wheezing, rales or rhonchi Cardiac: RR, without RMG Abdominal:  Soft, nontender, without masses, guarding, rebound, organomegaly or hernia Breasts:  Examined lying and sitting without masses, retractions, discharge or axillary adenopathy. Pelvic:  Ext, BUS, Vagina with atrophic changes. 2 classic appearing sebaceous cysts left labia majora. No concerning changes on exam. No significant cystocele, rectocele or cuff prolapse.  Adnexa without masses or  tenderness    Anus and perineum normal   Rectovaginal normal sphincter tone without palpated masses or tenderness.    Assessment/Plan:  55 y.o. 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   1. Postmenopausal. No significant hot flushes, night sweats, vaginal dryness. Continue to monitor report any issues. 2. Sebaceous cysts left labia majora. Has been present for years but increasingly bothersome to the patient. Appear classic in appearance without concerning changes. I reviewed options to include incision/excision or observation. Was involved with the incision/excision procedure discussed. Recommended the patient schedule an appointment if she would like to have these removed or drained. She is otherwise comfortable continue to follow them should then she will follow them and follow up if they significantly changed. 3. Mixed incontinence. Getting worse historically. Check baseline UA. Has both a stress and urgency component. Has tried Kegel's as well as OTC inserts for urethral support which do not seem to work well for her. Without significant cystocele, rectocele or cuff prolapse. Recommended urologic evaluation possible physical therapy up to including surgery reviewed. Patient's interested in pursuing this will go ahead and help her make that appointment. 4. Mammography 06/2016. Continue with annual mammography when due. SBE monthly reviewed. 5. DEXA 2014 normal. Plan repeat at age 26. 30. Colonoscopy 2012. Recommended repeat interval reported at 10 years. 7. Requests HIV screening. No known exposure but once screened. Test ordered today. 8. Pap smear 2015. No Pap smear done today. No history of significant abnormal Pap smears. Options to stop screening altogether based on hysterectomy history and current recommendations versus  less frequent screening intervals reviewed. Will readdress on annual basis. 9. Health maintenance. No routine blood work done as  patient reports this done elsewhere. Follow up 1 year, sooner as needed.  Greater than 10 minutes of my time in excess of her routine gynecologic exam was spent in direct face to face counseling and coordination of care in regards to her worsening urinary incontinence and management of her sebaceous cysts.    [name], 12:14 PM 07/21/2016

## 2016-07-21 NOTE — Addendum Note (Signed)
Addended by: Nelva Nay on: 07/21/2016 12:23 PM   Modules accepted: Orders

## 2016-07-22 LAB — URINALYSIS W MICROSCOPIC + REFLEX CULTURE
BILIRUBIN URINE: NEGATIVE
Bacteria, UA: NONE SEEN [HPF]
CASTS: NONE SEEN [LPF]
CRYSTALS: NONE SEEN [HPF]
Glucose, UA: NEGATIVE
HGB URINE DIPSTICK: NEGATIVE
KETONES UR: NEGATIVE
Leukocytes, UA: NEGATIVE
Nitrite: NEGATIVE
PH: 5.5 (ref 5.0–8.0)
Protein, ur: NEGATIVE
RBC / HPF: NONE SEEN RBC/HPF (ref <=2)
SQUAMOUS EPITHELIAL / LPF: NONE SEEN [HPF] (ref <=5)
Specific Gravity, Urine: 1.019 (ref 1.001–1.035)
WBC, UA: NONE SEEN WBC/HPF (ref <=5)
Yeast: NONE SEEN [HPF]

## 2016-07-22 LAB — HIV ANTIBODY (ROUTINE TESTING W REFLEX): HIV 1&2 Ab, 4th Generation: NONREACTIVE

## 2016-12-24 ENCOUNTER — Emergency Department (HOSPITAL_COMMUNITY)
Admission: EM | Admit: 2016-12-24 | Discharge: 2016-12-24 | Disposition: A | Discharge status: Home / Self Care | Payer: BLUE CROSS/BLUE SHIELD | Attending: Emergency Medicine | Admitting: Emergency Medicine

## 2016-12-24 ENCOUNTER — Encounter (HOSPITAL_COMMUNITY): Payer: Self-pay | Admitting: Emergency Medicine

## 2016-12-24 DIAGNOSIS — J029 Acute pharyngitis, unspecified: Secondary | ICD-10-CM | POA: Diagnosis not present

## 2016-12-24 LAB — RAPID STREP SCREEN (MED CTR MEBANE ONLY): STREPTOCOCCUS, GROUP A SCREEN (DIRECT): NEGATIVE

## 2016-12-24 MED ORDER — PREDNISONE 10 MG PO TABS: 20.0000 mg | ORAL_TABLET | Freq: Every day | ORAL | 0 refills | Status: DC

## 2016-12-24 MED ORDER — IBUPROFEN 200 MG PO TABS
600.0000 mg | ORAL_TABLET | Freq: Once | ORAL | Status: AC
  Administered 2016-12-24: 600 mg via ORAL
  Filled 2016-12-24: qty 3

## 2016-12-24 NOTE — ED Triage Notes (Signed)
Patient states that since last Wed she been having sore throat, sinus pain and congestion, chills.

## 2016-12-24 NOTE — Discharge Instructions (Signed)
The rapid strep test today was negative. Throat culture has been sent. It may take a few days to come back. If there is any abnormal results will be called.  Take the steroids as directed.  You may alternate ibuprofen and Tylenol for relief of her pain.  Make sure you are drinking lots of fluids and staying adequately hydrated.  Follow-up with your primary care doctor in 24-48 hours.  Return to the emergency department if he states any worsening sore throat, high fever, difficulty swallowing, increased drooling, or difficult breathing

## 2016-12-24 NOTE — ED Provider Notes (Signed)
[facility] DEPT Provider Note   CSN: [id number] Arrival date & time: 12/24/16  0809     History   Chief Complaint Chief Complaint  Patient presents with  . Sore Throat  . Facial Pain    HPI [name] is a 56 y.o. female who presents with progressively worsening sore throat, congestion, cough x 4 days.  Her pain is worse with swallowing and eating. She has been able to tolerate by mouth and has control of her secretions. She reports that cough is productive of clear phlegm. She's been taking over-the-counter cough medication with no improvement of symptoms. She is evaluated by her primary care doctor for evaluation of symptoms. She reports some subjective fever last night but no recorded temperature. She denies any shortness of breath, chest pain, nausea, vomiting,  The history is provided by the patient.    Past Medical History:  Diagnosis Date  . Arthritis    rheumatoid per pt    Patient Active Problem List   Diagnosis Date Noted  . Back pain 02/16/2015  . Obesity (BMI 30-39.9) 06/09/2013  . Osteoarthritis   . Preventative health care 03/25/2011  . OSTEOARTHRITIS 12/15/2009  . 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 SBO 07/01/2007  . HERNIA, UMBILICAL 36/14/4315    Past Surgical History:  Procedure Laterality Date  . ABDOMINAL HYSTERECTOMY  2001   TAH/BSO fibroids  . CATARACT EXTRACTION    . obstructed bile duct  2004   Dr Sharmaine Base  . OOPHORECTOMY     BSO  . ROTATOR CUFF REPAIR  12/2010   Right    OB History    Gravida Para Term Preterm AB Living   1 1 1     1    SAB TAB Ectopic Multiple Live Births                   Home Medications    Prior to Admission medications   Medication Sig Start Date End Date Taking? Authorizing Provider  predniSONE (DELTASONE) 10 MG tablet Take 2 tablets (20 mg total) by mouth daily. 12/24/16   Volanda Napoleon, PA-C    Family History Family History  Problem Relation Age of Onset  . Heart  disease Mother 80    MI  . Stroke Mother   . Hypertension Mother   . Arthritis Mother   . Pulmonary embolism Mother   . Hypertension Father   . Arthritis Sister   . Colon cancer Neg Hx   . Colon polyps Neg Hx     Social History Social History  Substance Use Topics  . Smoking status: Never Smoker  . Smokeless tobacco: Never Used  . Alcohol use No     Allergies   Patient has no known allergies.   Review of Systems Review of Systems  Constitutional: Negative for fever.  HENT: Positive for congestion and sore throat.   Respiratory: Positive for cough. Negative for shortness of breath.   Cardiovascular: Negative for chest pain.  Gastrointestinal: Negative for abdominal pain, constipation, diarrhea, nausea and vomiting.  Genitourinary: Negative for dysuria and hematuria.  Neurological: Negative for headaches.  All other systems reviewed and are negative.    Physical Exam Updated Vital Signs BP 128/88 (BP Location: Right Arm)   Pulse 75   Temp 98.8 F (37.1 C) (Oral)   Resp 18   SpO2 100%   Physical Exam  Constitutional: She appears well-developed and well-nourished.  HENT:  Head: Normocephalic and atraumatic.  Nose: Right  sinus exhibits no maxillary sinus tenderness and no frontal sinus tenderness. Left sinus exhibits no maxillary sinus tenderness and no frontal sinus tenderness.  Mouth/Throat: Uvula is midline and mucous membranes are normal. Posterior oropharyngeal edema and posterior oropharyngeal erythema present. No oropharyngeal exudate.  No facial swelling. No trismus. No gingival swelling. Uvula is midline. No evidence of peritonsillar abscess seen.  Eyes: Conjunctivae and EOM are normal. Right eye exhibits no discharge. Left eye exhibits no discharge. No scleral icterus.  Cardiovascular: Normal rate and regular rhythm.   Pulmonary/Chest: Effort normal and breath sounds normal.  Musculoskeletal: She exhibits no deformity.  Lymphadenopathy:    She has no  cervical adenopathy.  Neurological: She is alert.  Skin: Skin is warm and dry.  Psychiatric: She has a normal mood and affect. Her speech is normal and behavior is normal.     ED Treatments / Results  Labs (all labs ordered are listed, but only abnormal results are displayed) Labs Reviewed  RAPID STREP SCREEN (NOT AT [facility]  CULTURE, GROUP A STREP [facility])    EKG  EKG Interpretation None       Radiology No results found.  Procedures Procedures (including critical care time)  Medications Ordered in ED Medications  ibuprofen (ADVIL,MOTRIN) tablet 600 mg (600 mg Oral Given 12/24/16 1224)     Initial Impression / Assessment and Plan / ED Course  I have reviewed the triage vital signs and the nursing notes.  Pertinent labs & imaging results that were available during my care of the patient were reviewed by me and considered in my medical decision making (see chart for details).     56 year old female who presents with 4 days of worsening sore throat, cough, congestion. Does go exam with erythematous posterior oropharynx. No exudates noted. Uvula is midline no signs of peritonsillar abscess. Patient is afebrile, non-toxic appearing, sitting comfortably on examination table. Consider pharyngitis vs URI. Will plan to check rapid strep in the emergency department.  Rapid strep negative. Throat culture sent. Discussed patient with Dr. Tyrone Nine.   Discussed results with patient. Explained her that although the rapid strep is negative that the throat culture may still come back positive. It will take a few days to result but that if there is any abnormality she will get a call. She is tolerating by mouth in the department. Patient is stable for discharge at this time. Plan to send her home with short course of steroids for treatment of inflammation. Instructed patient to follow-up with PCP in 2 days. Return precautions discussed. Patient expresses understanding and agreement to plan.      Final Clinical Impressions(s) / ED Diagnoses   Final diagnoses:  Pharyngitis, unspecified etiology    New Prescriptions Discharge Medication List as of 12/24/2016 12:40 PM    START taking these medications   Details  predniSONE (DELTASONE) 10 MG tablet Take 2 tablets (20 mg total) by mouth daily., Starting Sun 12/24/2016, Print         Volanda Napoleon, PA-C 12/24/16 [place], DO 12/26/16 1118

## 2016-12-27 LAB — CULTURE, GROUP A STREP (THRC)

## 2017-01-17 ENCOUNTER — Encounter: Payer: Self-pay | Admitting: Gynecology

## 2017-02-01 DIAGNOSIS — Z961 Presence of intraocular lens: Secondary | ICD-10-CM | POA: Diagnosis not present

## 2017-02-01 DIAGNOSIS — H53002 Unspecified amblyopia, left eye: Secondary | ICD-10-CM | POA: Diagnosis not present

## 2017-02-01 DIAGNOSIS — H40011 Open angle with borderline findings, low risk, right eye: Secondary | ICD-10-CM | POA: Diagnosis not present

## 2017-02-12 ENCOUNTER — Other Ambulatory Visit: Payer: Self-pay | Admitting: Gynecology

## 2017-02-12 ENCOUNTER — Ambulatory Visit (INDEPENDENT_AMBULATORY_CARE_PROVIDER_SITE_OTHER): Payer: BLUE CROSS/BLUE SHIELD | Admitting: Gynecology

## 2017-02-12 ENCOUNTER — Encounter: Payer: Self-pay | Admitting: Gynecology

## 2017-02-12 VITALS — BP 122/78

## 2017-02-12 DIAGNOSIS — N3001 Acute cystitis with hematuria: Secondary | ICD-10-CM | POA: Diagnosis not present

## 2017-02-12 LAB — URINALYSIS W MICROSCOPIC + REFLEX CULTURE
Bilirubin Urine: NEGATIVE
CASTS: NONE SEEN [LPF]
CRYSTALS: NONE SEEN [HPF]
Glucose, UA: NEGATIVE
Ketones, ur: NEGATIVE
Nitrite: NEGATIVE
Specific Gravity, Urine: 1.015 (ref 1.001–1.035)
WBC, UA: >60 WBC/HPF — AB (ref <=5)
YEAST: NONE SEEN [HPF]
pH: 7 (ref 5.0–8.0)

## 2017-02-12 MED ORDER — SULFAMETHOXAZOLE-TRIMETHOPRIM 800-160 MG PO TABS: 1.0000 | ORAL_TABLET | Freq: Two times a day (BID) | ORAL | 0 refills | Status: DC

## 2017-02-12 NOTE — Progress Notes (Signed)
    [name] 1960-11-24 [id number]        56 y.o.  G1P1001 presents with 2 day history of frequency, dysuria and some blood in her urine. No urgency, low back pain, fever or chills. No overt precipitating events other than exercising frequently and wearing gym clothes  Past medical history,surgical history, problem list, medications, allergies, family history and social history were all reviewed and documented in the EPIC chart.  Directed ROS with pertinent positives and negatives documented in the history of present illness/assessment and plan.  Exam: Vitals:   02/12/17 1217  BP: 122/78   General appearance:  Normal Spine straight without CVA tenderness Abdomen soft with mild suprapubic tenderness. No masses guarding rebound   Assessment/Plan:  56 y.o. G1P1001  with history and urinalysis consistent with UTI. Greater than 60 wbc per high-powered field, 10-20 RBC, moderate bacteria. Will treat with Septra DS 1 by mouth twice a day 3 days. Follow up on culture. Follow up if symptoms persist, worsen or recur.  [name], 12:41 PM 02/12/2017

## 2017-02-12 NOTE — Addendum Note (Signed)
Addended by: Nelva Nay on: 02/12/2017 02:05 PM   Modules accepted: Orders

## 2017-02-12 NOTE — Patient Instructions (Signed)
Take antibiotic twice daily for three days

## 2017-02-14 LAB — URINE CULTURE

## 2017-04-27 ENCOUNTER — Ambulatory Visit: Payer: BLUE CROSS/BLUE SHIELD | Admitting: Family Medicine

## 2017-04-30 ENCOUNTER — Encounter: Payer: Self-pay | Admitting: Family Medicine

## 2017-04-30 ENCOUNTER — Ambulatory Visit (HOSPITAL_BASED_OUTPATIENT_CLINIC_OR_DEPARTMENT_OTHER)
Admission: RE | Admit: 2017-04-30 | Discharge: 2017-04-30 | Disposition: A | Discharge status: Home / Self Care | Payer: BLUE CROSS/BLUE SHIELD | Source: Ambulatory Visit | Attending: Family Medicine | Admitting: Family Medicine

## 2017-04-30 ENCOUNTER — Ambulatory Visit (INDEPENDENT_AMBULATORY_CARE_PROVIDER_SITE_OTHER): Payer: BLUE CROSS/BLUE SHIELD | Admitting: Family Medicine

## 2017-04-30 VITALS — BP 110/80 | HR 67 | Temp 98.0°F | Ht 65.0 in | Wt 169.0 lb

## 2017-04-30 DIAGNOSIS — M25552 Pain in left hip: Secondary | ICD-10-CM

## 2017-04-30 DIAGNOSIS — R1032 Left lower quadrant pain: Secondary | ICD-10-CM | POA: Diagnosis not present

## 2017-04-30 LAB — POCT URINALYSIS DIPSTICK
Bilirubin, UA: NEGATIVE
Blood, UA: NEGATIVE
GLUCOSE UA: NEGATIVE
KETONES UA: NEGATIVE
LEUKOCYTES UA: NEGATIVE
NITRITE UA: NEGATIVE
Protein, UA: NEGATIVE
Spec Grav, UA: >=1.03 — AB (ref 1.010–1.025)
Urobilinogen, UA: 0.2 E.U./dL
pH, UA: 6 (ref 5.0–8.0)

## 2017-04-30 MED ORDER — HYDROCODONE-ACETAMINOPHEN 5-325 MG PO TABS: 1.0000 | ORAL_TABLET | Freq: Four times a day (QID) | ORAL | 0 refills | Status: DC | PRN
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
## 2017-04-30 NOTE — Assessment & Plan Note (Signed)
Check xray Rest-- ice/ heat

## 2017-04-30 NOTE — Patient Instructions (Signed)
Blunt Abdominal Trauma Blunt abdominal trauma is a type of injury that involves damage to the abdominal wall or to abdominal organs, such as the liver or spleen. The damage can involve bruising, tearing, or a rupture. This type of injury does not involve a puncture of the skin. Blunt abdominal trauma can range from mild to severe. In some cases it can lead to a severe abdominal inflammation (peritonitis), severe bleeding, and a dangerous drop in blood pressure. What are the causes? This injury is caused by a hard, direct hit to the abdomen. It can happen after:  A motor vehicle accident.  Being kicked or punched in the abdomen.  Falling from a significant height.  What increases the risk? This injury is more likely to happen in people who:  Play contact sports.  Work in a job in which falls or injuries are more likely, such as in Architect.  What are the signs or symptoms? The main symptom of this condition is pain in the abdomen. Other symptoms depend on the type and location of the injury. They can include:  Abdominal pain that spreads to the the back or shoulder.  Bruising.  Swelling.  Pain when pressing on the abdomen.  Blood in the urine.  Weakness.  Confusion.  Loss of consciousness.  Pale, dusky, cool, or sweaty skin.  Vomiting blood.  Bloody stool or bleeding from the rectum.  Trouble breathing.  Symptoms of this injury can develop suddenly or slowly. How is this diagnosed? This injury is diagnosed based on your symptoms and a physical exam. You may also have tests, including:  Blood tests.  Urine tests.  Imaging tests, such as: ? A CT scan and ultrasound of your abdomen. ? X-rays of your chest and abdomen.  A test in which a tube is used to flush your abdomen with fluid and check for blood (diagnostic peritoneal lavage).  How is this treated? Treatment for this injury depends on its type and severity. Treatment options include:  Observation.  If the injury is mild, this may be the only treatment needed.  Support of your blood pressure and breathing.  Getting blood, fluids, or medicine through an IV tube.  Antibiotic medicine.  Insertion of tubes into the stomach or bladder.  A blood transfusion.  A procedure to stop bleeding. This involves putting a long, thin tube (catheter) into one of your blood vessels (angiographic embolization).  Surgery to open up your abdomen and control bleeding or repair damage (laparotomy). This may be done if tests suggest that you have peritonitis or bleeding that cannot be controlled with angiographic embolization.  Follow these instructions at home:  Take medicines only as directed by your health care provider.  If you were prescribed an antibiotic medicine, finish all of it even if you start to feel better.  Follow your health care provider's instructions about diet and activity restrictions.  Keep all follow-up visits as directed by your health care provider. This is important. Contact a health care provider if:  You continue to have abdominal pain.  Your symptoms return.  You develop new symptoms.  You have blood in your urine or your bowel movements. Get help right away if:  You vomit blood.  You have heavy bleeding from your rectum.  You have very bad abdominal pain.  You have trouble breathing.  You have chest pain.  You have a fever.  You have dizziness.  You pass out. This information is not intended to replace advice given to you  by your health care provider. Make sure you discuss any questions you have with your health care provider. Document Released: 09/28/2004 Document Revised: 12/30/2015 Document Reviewed: 08/12/2014 Elsevier Interactive Patient Education  [employer].

## 2017-05-05 ENCOUNTER — Encounter (HOSPITAL_BASED_OUTPATIENT_CLINIC_OR_DEPARTMENT_OTHER): Payer: Self-pay

## 2017-05-05 ENCOUNTER — Ambulatory Visit (HOSPITAL_BASED_OUTPATIENT_CLINIC_OR_DEPARTMENT_OTHER)
Admission: RE | Admit: 2017-05-05 | Discharge: 2017-05-05 | Disposition: A | Discharge status: Home / Self Care | Payer: BLUE CROSS/BLUE SHIELD | Source: Ambulatory Visit | Attending: Family Medicine | Admitting: Family Medicine

## 2017-05-05 DIAGNOSIS — R1032 Left lower quadrant pain: Secondary | ICD-10-CM

## 2017-05-05 DIAGNOSIS — R102 Pelvic and perineal pain: Secondary | ICD-10-CM | POA: Diagnosis not present

## 2017-05-05 MED ORDER — IOPAMIDOL (ISOVUE-300) INJECTION 61%
100.0000 mL | Freq: Once | INTRAVENOUS | Status: AC | PRN
  Administered 2017-05-05: 100 mL via INTRAVENOUS

## 2017-05-11 ENCOUNTER — Encounter: Payer: Self-pay | Admitting: Family Medicine

## 2017-05-11 ENCOUNTER — Ambulatory Visit (INDEPENDENT_AMBULATORY_CARE_PROVIDER_SITE_OTHER): Payer: BLUE CROSS/BLUE SHIELD | Admitting: Family Medicine

## 2017-05-11 VITALS — BP 102/72 | HR 65 | Temp 98.1°F | Ht 65.0 in | Wt 173.6 lb

## 2017-05-11 DIAGNOSIS — M25552 Pain in left hip: Secondary | ICD-10-CM | POA: Diagnosis not present

## 2017-05-11 MED ORDER — PREDNISONE 10 MG PO TABS
ORAL_TABLET | ORAL | 0 refills | Status: DC
Start: 2017-05-11 — End: 2017-09-19

## 2017-05-11 MED ORDER — CYCLOBENZAPRINE HCL 10 MG PO TABS: 10.0000 mg | ORAL_TABLET | Freq: Three times a day (TID) | ORAL | 0 refills | Status: DC | PRN

## 2017-05-11 NOTE — Patient Instructions (Signed)
Bursitis Bursitis is inflammation and irritation of a bursa, which is one of the small, fluid-filled sacs that cushion and protect the moving parts of your body. These sacs are located between bones and muscles, muscle attachments, or skin areas next to bones. A bursa protects these structures from the wear and tear that results from frequent movement. An inflamed bursa causes pain and swelling. Fluid may build up inside the sac. Bursitis is most common near joints, especially the knees, elbows, hips, and shoulders. What are the causes? Bursitis can be caused by:  Injury from: ? A direct blow, like falling on your knee or elbow. ? Overuse of a joint (repetitive stress).  Infection. This can happen if bacteria gets into a bursa through a cut or scrape near a joint.  Diseases that cause joint inflammation, such as gout and rheumatoid arthritis.  What increases the risk? You may be at risk for bursitis if you:  Have a job or hobby that involves a lot of repetitive stress on your joints.  Have a condition that weakens your body's defense system (immune system), such as diabetes, cancer, or HIV.  Lift and reach overhead often.  Kneel or lean on hard surfaces often.  Run or walk often.  What are the signs or symptoms? The most common signs and symptoms of bursitis are:  Pain that gets worse when you move the affected body part or put weight on it.  Inflammation.  Stiffness.  Other signs and symptoms may include:  Redness.  Tenderness.  Warmth.  Pain that continues after rest.  Fever and chills. This may occur in bursitis caused by infection.  How is this diagnosed? Bursitis may be diagnosed by:  Medical history and physical exam.  MRI.  A procedure to drain fluid from the bursa with a needle (aspiration). The fluid may be checked for signs of infection or gout.  Blood tests to rule out other causes of inflammation.  How is this treated? Bursitis can usually be  treated at home with rest, ice, compression, and elevation (RICE). For mild bursitis, RICE treatment may be all you need. Other treatments may include:  Nonsteroidal anti-inflammatory drugs (NSAIDs) to treat pain and inflammation.  Corticosteroids to fight inflammation. You may have these drugs injected into and around the area of bursitis.  Aspiration of bursitis fluid to relieve pain and improve movement.  Antibiotic medicine to treat an infected bursa.  A splint, brace, or walking aid.  Physical therapy if you continue to have pain or limited movement.  Surgery to remove a damaged or infected bursa. This may be needed if you have a very bad case of bursitis or if other treatments have not worked.  Follow these instructions at home:  Take medicines only as directed by your health care provider.  If you were prescribed an antibiotic medicine, finish it all even if you start to feel better.  Rest the affected area as directed by your health care provider. ? Keep the area elevated. ? Avoid activities that make pain worse.  Apply ice to the injured area: ? Place ice in a plastic bag. ? Place a towel between your skin and the bag. ? Leave the ice on for 20 minutes, 2-3 times a day.  Use splints, braces, pads, or walking aids as directed by your health care provider.  Keep all follow-up visits as directed by your health care provider. This is important. How is this prevented?  Wear knee pads if you kneel often.    Wear sturdy running or walking shoes that fit you well.  Take regular breaks from repetitive activity.  Warm up by stretching before doing any strenuous activity.  Maintain a healthy weight or lose weight as recommended by your health care provider. Ask your health care provider if you need help.  Exercise regularly. Start any new physical activity gradually. Contact a health care provider if:  Your bursitis is not responding to treatment or home care.  You have  a fever.  You have chills. This information is not intended to replace advice given to you by your health care provider. Make sure you discuss any questions you have with your health care provider. Document Released: 08/18/2000 Document Revised: 01/27/2016 Document Reviewed: 11/10/2013 Elsevier Interactive Patient Education  2018 [employer]  

## 2017-05-11 NOTE — Progress Notes (Signed)
Patient ID: [name], female    DOB: 10-14-1960  Age: 56 y.o. MRN: [id number]    Subjective:  Subjective  HPI [name] presents for f/u L hip pain.  It has no  Review of Systems  Constitutional: Negative for appetite change, diaphoresis, fatigue and unexpected weight change.  Eyes: Negative for pain, redness and visual disturbance.  Respiratory: Negative for cough, chest tightness, shortness of breath and wheezing.   Cardiovascular: Negative for chest pain, palpitations and leg swelling.  Endocrine: Negative for cold intolerance, heat intolerance, polydipsia, polyphagia and polyuria.  Genitourinary: Negative for difficulty urinating, dysuria and frequency.  Musculoskeletal: Positive for arthralgias and gait problem.  Neurological: Negative for dizziness, light-headedness, numbness and headaches.    History Past Medical History:  Diagnosis Date  . Arthritis    rheumatoid per pt    She has a past surgical history that includes obstructed bile duct (2004); Abdominal hysterectomy (2001); Rotator cuff repair (12/2010); Oophorectomy; and Cataract extraction.   Her family history includes Arthritis in her mother and sister; Heart disease (age of onset: 1) in her mother; Hypertension in her father and mother; Pulmonary embolism in her mother; Stroke in her mother.She reports that she has never smoked. She has never used smokeless tobacco. She reports that she does not drink alcohol or use drugs.  Current Outpatient Prescriptions on File Prior to Visit  Medication Sig Dispense Refill  . HYDROcodone-acetaminophen (NORCO/VICODIN) 5-325 MG tablet Take 1 tablet by mouth every 6 (six) hours as needed for moderate pain. (Patient not taking: Reported on 05/11/2017) 20 tablet 0   No current facility-administered medications on file prior to visit.      Objective:  Objective  Physical Exam  Constitutional: She is oriented to person, place, and time. She appears well-developed and  well-nourished.  HENT:  Head: Normocephalic and atraumatic.  Eyes: Conjunctivae and EOM are normal.  Neck: Normal range of motion. Neck supple. No JVD present. Carotid bruit is not present. No thyromegaly present.  Cardiovascular: Normal rate, regular rhythm and normal heart sounds.   No murmur heard. Pulmonary/Chest: Effort normal and breath sounds normal. No respiratory distress. She has no wheezes. She has no rales. She exhibits no tenderness.  Musculoskeletal: She exhibits tenderness. She exhibits no edema.       Left hip: She exhibits tenderness. She exhibits normal strength, no bony tenderness, no swelling, no crepitus and no deformity.  Neurological: She is alert and oriented to person, place, and time.  Psychiatric: She has a normal mood and affect.   BP 102/72 (BP Location: Left Arm, Patient Position: Sitting, Cuff Size: Normal)   Pulse 65   Temp 98.1 F (36.7 C) (Oral)   Ht 5\' 5"  (1.651 m)   Wt 173 lb 9.6 oz (78.7 kg)   SpO2 97%   BMI 28.89 kg/m  Wt Readings from Last 3 Encounters:  05/11/17 173 lb 9.6 oz (78.7 kg)  04/30/17 169 lb (76.7 kg)  07/21/16 191 lb (86.6 kg)     Lab Results  Component Value Date   WBC 4.9 04/07/2015   HGB 12.7 04/07/2015   HCT 38.2 04/07/2015   PLT 195.0 04/07/2015   GLUCOSE 85 04/07/2015   CHOL 242 (H) 10/01/2014   TRIG 131.0 10/01/2014   HDL 69.60 10/01/2014   LDLDIRECT 136.6 09/19/2012   LDLCALC 146 (H) 10/01/2014   ALT 13 04/07/2015   AST 18 04/07/2015   NA 139 04/07/2015   K 4.2 04/07/2015   CL 102 04/07/2015  CREATININE 0.68 04/07/2015   BUN 14 04/07/2015   CO2 29 04/07/2015   TSH 0.85 04/07/2015    Ct Abdomen Pelvis W Contrast  Result Date: 05/05/2017 CLINICAL DATA:  Left lower pelvic pain. EXAM: CT ABDOMEN AND PELVIS WITH CONTRAST TECHNIQUE: Multidetector CT imaging of the abdomen and pelvis was performed using the standard protocol following bolus administration of intravenous contrast. CONTRAST:  15mL ISOVUE-300  IOPAMIDOL (ISOVUE-300) INJECTION 61% COMPARISON:  November 19, 2003 FINDINGS: Lower chest: No acute abnormality. Hepatobiliary: No focal liver abnormality is seen. No gallstones, gallbladder wall thickening, or biliary dilatation. Pancreas: Unremarkable. No pancreatic ductal dilatation or surrounding inflammatory changes. Spleen: Normal in size without focal abnormality. Adrenals/Urinary Tract: Adrenal glands are unremarkable. Kidneys are normal, without renal calculi, focal lesion, or hydronephrosis. Bladder is unremarkable. Stomach/Bowel: Stomach is within normal limits. The appendix is not seen but no inflammatory changes noted around cecum. No evidence of bowel wall thickening, distention, or inflammatory changes. Vascular/Lymphatic: No significant vascular findings are present. No enlarged abdominal or pelvic lymph nodes. Reproductive: Patient status post prior hysterectomy. The ovaries are normal. Other: None. Musculoskeletal: Degenerative joint changes of the spine are noted. IMPRESSION: No acute abnormality identified in the abdomen and pelvis. Electronically Signed   By: Abelardo Diesel M.D.   On: 05/05/2017 14:18     Assessment & Plan:  Plan  I am having Ms. Portilla start on predniSONE and cyclobenzaprine. I am also having her maintain her HYDROcodone-acetaminophen.  Meds ordered this encounter  Medications  . predniSONE (DELTASONE) 10 MG tablet    Sig: TAKE 3 TABLETS PO QD FOR 3 DAYS THEN TAKE 2 TABLETS PO QD FOR 3 DAYS THEN TAKE 1 TABLET PO QD FOR 3 DAYS THEN TAKE 1/2 TAB PO QD FOR 3 DAYS    Dispense:  20 tablet    Refill:  0  . cyclobenzaprine (FLEXERIL) 10 MG tablet    Sig: Take 1 tablet (10 mg total) by mouth 3 (three) times daily as needed for muscle spasms.    Dispense:  30 tablet    Refill:  0    Problem List Items Addressed This Visit    None    Visit Diagnoses    Left hip pain    -  Primary   Relevant Medications   predniSONE (DELTASONE) 10 MG tablet   cyclobenzaprine  (FLEXERIL) 10 MG tablet   Other Relevant Orders   Ambulatory referral to Sports Medicine      Follow-up: Return if symptoms worsen or fail to improve.  Ann Held, DO

## 2017-05-14 ENCOUNTER — Encounter: Payer: Self-pay | Admitting: Family Medicine

## 2017-05-29 ENCOUNTER — Encounter: Payer: Self-pay | Admitting: Family Medicine

## 2017-05-29 ENCOUNTER — Ambulatory Visit: Payer: Self-pay

## 2017-05-29 ENCOUNTER — Ambulatory Visit (INDEPENDENT_AMBULATORY_CARE_PROVIDER_SITE_OTHER): Payer: BLUE CROSS/BLUE SHIELD | Admitting: Family Medicine

## 2017-05-29 VITALS — BP 122/82 | HR 76 | Ht 65.0 in | Wt 174.0 lb

## 2017-05-29 DIAGNOSIS — M25552 Pain in left hip: Secondary | ICD-10-CM

## 2017-05-29 DIAGNOSIS — S7002XA Contusion of left hip, initial encounter: Secondary | ICD-10-CM | POA: Diagnosis not present

## 2017-05-29 DIAGNOSIS — M7062 Trochanteric bursitis, left hip: Secondary | ICD-10-CM

## 2017-05-29 DIAGNOSIS — S7012XA Contusion of left thigh, initial encounter: Secondary | ICD-10-CM

## 2017-05-29 DIAGNOSIS — M5416 Radiculopathy, lumbar region: Secondary | ICD-10-CM | POA: Diagnosis not present

## 2017-05-29 MED ORDER — DICLOFENAC SODIUM 2 % TD SOLN: 2.0000 "application " | Freq: Two times a day (BID) | TRANSDERMAL | 3 refills | Status: DC

## 2017-05-29 MED ORDER — GABAPENTIN 100 MG PO CAPS: 200.0000 mg | ORAL_CAPSULE | Freq: Every day | ORAL | 3 refills | Status: DC

## 2017-05-29 NOTE — Progress Notes (Signed)
[name] [place], [place] [address] Phone: [phone] Subjective:      CC: left hip pain   [id number]  [name] is a 56 y.o. female coming in with complaint of left hip pain. She has been having pain for 3 months. She said she fell off of a mechanical bull and fell on the side of that hip. She has been taking steroids and muscle relaxer for the pain.Most of her pain is on the outside of her hip and into the anterior thigh. She has a hard time sleeping at night and notes that "electricity" shoots through her leg.   Onset- 3 months ago Location- left hip  Duration- intermittent Character- dull Aggravating factors-  Reliving factors-  Therapies tried-  Severity-5 out of 10 in severity   patient did have a CT abdomen and pelvis done secondary to pain by primary care provider. This isn't apparently visualized by me showing no significant bony normality. Patient also had x-rays of the left hip done. Also no significant bony normality's.  Past Medical History:  Diagnosis Date  . Arthritis    rheumatoid per pt   Past Surgical History:  Procedure Laterality Date  . ABDOMINAL HYSTERECTOMY  2001   TAH/BSO fibroids  . CATARACT EXTRACTION    . obstructed bile duct  2004   Dr Sharmaine Base  . OOPHORECTOMY     BSO  . ROTATOR CUFF REPAIR  12/2010   Right   Social History   Social History  . Marital status: Divorced    Spouse name: N/A  . Number of children: 1  . Years of education: N/A   Occupational History  . [employer]   Social History Main Topics  . Smoking status: Never Smoker  . Smokeless tobacco: Never Used  . Alcohol use No  . Drug use: No  . Sexual activity: Yes    Partners: Male    Birth control/ protection: Surgical     Comment: 1st intercourse 56 yo-More than 5 partners   Other Topics Concern  . Not on file   Social History Narrative  . No narrative on file   No Known Allergies Family  History  Problem Relation Age of Onset  . Heart disease Mother 67       MI  . Stroke Mother   . Hypertension Mother   . Arthritis Mother   . Pulmonary embolism Mother   . Hypertension Father   . Arthritis Sister   . Colon cancer Neg Hx   . Colon polyps Neg Hx      Past medical history, social, surgical and family history all reviewed in electronic medical record.  No pertanent information unless stated regarding to the chief complaint.   Review of Systems:Review of systems updated and as accurate as of 05/29/17  No headache, visual changes, nausea, vomiting, diarrhea, constipation, dizziness, abdominal pain, skin rash, fevers, chills, night sweats, weight loss, swollen lymph nodes, body aches, joint swelling, muscle aches, chest pain, shortness of breath, mood changes.   Objective  There were no vitals taken for this visit. Systems examined below as of 05/29/17   General: No apparent distress alert and oriented x3 mood and affect normal, dressed appropriately.  HEENT: Pupils equal, extraocular movements intact  Respiratory: Patient's speak in full sentences and does not appear short of breath  Cardiovascular: No lower extremity edema, non tender, no erythema  Skin: Warm dry intact with no signs of  infection or rash on extremities or on axial skeleton.  Abdomen: Soft nontender  Neuro: Cranial nerves II through XII are intact, neurovascularly intact in all extremities with 2+ DTRs and 2+ pulses.  Lymph: No lymphadenopathy of posterior or anterior cervical chain or axillae bilaterally.  Gait normal with good balance and coordination.  MSK:  Non tender with full range of motion and good stability and symmetric strength and tone of shoulders, elbows, wrist, knee and ankles bilaterally.  Hip: Left ROM IR: 45 Deg, ER: 45 Deg, Flexion: 120 Deg, Extension: 100 Deg, Abduction: 45 Deg, Adduction: 45 Deg Strength IR: 5/5, ER: 5/5, Flexion: 5/5, Extension: 5/5, Abduction: 5/5, Adduction:  5/5 Pelvic alignment unremarkable to inspection and palpation. Standing hip rotation and gait without trendelenburg sign / unsteadiness. Greater trochanter with moderate tenderness to palpation. Patient actually has more pain over the paraspinal musculature lumbar spine Moderate to severe greater trochanter. Mild pain with Corky Sox Pain over the left sacroiliac joint as well as the paraspinal musculature of the lumbar spine  Procedure 97110; 15 additional minutes spent for Therapeutic exercises as stated in above notes.  This included exercises focusing on stretching, strengthening, with significant focus on eccentric aspects.   Long term goals include an improvement in range of motion, strength, endurance as well as avoiding reinjury. Patient's frequency would include in 1-2 times a day, 3-5 times a week for a duration of 6-12 weeks. Hip strengthening exercises which included:  Pelvic tilt/bracing to help with proper recruitment of the lower abs and pelvic floor muscles  Glute strengthening to properly contract glutes without over-engaging low back and hamstrings - prone hip extension and glute bridge exercises Proper stretching techniques to increase effectiveness for the hip flexors, groin, quads, piriformic and low back when appropriate    Proper technique shown and discussed handout in great detail with ATC.  All questions were discussed and answered.     Impression and Recommendations:     This case required medical decision making of moderate complexity.      Note: This dictation was prepared with Dragon dictation along with smaller phrase technology. Any transcriptional errors that result from this process are unintentional.

## 2017-05-29 NOTE — Patient Instructions (Addendum)
Good to see you.  Ice 20 minutes 2 times daily. Usually after activity and before bed. Exercises 3 times a week.  pennsaid pinkie amount topically 2 times daily as needed.  Gabapentin 200mg  at night OK to workout but low impact exercises Over the counter Vitamin D 2000 IUdaily  Arnica lotion to the area may help with bruise See me again in 4 weeks.

## 2017-05-29 NOTE — Assessment & Plan Note (Signed)
Patient does have more of a lumbar radiculopathy as well as a greater trochanter bursitis. I do believe from patient's fall that was likely an prone contusion. All anti-inflammatory's prescribed, we discussed icing regimen, which activities doing which ones to avoid. Work with Product/process development scientist to learn home exercises in greater detail. Patient will start to increase activity slowly over the course the next several weeks. Follow-up again in 4 weeks. Worsening symptoms either workup patient's back versus the potential for greater trochanter injection.

## 2017-06-06 ENCOUNTER — Ambulatory Visit: Payer: BLUE CROSS/BLUE SHIELD | Admitting: Family Medicine

## 2017-06-26 ENCOUNTER — Ambulatory Visit (INDEPENDENT_AMBULATORY_CARE_PROVIDER_SITE_OTHER): Payer: BLUE CROSS/BLUE SHIELD | Admitting: Family Medicine

## 2017-06-26 ENCOUNTER — Encounter: Payer: Self-pay | Admitting: Family Medicine

## 2017-06-26 DIAGNOSIS — M7062 Trochanteric bursitis, left hip: Secondary | ICD-10-CM | POA: Diagnosis not present

## 2017-06-26 DIAGNOSIS — M5416 Radiculopathy, lumbar region: Secondary | ICD-10-CM | POA: Diagnosis not present

## 2017-06-26 NOTE — Assessment & Plan Note (Addendum)
Patient unable to tolerate the gabapentin. Patient come back and see me again in 4-6 weeks

## 2017-06-26 NOTE — Assessment & Plan Note (Signed)
Doing 60% better at this time. Still some mild discomfort. We discussed icing regimen and home exercise. Topical anti-inflammatories given. We discussed gabapentin. Patient continue the active. Follow-up again in 4-6 weeks

## 2017-06-26 NOTE — Progress Notes (Signed)
[facility] [place] [place], [place] [address] Phone: [phone] Subjective:    I'm seeing this patient by the request  of:    CC: Left hip pain follow-up  [id number]  [name] is a 56 y.o. female coming in with complaint of left hip pain. N/A more of a greater trochanter bursitis as well as a potential for lumbar radiculopathy. Patient did have x-rays of the pelvis done by her primary care provider showing only minimal osteophytic changes of the hand. Patient states that she is been doing exercises and feeling 60-65% better. Making progress. Patient states that she is been doing the exercises fairly regularly and taking the over-the-counter medications. Happy with the results of far. Feels topical medicine has been helpful      Past Medical History:  Diagnosis Date  . Arthritis    rheumatoid per pt   Past Surgical History:  Procedure Laterality Date  . ABDOMINAL HYSTERECTOMY  2001   TAH/BSO fibroids  . CATARACT EXTRACTION    . obstructed bile duct  2004   Dr Sharmaine Base  . OOPHORECTOMY     BSO  . ROTATOR CUFF REPAIR  12/2010   Right   Social History   Social History  . Marital status: Divorced    Spouse name: N/A  . Number of children: 1  . Years of education: N/A   Occupational History  . [employer]   Social History Main Topics  . Smoking status: Never Smoker  . Smokeless tobacco: Never Used  . Alcohol use No  . Drug use: No  . Sexual activity: Yes    Partners: Male    Birth control/ protection: Surgical     Comment: 1st intercourse 56 yo-More than 5 partners   Other Topics Concern  . None   Social History Narrative  . None   No Known Allergies Family History  Problem Relation Age of Onset  . Heart disease Mother 68       MI  . Stroke Mother   . Hypertension Mother   . Arthritis Mother   . Pulmonary embolism Mother   . Hypertension Father   . Arthritis Sister   . Colon cancer Neg Hx   .  Colon polyps Neg Hx      Past medical history, social, surgical and family history all reviewed in electronic medical record.  No pertanent information unless stated regarding to the chief complaint.   Review of Systems:Review of systems updated and as accurate as of 06/26/17  No headache, visual changes, nausea, vomiting, diarrhea, constipation, dizziness, abdominal pain, skin rash, fevers, chills, night sweats, weight loss, swollen lymph nodes, body aches, joint swelling, muscle aches, chest pain, shortness of breath, mood changes.   Objective  Blood pressure 120/80, pulse 76, height 5\' 5"  (1.651 m), weight 177 lb (80.3 kg), SpO2 97 %. Systems examined below as of 06/26/17   General: No apparent distress alert and oriented x3 mood and affect normal, dressed appropriately.  HEENT: Pupils equal, extraocular movements intact  Respiratory: Patient's speak in full sentences and does not appear short of breath  Cardiovascular: No lower extremity edema, non tender, no erythema  Skin: Warm dry intact with no signs of infection or rash on extremities or on axial skeleton.  Abdomen: Soft nontender  Neuro: Cranial nerves II through XII are intact, neurovascularly intact in all extremities with 2+ DTRs and 2+ pulses.  Lymph: No lymphadenopathy of posterior or anterior cervical  chain or axillae bilaterally.  Gait normal with good balance and coordination.  MSK:  Non tender with full range of motion and good stability and symmetric strength and tone of shoulders, elbows, wrist, knee and ankles bilaterally.  Hip: Left ROM IR: 25 Deg, ER: 45 Deg, Flexion: 120 Deg, Extension: 100 Deg, Abduction: 45 Deg, Adduction: 25 Deg Strength IR: 5/5, ER: 5/5, Flexion: 5/5, Extension: 5/5, Abduction: 4/5, Adduction: 5/5 Pelvic alignment unremarkable to inspection and palpation. Standing hip rotation and gait without trendelenburg sign / unsteadiness. Greater trochanter with mild tenderness No tenderness over  piriformis  Very mild pain with Corky Sox and internal rotation No SI joint tenderness and normal minimal SI movement.    Impression and Recommendations:     This case required medical decision making of moderate complexity.      Note: This dictation was prepared with Dragon dictation along with smaller phrase technology. Any transcriptional errors that result from this process are unintentional.

## 2017-06-26 NOTE — Patient Instructions (Addendum)
Good to see you  Keep it up  Stay active.  pennsaid pinkie amount topically 2 times daily as needed.  See me again in 6 weeks and lets check in.

## 2017-08-07 ENCOUNTER — Other Ambulatory Visit: Payer: Self-pay | Admitting: Gynecology

## 2017-08-07 DIAGNOSIS — Z1231 Encounter for screening mammogram for malignant neoplasm of breast: Secondary | ICD-10-CM

## 2017-08-13 ENCOUNTER — Ambulatory Visit: Payer: BLUE CROSS/BLUE SHIELD

## 2017-09-12 ENCOUNTER — Ambulatory Visit
Admission: RE | Admit: 2017-09-12 | Discharge: 2017-09-12 | Disposition: A | Discharge status: Home / Self Care | Payer: BLUE CROSS/BLUE SHIELD | Source: Ambulatory Visit | Attending: Gynecology | Admitting: Gynecology

## 2017-09-12 DIAGNOSIS — Z1231 Encounter for screening mammogram for malignant neoplasm of breast: Secondary | ICD-10-CM

## 2017-09-19 ENCOUNTER — Encounter: Payer: Self-pay | Admitting: Gynecology

## 2017-09-19 ENCOUNTER — Ambulatory Visit (INDEPENDENT_AMBULATORY_CARE_PROVIDER_SITE_OTHER): Payer: BLUE CROSS/BLUE SHIELD | Admitting: Gynecology

## 2017-09-19 VITALS — BP 120/74 | Ht 65.0 in | Wt 179.0 lb

## 2017-09-19 DIAGNOSIS — N952 Postmenopausal atrophic vaginitis: Secondary | ICD-10-CM | POA: Diagnosis not present

## 2017-09-19 DIAGNOSIS — Z01411 Encounter for gynecological examination (general) (routine) with abnormal findings: Secondary | ICD-10-CM | POA: Diagnosis not present

## 2017-09-19 NOTE — Progress Notes (Addendum)
    [name] [birth year] [id number]        57 y.o.  G1P1001 for annual gynecologic exam.  Doing well from a gynecologic standpoint.  Past medical history,surgical history, problem list, medications, allergies, family history and social history were all reviewed and documented as reviewed in the EPIC chart.  ROS:  Performed with pertinent positives and negatives included in the history, assessment and plan.   Additional significant findings : None   Exam: [name] assistant Vitals:   09/19/17 1116  BP: 120/74  Weight: 179 lb (81.2 kg)  Height: 5\' 5"  (1.651 m)   Body mass index is 29.79 kg/m.  General appearance:  Normal affect, orientation and appearance. Skin: Grossly normal HEENT: Without gross lesions.  No cervical or supraclavicular adenopathy. Thyroid normal.  Lungs:  Clear without wheezing, rales or rhonchi Cardiac: RR, without RMG Abdominal:  Soft, nontender, without masses, guarding, rebound, organomegaly or hernia Breasts:  Examined lying and sitting without masses, retractions, discharge or axillary adenopathy. Pelvic:  Ext, BUS, Vagina: With atrophic changes  Adnexa: Without masses or tenderness    Anus and perineum: Normal   Rectovaginal: Normal sphincter tone without palpated masses or tenderness.    Assessment/Plan:  57 y.o. G77P1001 female for annual gynecologic exam status post TAH/BSO in the past for leiomyoma 2001.   1. Postmenopausal/atrophic genital changes.  No significant hot flushes, night sweats or vaginal dryness. 2. History of urinary incontinence.  Patient notes her symptoms actually are much improved and not an issue now. 3. History of vulvar sebaceous cysts in the past.  Patient notes they have drained and are not an issue now. 4. Mammography 09/2017.  Continue with annual mammography next year.  Breast exam normal today. 5. Pap smear 2015.  No history of significant abnormal Pap smears.  Options to stop screening per current screening  guidelines based on hysterectomy history reviewed.  At this point she prefers to continue with screening and Pap smear was done today. 6. Colonoscopy 2012.  Reported repeat interval 10 years. 7. DEXA 2014 normal.  Plan follow-up DEXA at age 65. 67. Health maintenance.  No routine lab work done as patient does this elsewhere.  Follow-up 1 year, sooner as needed.   [name], 11:52 AM 09/19/2017

## 2017-09-19 NOTE — Patient Instructions (Signed)
Follow-up in 1 year for annual exam, sooner if any issues. 

## 2017-09-19 NOTE — Addendum Note (Signed)
Addended by: Nelva Nay on: 09/19/2017 12:28 PM   Modules accepted: Orders

## 2017-09-21 LAB — PAP IG W/ RFLX HPV ASCU

## 2017-10-25 DIAGNOSIS — H40011 Open angle with borderline findings, low risk, right eye: Secondary | ICD-10-CM | POA: Diagnosis not present

## 2018-02-14 ENCOUNTER — Ambulatory Visit (INDEPENDENT_AMBULATORY_CARE_PROVIDER_SITE_OTHER): Payer: BLUE CROSS/BLUE SHIELD | Admitting: Family Medicine

## 2018-02-14 ENCOUNTER — Encounter: Payer: Self-pay | Admitting: Family Medicine

## 2018-02-14 DIAGNOSIS — M25552 Pain in left hip: Secondary | ICD-10-CM | POA: Diagnosis not present

## 2018-02-14 MED ORDER — NAPROXEN-ESOMEPRAZOLE 500-20 MG PO TBEC: 1.0000 | DELAYED_RELEASE_TABLET | Freq: Two times a day (BID) | ORAL | 3 refills | Status: DC

## 2018-02-14 NOTE — Progress Notes (Signed)
[name] - 57 y.o. female MRN [id number]  Date of birth: Nov 05, 1960  SUBJECTIVE:  Including CC & ROS.  Chief Complaint  Patient presents with  . Left hip pain    [name] is a 57 y.o. female that is here today for left hip pain. She was seen on 06/26/17 for same problem. Pain is located in the groin and then wraps around to the back. No radiation distally. She was applying Pennsaid with no improvement. She feels like the pain has worsened. Denies certain movements that exacerbate the pain. Sitting triggers mild to severe pain. Denies injury or previous surgeries. She has been competing home exercises. Pain limits her activity at the gym.   Independent review of the CT abdomen/pelvis from 2018 shows no structural changes of the hip joints.  Independent review of the left hip x-ray and AP of the pelvis from 2016 shows no abnormalities of the hip joint.   Review of Systems  Constitutional: Negative for fever.  HENT: Negative for congestion.   Respiratory: Negative for cough.   Cardiovascular: Negative for chest pain.  Gastrointestinal: Negative for abdominal pain.  Musculoskeletal: Positive for gait problem.  Skin: Negative for color change.  Neurological: Negative for weakness.  Hematological: Negative for adenopathy.  Psychiatric/Behavioral: Negative for agitation.    HISTORY: Past Medical, Surgical, Social, and Family History Reviewed & Updated per EMR.   Pertinent Historical Findings include:  Past Medical History:  Diagnosis Date  . Arthritis    rheumatoid per pt    Past Surgical History:  Procedure Laterality Date  . ABDOMINAL HYSTERECTOMY  2001   TAH/BSO fibroids  . CATARACT EXTRACTION    . obstructed bile duct  2004   Dr Sharmaine Base  . OOPHORECTOMY     BSO  . ROTATOR CUFF REPAIR  12/2010   Right    No Known Allergies  Family History  Problem Relation Age of Onset  . Heart disease Mother 77       MI  . Stroke Mother   . Hypertension Mother   .  Arthritis Mother   . Pulmonary embolism Mother   . Hypertension Father   . Arthritis Sister   . Colon cancer Neg Hx   . Colon polyps Neg Hx      Social History   Socioeconomic History  . Marital status: Divorced    Spouse name: Not on file  . Number of children: 1  . Years of education: Not on file  . Highest education level: Not on file  Occupational History  . Occupation: MEAT CUTTER    Employer: [employer]  Social Needs  . Financial resource strain: Not on file  . Food insecurity:    Worry: Not on file    Inability: Not on file  . Transportation needs:    Medical: Not on file    Non-medical: Not on file  Tobacco Use  . Smoking status: Never Smoker  . Smokeless tobacco: Never Used  Substance and Sexual Activity  . Alcohol use: No    Alcohol/week: 0.0 oz  . Drug use: No  . Sexual activity: Never    Partners: Male    Birth control/protection: Surgical    Comment: 1st intercourse 57 yo-More than 5 partners  Lifestyle  . Physical activity:    Days per week: Not on file    Minutes per session: Not on file  . Stress: Not on file  Relationships  . Social connections:    Talks on  phone: Not on file    Gets together: Not on file    Attends religious service: Not on file    Active member of club or organization: Not on file    Attends meetings of clubs or organizations: Not on file    Relationship status: Not on file  . Intimate partner violence:    Fear of current or ex partner: Not on file    Emotionally abused: Not on file    Physically abused: Not on file    Forced sexual activity: Not on file  Other Topics Concern  . Not on file  Social History Narrative  . Not on file     PHYSICAL EXAM:  VS: BP 128/74 (BP Location: Right Arm, Patient Position: Sitting, Cuff Size: Normal)   Pulse 68   Temp 98.8 F (37.1 C) (Oral)   Ht 5\' 5"  (1.651 m)   Wt 188 lb (85.3 kg)   SpO2 98%   BMI 31.28 kg/m  Physical Exam Gen: NAD, alert, cooperative with exam,  well-appearing ENT: normal lips, normal nasal mucosa,  Eye: normal EOM, normal conjunctiva and lids CV:  no edema, +2 pedal pulses   Resp: no accessory muscle use, non-labored,  Skin: no rashes, no areas of induration  Neuro: normal tone, normal sensation to touch Psych:  normal insight, alert and oriented MSK:  Right hip:  Normal IR and ER  No TTP over the GT and piriformis  Weakness with hip abduction  Pain with FADIR and FABER.  Normal knee flexion and extension  Mild limp with walking  Neurovascularly intact.       ASSESSMENT & PLAN:   Left hip pain Pain in groin would suggest in an intra-articular problem. Normal imaging and movement. Pain worse with flexion and ER. Possible for labral vs radiating around from the back. Has tried pennsaid with no relief.  - vimovo  - counseled on HEP  - if no improvement consider GT vs intra-articular injection. Could consider MRI to evaluate labrum.

## 2018-02-14 NOTE — Patient Instructions (Signed)
Please try the exercises  Please try physical therapy  Please take the medication for 10 days straight and then as needed  Please follow up in 3-4 weeks if your pain hasn't improved.

## 2018-02-14 NOTE — Assessment & Plan Note (Signed)
Pain in groin would suggest in an intra-articular problem. Normal imaging and movement. Pain worse with flexion and ER. Possible for labral vs radiating around from the back. Has tried pennsaid with no relief.  - vimovo  - counseled on HEP  - if no improvement consider GT vs intra-articular injection. Could consider MRI to evaluate labrum.

## 2018-06-11 ENCOUNTER — Encounter: Payer: Self-pay | Admitting: Family Medicine

## 2018-06-11 ENCOUNTER — Ambulatory Visit (INDEPENDENT_AMBULATORY_CARE_PROVIDER_SITE_OTHER): Payer: BLUE CROSS/BLUE SHIELD | Admitting: Family Medicine

## 2018-06-11 VITALS — BP 128/82 | HR 72 | Temp 99.2°F | Resp 16 | Ht 65.0 in | Wt 191.0 lb

## 2018-06-11 DIAGNOSIS — Z23 Encounter for immunization: Secondary | ICD-10-CM | POA: Diagnosis not present

## 2018-06-11 DIAGNOSIS — M25512 Pain in left shoulder: Secondary | ICD-10-CM

## 2018-06-11 DIAGNOSIS — M25511 Pain in right shoulder: Secondary | ICD-10-CM

## 2018-06-11 MED ORDER — HYDROCODONE-ACETAMINOPHEN 5-325 MG PO TABS
1.0000 | ORAL_TABLET | Freq: Four times a day (QID) | ORAL | 0 refills | Status: DC | PRN
Start: 2018-06-11 — End: 2018-09-18

## 2018-06-11 MED ORDER — METHOCARBAMOL 500 MG PO TABS: 500.0000 mg | ORAL_TABLET | Freq: Four times a day (QID) | ORAL | 0 refills | Status: DC

## 2018-06-11 MED ORDER — PREDNISONE 10 MG PO TABS: ORAL_TABLET | ORAL | 0 refills | Status: DC

## 2018-06-11 MED FILL — predniSONE 10 MG TABS: 10 | 12 days supply | Qty: 20 | Fill #0

## 2018-06-11 MED FILL — METHOCARBAMOL 500 MG TABLET: 500 | 5 days supply | Qty: 20 | Fill #0

## 2018-06-11 MED FILL — HYDROCODON-APAP 5-325: 5-325 | 5 days supply | Qty: 20 | Fill #0

## 2018-06-11 NOTE — Patient Instructions (Signed)
Shoulder Pain Many things can cause shoulder pain, including:  An injury to the area.  Overuse of the shoulder.  Arthritis.  The source of the pain can be:  Inflammation.  An injury to the shoulder joint.  An injury to a tendon, ligament, or bone.  Follow these instructions at home: Take these actions to help with your pain:  Squeeze a soft ball or a foam pad as much as possible. This helps to keep the shoulder from swelling. It also helps to strengthen the arm.  Take over-the-counter and prescription medicines only as told by your health care provider.  If directed, apply ice to the area: ? Put ice in a plastic bag. ? Place a towel between your skin and the bag. ? Leave the ice on for 20 minutes, 2-3 times per day. Stop applying ice if it does not help with the pain.  If you were given a shoulder sling or immobilizer: ? Wear it as told. ? Remove it to shower or bathe. ? Move your arm as little as possible, but keep your hand moving to prevent swelling.  Contact a health care provider if:  Your pain gets worse.  Your pain is not relieved with medicines.  New pain develops in your arm, hand, or fingers. Get help right away if:  Your arm, hand, or fingers: ? Tingle. ? Become numb. ? Become swollen. ? Become painful. ? Turn white or blue. This information is not intended to replace advice given to you by your health care provider. Make sure you discuss any questions you have with your health care provider. Document Released: 05/31/2005 Document Revised: 04/16/2016 Document Reviewed: 12/14/2014 Elsevier Interactive Patient Education  2018 [employer]  

## 2018-06-11 NOTE — Progress Notes (Signed)
Patient ID: [name], female    DOB: 02/11/1961  Age: 57 y.o. MRN: [id number]    Subjective:  Subjective  HPI [name] presents for b/l shoulder pain  She has a hx of shoulder surgery in the past and the pain feels the same as before she ever had surgery.    Review of Systems  Constitutional: Negative for fever.  HENT: Negative for congestion.   Respiratory: Negative for shortness of breath.   Cardiovascular: Negative for chest pain, palpitations and leg swelling.  Gastrointestinal: Negative for abdominal pain, blood in stool and nausea.  Genitourinary: Negative for dysuria and frequency.  Musculoskeletal: Positive for arthralgias. Negative for joint swelling.  Skin: Negative for rash.  Allergic/Immunologic: Negative for environmental allergies.  Neurological: Negative for dizziness and headaches.  Psychiatric/Behavioral: The patient is not nervous/anxious.     History Past Medical History:  Diagnosis Date  . Arthritis    rheumatoid per pt    She has a past surgical history that includes obstructed bile duct (2004); Abdominal hysterectomy (2001); Rotator cuff repair (12/2010); Oophorectomy; and Cataract extraction.   Her family history includes Arthritis in her mother and sister; Heart disease (age of onset: 50) in her mother; Hypertension in her father and mother; Pulmonary embolism in her mother; Stroke in her mother.She reports that she has never smoked. She has never used smokeless tobacco. She reports that she does not drink alcohol or use drugs.  Current Outpatient Medications on File Prior to Visit  Medication Sig Dispense Refill  . Cholecalciferol (VITAMIN D PO) Take by mouth.    . Multiple Vitamin (MULTIVITAMIN) tablet Take 1 tablet by mouth daily.    . Naproxen-Esomeprazole 500-20 MG TBEC Take 1 tablet by mouth 2 (two) times daily. 60 tablet 3   No current facility-administered medications on file prior to visit.      Objective:  Objective  Physical Exam   Constitutional: She is oriented to person, place, and time. She appears well-developed and well-nourished.  HENT:  Head: Normocephalic and atraumatic.  Eyes: Conjunctivae and EOM are normal.  Neck: Normal range of motion. Neck supple. No JVD present. Carotid bruit is not present. No thyromegaly present.  Cardiovascular: Normal rate, regular rhythm and normal heart sounds.  No murmur heard. Pulmonary/Chest: Effort normal and breath sounds normal. No respiratory distress. She has no wheezes. She has no rales. She exhibits no tenderness.  Musculoskeletal: She exhibits tenderness. She exhibits no edema.       Right shoulder: She exhibits decreased range of motion, tenderness and pain.       Left shoulder: She exhibits decreased range of motion, tenderness and pain.       Arms: Neurological: She is alert and oriented to person, place, and time.  Psychiatric: She has a normal mood and affect.  Nursing note and vitals reviewed.  BP 128/82 (BP Location: Right Arm, Cuff Size: Large)   Pulse 72   Temp 99.2 F (37.3 C) (Oral)   Resp 16   Ht 5\' 5"  (1.651 m)   Wt 191 lb (86.6 kg)   SpO2 98%   BMI 31.78 kg/m  Wt Readings from Last 3 Encounters:  06/11/18 191 lb (86.6 kg)  02/14/18 188 lb (85.3 kg)  09/19/17 179 lb (81.2 kg)     Lab Results  Component Value Date   WBC 4.9 04/07/2015   HGB 12.7 04/07/2015   HCT 38.2 04/07/2015   PLT 195.0 04/07/2015   GLUCOSE 85 04/07/2015   CHOL  242 (H) 10/01/2014   TRIG 131.0 10/01/2014   HDL 69.60 10/01/2014   LDLDIRECT 136.6 09/19/2012   LDLCALC 146 (H) 10/01/2014   ALT 13 04/07/2015   AST 18 04/07/2015   NA 139 04/07/2015   K 4.2 04/07/2015   CL 102 04/07/2015   CREATININE 0.68 04/07/2015   BUN 14 04/07/2015   CO2 29 04/07/2015   TSH 0.85 04/07/2015    Mm Screening Breast Tomo Bilateral  Result Date: 09/12/2017 CLINICAL DATA:  Screening. EXAM: 2D DIGITAL SCREENING BILATERAL MAMMOGRAM WITH 3D TOMO WITH CAD COMPARISON:  Previous exam(s).  ACR Breast Density Category b: There are scattered areas of fibroglandular density. FINDINGS: There are no findings suspicious for malignancy. Images were processed with CAD. IMPRESSION: No mammographic evidence of malignancy. A result letter of this screening mammogram will be mailed directly to the patient. RECOMMENDATION: Screening mammogram in one year. (Code:SM-B-01Y) BI-RADS CATEGORY  1: Negative. Electronically Signed   By: Lajean Manes M.D.   On: 09/12/2017 10:37     Assessment & Plan:  Plan  I am having [name] start on predniSONE, HYDROcodone-acetaminophen, and methocarbamol. I am also having her maintain her multivitamin, Cholecalciferol (VITAMIN D PO), and Naproxen-Esomeprazole.  Meds ordered this encounter  Medications  . predniSONE (DELTASONE) 10 MG tablet    Sig: TAKE 3 TABLETS PO QD FOR 3 DAYS THEN TAKE 2 TABLETS PO QD FOR 3 DAYS THEN TAKE 1 TABLET PO QD FOR 3 DAYS THEN TAKE 1/2 TAB PO QD FOR 3 DAYS    Dispense:  20 tablet    Refill:  0  . HYDROcodone-acetaminophen (NORCO/VICODIN) 5-325 MG tablet    Sig: Take 1 tablet by mouth every 6 (six) hours as needed for moderate pain.    Dispense:  20 tablet    Refill:  0  . methocarbamol (ROBAXIN) 500 MG tablet    Sig: Take 1 tablet (500 mg total) by mouth 4 (four) times daily.    Dispense:  20 tablet    Refill:  0    Problem List Items Addressed This Visit    None    Visit Diagnoses    Acute pain of both shoulders    -  Primary   Relevant Medications   predniSONE (DELTASONE) 10 MG tablet   HYDROcodone-acetaminophen (NORCO/VICODIN) 5-325 MG tablet   methocarbamol (ROBAXIN) 500 MG tablet   Other Relevant Orders   Ambulatory referral to Orthopedic Surgery      Follow-up: Return if symptoms worsen or fail to improve.  Ann Held, DO

## 2018-06-12 NOTE — Addendum Note (Signed)
Addended by: Kem Boroughs D on: 06/12/2018 08:21 AM   Modules accepted: Orders

## 2018-06-18 ENCOUNTER — Encounter

## 2018-06-18 ENCOUNTER — Ambulatory Visit: Payer: BLUE CROSS/BLUE SHIELD | Admitting: Family Medicine

## 2018-06-19 DIAGNOSIS — H40011 Open angle with borderline findings, low risk, right eye: Secondary | ICD-10-CM | POA: Diagnosis not present

## 2018-06-19 DIAGNOSIS — H524 Presbyopia: Secondary | ICD-10-CM | POA: Diagnosis not present

## 2018-07-23 IMAGING — CT CT ABD-PELV W/ CM
2 of 5 series · 17 of 46 positions shown, 19 images · IV contrast (APPLIED)
Comparison: November 19, 2003

CLINICAL DATA: Left lower pelvic pain.

EXAM:
CT ABDOMEN AND PELVIS WITH CONTRAST
TECHNIQUE: Multidetector CT imaging of the abdomen and pelvis was performed
using the standard protocol following bolus administration of
intravenous contrast.
CONTRAST:  100mL 12GRR3-BII IOPAMIDOL (12GRR3-BII) INJECTION 61%

[Series 2: axial st · axial · 0.74mm/px · z∈[-395,+15]mm · 14 of 92 slices shown, 16 images]
[im 5/92  soft-tissue]
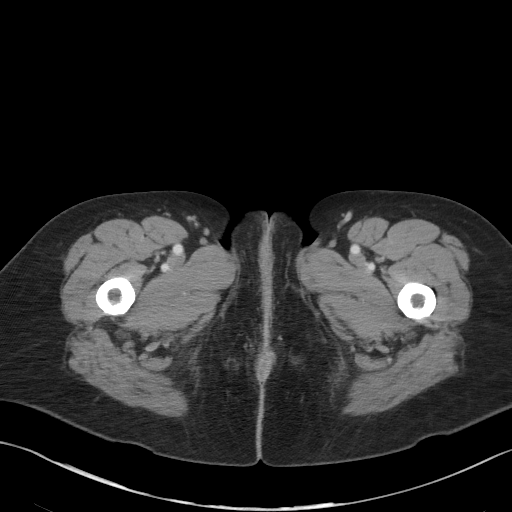
[im 5/92  bone]
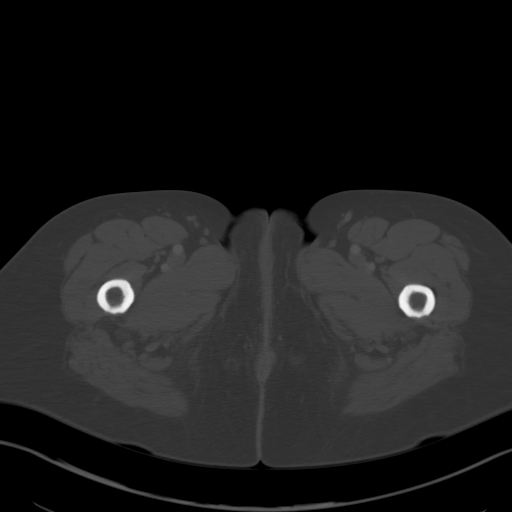
[im 14/92  soft-tissue]
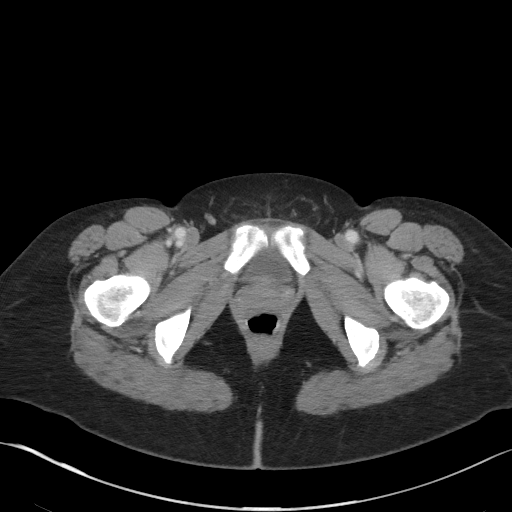
[im 19/92  soft-tissue]
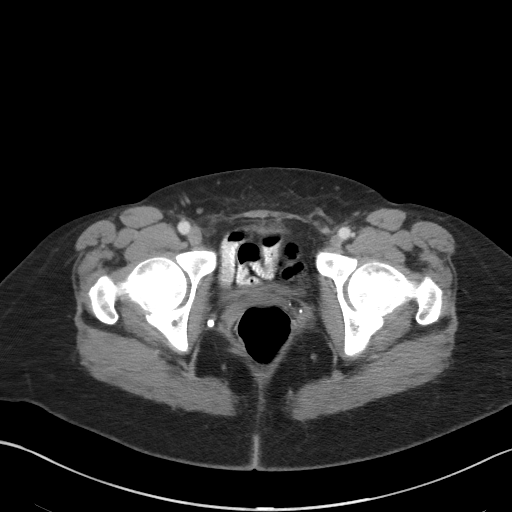
[im 23/92  soft-tissue]
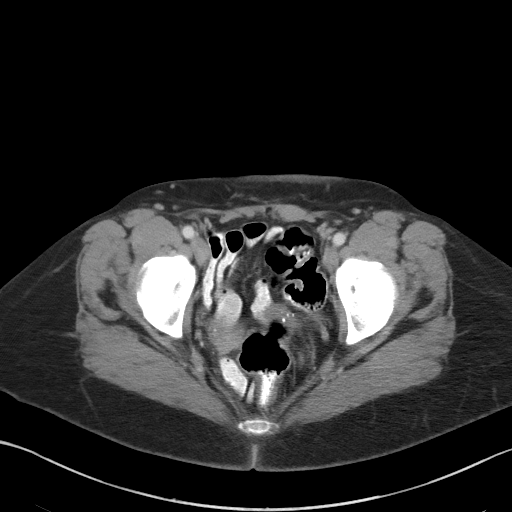
[im 32/92  soft-tissue]
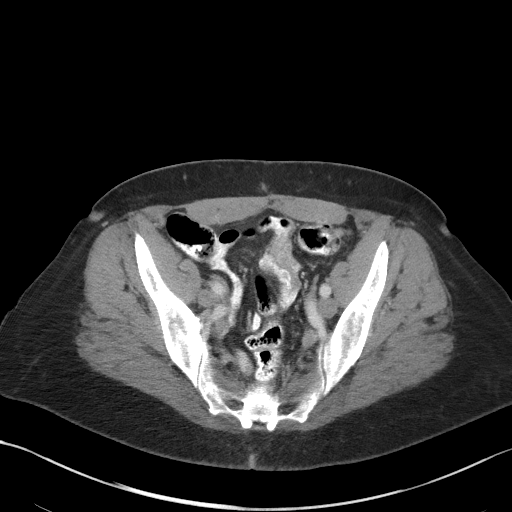
[im 37/92  soft-tissue]
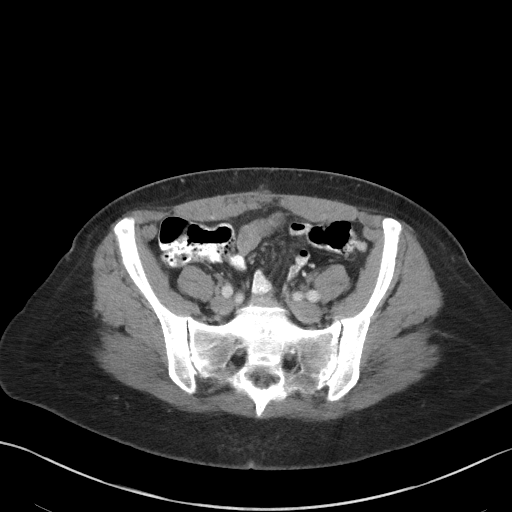
[im 41/92  soft-tissue]
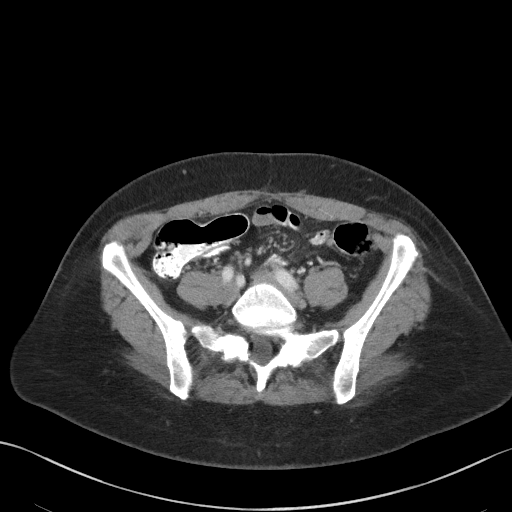
[im 51/92  soft-tissue]
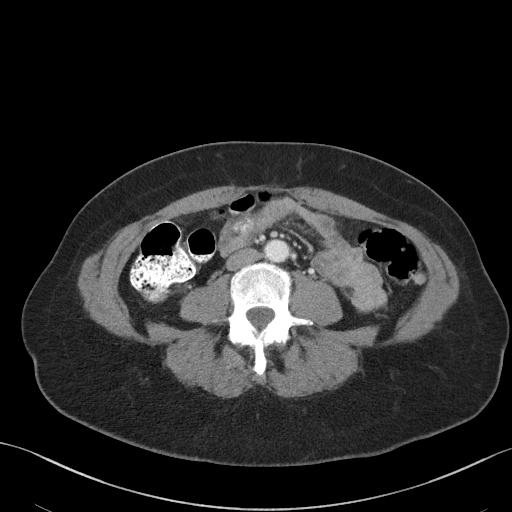
[im 55/92  soft-tissue]
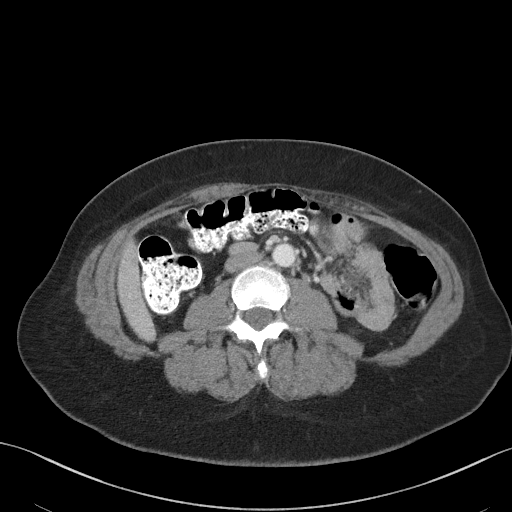
[im 55/92  bone]
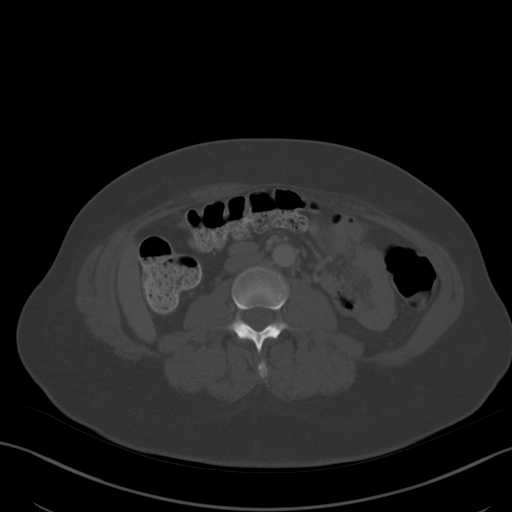
[im 60/92  soft-tissue]
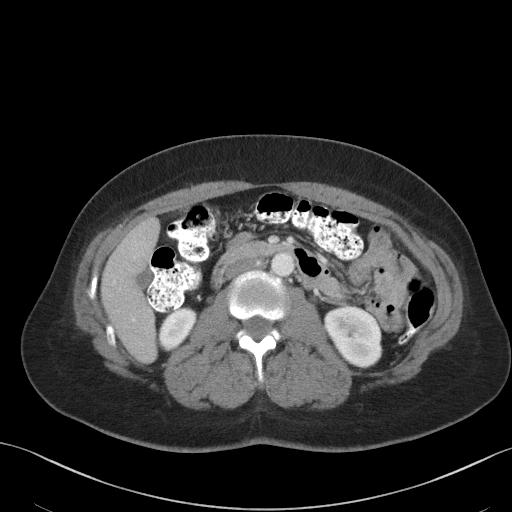
[im 69/92  soft-tissue]
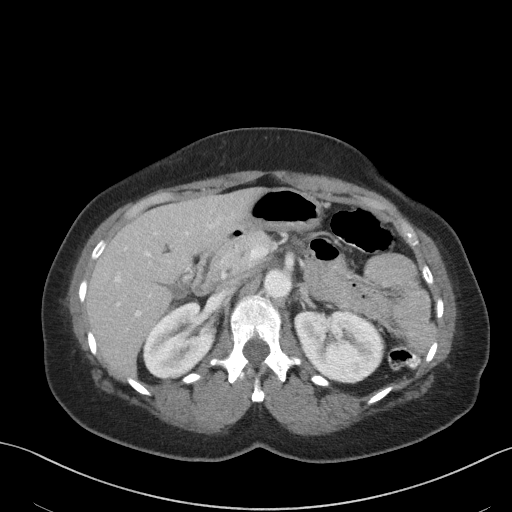
[im 73/92  soft-tissue]
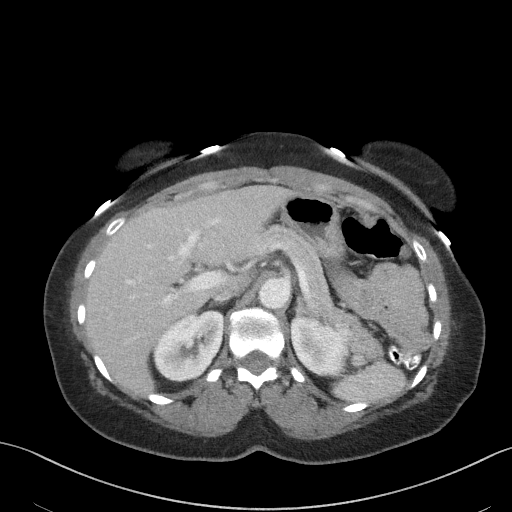
[im 78/92  soft-tissue]
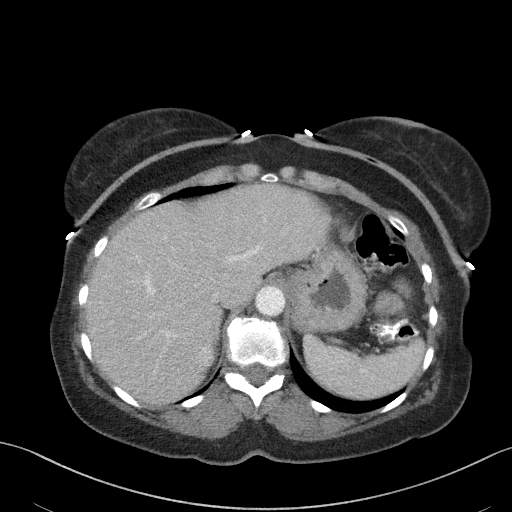
[im 87/92  soft-tissue]
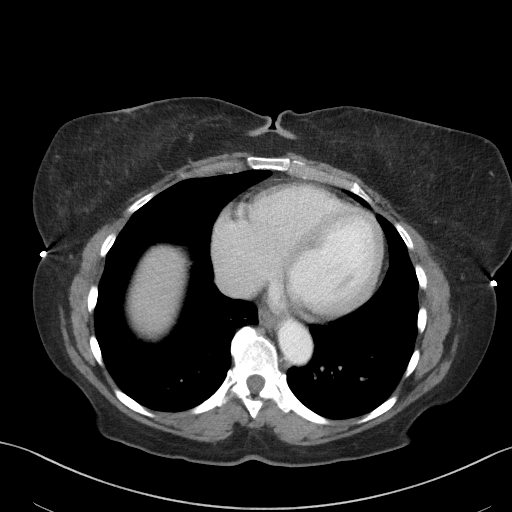

[Series 5: coronal st · coronal · 0.84mm/px · 3 of 79 slices shown]
[im 27/79  soft-tissue]
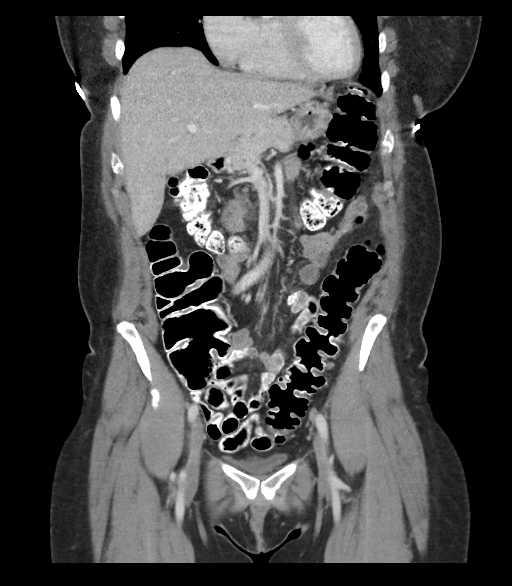
[im 35/79  soft-tissue]
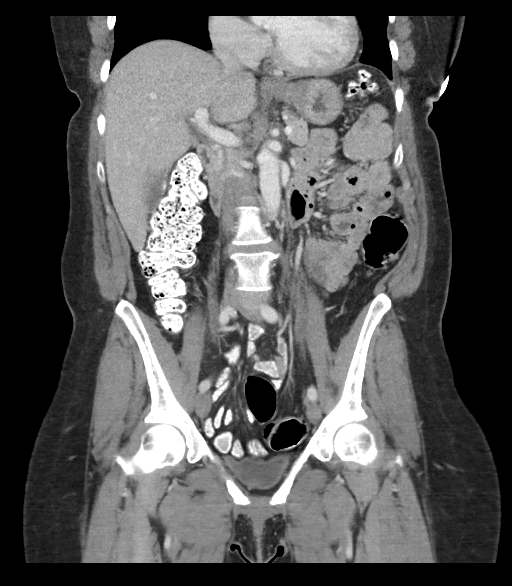
[im 44/79  soft-tissue]
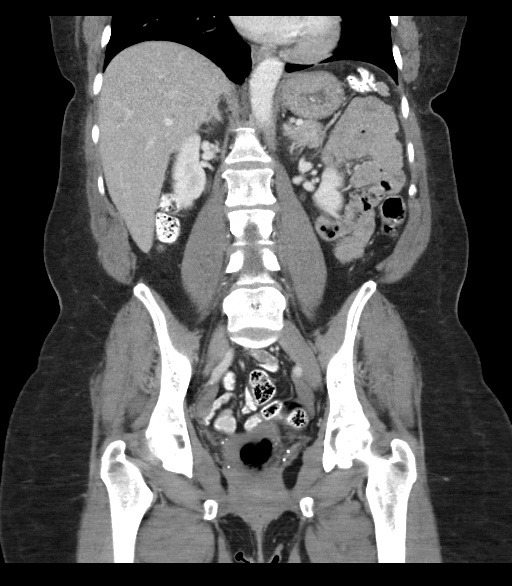

[17 of 46 positions shown; findings below may reference images not displayed]

FINDINGS: Lower chest: No acute abnormality.

Hepatobiliary: No focal liver abnormality is seen. No gallstones,
gallbladder wall thickening, or biliary dilatation.

Pancreas: Unremarkable. No pancreatic ductal dilatation or
surrounding inflammatory changes.

Spleen: Normal in size without focal abnormality.

Adrenals/Urinary Tract: Adrenal glands are unremarkable. Kidneys are
normal, without renal calculi, focal lesion, or hydronephrosis.
Bladder is unremarkable.

Stomach/Bowel: Stomach is within normal limits. The appendix is not
seen but no inflammatory changes noted around cecum. No evidence of
bowel wall thickening, distention, or inflammatory changes.

Vascular/Lymphatic: No significant vascular findings are present. No
enlarged abdominal or pelvic lymph nodes.

Reproductive: Patient status post prior hysterectomy. The ovaries
are normal.

Other: None.

Musculoskeletal: Degenerative joint changes of the spine are noted.
IMPRESSION: No acute abnormality identified in the abdomen and pelvis.

## 2018-08-15 ENCOUNTER — Other Ambulatory Visit: Payer: Self-pay | Admitting: Gynecology

## 2018-08-15 DIAGNOSIS — Z1231 Encounter for screening mammogram for malignant neoplasm of breast: Secondary | ICD-10-CM

## 2018-09-18 ENCOUNTER — Encounter: Payer: Self-pay | Admitting: Gynecology

## 2018-09-18 ENCOUNTER — Ambulatory Visit (INDEPENDENT_AMBULATORY_CARE_PROVIDER_SITE_OTHER): Payer: BLUE CROSS/BLUE SHIELD | Admitting: Gynecology

## 2018-09-18 VITALS — BP 124/76 | Ht 65.0 in | Wt 194.0 lb

## 2018-09-18 DIAGNOSIS — Z01419 Encounter for gynecological examination (general) (routine) without abnormal findings: Secondary | ICD-10-CM

## 2018-09-18 DIAGNOSIS — N3281 Overactive bladder: Secondary | ICD-10-CM

## 2018-09-18 DIAGNOSIS — N952 Postmenopausal atrophic vaginitis: Secondary | ICD-10-CM | POA: Diagnosis not present

## 2018-09-18 NOTE — Progress Notes (Signed)
    [name] 03/02/1961 [id number]        58 y.o.  G1P1001 for annual gynecologic exam.  Also notes issues with overactive bladder symptoms.  Will feel when she has to go she has to run to the bathroom or she will become incontinent.  No dysuria, frequency, low back pain, fever or chills.  No vaginal symptoms such as discharge irritation itching or odor.  Past medical history,surgical history, problem list, medications, allergies, family history and social history were all reviewed and documented as reviewed in the EPIC chart.  ROS:  Performed with pertinent positives and negatives included in the history, assessment and plan.   Additional significant findings : None   Exam: [name] assistant Vitals:   09/18/18 1028  BP: 124/76  Weight: 194 lb (88 kg)  Height: 5\' 5"  (1.651 m)   Body mass index is 32.28 kg/m.  General appearance:  Normal affect, orientation and appearance. Skin: Grossly normal HEENT: Without gross lesions.  No cervical or supraclavicular adenopathy. Thyroid normal.  Lungs:  Clear without wheezing, rales or rhonchi Cardiac: RR, without RMG Abdominal:  Soft, nontender, without masses, guarding, rebound, organomegaly or hernia Breasts:  Examined lying and sitting without masses, retractions, discharge or axillary adenopathy. Pelvic:  Ext, BUS, Vagina: With atrophic changes  Adnexa: Without masses or tenderness    Anus and perineum: Normal   Rectovaginal: Normal sphincter tone without palpated masses or tenderness.    Assessment/Plan:  58 y.o. G81P1001 female for annual gynecologic exam.   1. Postmenopausal.  No significant menopausal symptoms.  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 2. Overactive bladder symptoms.  Check urine analysis to rule out infection.  Options for management reviewed to include behavior modification as well as medications.  Patient will follow-up if continues to be an issue and I will refer to urology. 3. Colonoscopy  2012.  Repeat at their recommended interval. 4. Mammography scheduled and she will follow-up for this.  Breast exam normal today. 5. DEXA 2014 normal.  Plan repeat DEXA at age 56. 15. Pap smear 2019.  No Pap smear done today.  No history of significant abnormal Pap smears.  Options to stop screening per current screening guidelines status post hysterectomy reviewed.  Will readdress on annual basis. 7. Health maintenance.  No routine lab work done as patient plans to do this at her primary physician's office.  Follow-up 1 year, sooner as needed.   [name], 10:54 AM 09/18/2018

## 2018-09-18 NOTE — Patient Instructions (Signed)
Try over-the-counter Oxytrol for the overactive bladder.  Also researched the Internet as far as behavior modifications in your diet that may help with the symptoms.  Follow-up in 1 year for annual exam.

## 2018-09-19 LAB — URINALYSIS, COMPLETE W/RFL CULTURE
BILIRUBIN URINE: NEGATIVE
Bacteria, UA: NONE SEEN /HPF
GLUCOSE, UA: NEGATIVE
HGB URINE DIPSTICK: NEGATIVE
HYALINE CAST: NONE SEEN /LPF
Ketones, ur: NEGATIVE
Leukocyte Esterase: NEGATIVE
NITRITES URINE, INITIAL: NEGATIVE
PROTEIN: NEGATIVE
RBC / HPF: NONE SEEN /HPF (ref 0–2)
Specific Gravity, Urine: 1.023 (ref 1.001–1.03)
Squamous Epithelial / LPF: NONE SEEN /HPF (ref <=5)
WBC UA: NONE SEEN /HPF (ref 0–5)

## 2018-09-19 LAB — NO CULTURE INDICATED

## 2018-09-20 ENCOUNTER — Encounter: Payer: BLUE CROSS/BLUE SHIELD | Admitting: Gynecology

## 2018-09-24 ENCOUNTER — Ambulatory Visit
Admission: RE | Admit: 2018-09-24 | Discharge: 2018-09-24 | Disposition: A | Discharge status: Home / Self Care | Payer: BLUE CROSS/BLUE SHIELD | Source: Ambulatory Visit | Attending: Gynecology | Admitting: Gynecology

## 2018-09-24 DIAGNOSIS — Z1231 Encounter for screening mammogram for malignant neoplasm of breast: Secondary | ICD-10-CM

## 2018-10-29 DIAGNOSIS — M7661 Achilles tendinitis, right leg: Secondary | ICD-10-CM | POA: Diagnosis not present

## 2018-10-29 DIAGNOSIS — M722 Plantar fascial fibromatosis: Secondary | ICD-10-CM | POA: Diagnosis not present

## 2018-11-11 DIAGNOSIS — M722 Plantar fascial fibromatosis: Secondary | ICD-10-CM | POA: Diagnosis not present

## 2019-03-20 ENCOUNTER — Other Ambulatory Visit: Payer: Self-pay

## 2019-03-21 ENCOUNTER — Ambulatory Visit (HOSPITAL_BASED_OUTPATIENT_CLINIC_OR_DEPARTMENT_OTHER)
Admission: RE | Admit: 2019-03-21 | Discharge: 2019-03-21 | Disposition: A | Discharge status: Home / Self Care | Payer: BC Managed Care – PPO | Source: Ambulatory Visit | Attending: Family Medicine | Admitting: Family Medicine

## 2019-03-21 ENCOUNTER — Ambulatory Visit (INDEPENDENT_AMBULATORY_CARE_PROVIDER_SITE_OTHER): Payer: BC Managed Care – PPO | Admitting: Family Medicine

## 2019-03-21 ENCOUNTER — Encounter: Payer: Self-pay | Admitting: Family Medicine

## 2019-03-21 VITALS — BP 141/90 | HR 71 | Temp 98.9°F | Resp 18 | Ht 65.0 in | Wt 188.0 lb

## 2019-03-21 DIAGNOSIS — M79672 Pain in left foot: Secondary | ICD-10-CM | POA: Diagnosis not present

## 2019-03-21 DIAGNOSIS — M79662 Pain in left lower leg: Secondary | ICD-10-CM

## 2019-03-21 DIAGNOSIS — M25552 Pain in left hip: Secondary | ICD-10-CM | POA: Diagnosis not present

## 2019-03-21 NOTE — Patient Instructions (Signed)
Keep leg elevated Alternate between ice and heat  Ultrasound today

## 2019-03-23 ENCOUNTER — Other Ambulatory Visit: Payer: Self-pay | Admitting: Family Medicine

## 2019-03-23 DIAGNOSIS — M25552 Pain in left hip: Secondary | ICD-10-CM

## 2019-03-23 NOTE — Assessment & Plan Note (Signed)
Suspect its from her back R/o dvt first -- consider ortho

## 2019-03-23 NOTE — Progress Notes (Signed)
Patient ID: [name], female    DOB: Nov 21, 1960  Age: 58 y.o. MRN: [id number]    Subjective:  Subjective  HPI [name] presents for pain in L hip that goes down L leg and is behind L knee.  Most of the pain is behind L knee.  No known injury, no back pain   Review of Systems  Constitutional: Negative for appetite change, diaphoresis, fatigue and unexpected weight change.  Eyes: Negative for pain, redness and visual disturbance.  Respiratory: Negative for cough, chest tightness, shortness of breath and wheezing.   Cardiovascular: Negative for chest pain, palpitations and leg swelling.  Endocrine: Negative for cold intolerance, heat intolerance, polydipsia, polyphagia and polyuria.  Genitourinary: Negative for difficulty urinating, dysuria and frequency.  Musculoskeletal: Positive for gait problem.  Neurological: Negative for dizziness, light-headedness, numbness and headaches.    History Past Medical History:  Diagnosis Date  . Arthritis    rheumatoid per pt    She has a past surgical history that includes obstructed bile duct (2004); Abdominal hysterectomy (2001); Rotator cuff repair (12/2010); Oophorectomy; and Cataract extraction.   Her family history includes Arthritis in her mother and sister; Heart disease (age of onset: 4) in her mother; Hypertension in her father and mother; Pulmonary embolism in her mother; Stroke in her mother.She reports that she has never smoked. She has never used smokeless tobacco. She reports that she does not drink alcohol or use drugs.  Current Outpatient Medications on File Prior to Visit  Medication Sig Dispense Refill  . Cholecalciferol (VITAMIN D PO) Take by mouth.    . Multiple Vitamin (MULTIVITAMIN) tablet Take 1 tablet by mouth daily.    . Naproxen-Esomeprazole 500-20 MG TBEC Take 1 tablet by mouth 2 (two) times daily. 60 tablet 3   No current facility-administered medications on file prior to visit.      Objective:  Objective   Physical Exam Vitals signs and nursing note reviewed.  Musculoskeletal:        General: Tenderness present. No signs of injury.     Left hip: She exhibits tenderness. She exhibits normal range of motion, normal strength, no bony tenderness, no swelling and no crepitus.     Left knee: Tenderness found.     Right lower leg: No edema.     Left lower leg: No edema.       Legs:  Neurological:     Motor: No weakness.     Deep Tendon Reflexes: Reflexes normal.    BP (!) 141/90 (BP Location: Left Arm, Patient Position: Sitting, Cuff Size: Normal)   Pulse 71   Temp 98.9 F (37.2 C) (Oral)   Resp 18   Ht 5\' 5"  (1.651 m)   Wt 188 lb (85.3 kg)   SpO2 100%   BMI 31.28 kg/m  Wt Readings from Last 3 Encounters:  03/21/19 188 lb (85.3 kg)  09/18/18 194 lb (88 kg)  06/11/18 191 lb (86.6 kg)     Lab Results  Component Value Date   WBC 4.9 04/07/2015   HGB 12.7 04/07/2015   HCT 38.2 04/07/2015   PLT 195.0 04/07/2015   GLUCOSE 85 04/07/2015   CHOL 242 (H) 10/01/2014   TRIG 131.0 10/01/2014   HDL 69.60 10/01/2014   LDLDIRECT 136.6 09/19/2012   LDLCALC 146 (H) 10/01/2014   ALT 13 04/07/2015   AST 18 04/07/2015   NA 139 04/07/2015   K 4.2 04/07/2015   CL 102 04/07/2015   CREATININE 0.68 04/07/2015  BUN 14 04/07/2015   CO2 29 04/07/2015   TSH 0.85 04/07/2015    US Venous Img Lower Unilateral Left  Result Date: 03/22/2019 CLINICAL DATA:  Hip and foot pain x2 months EXAM: LEFT LOWER EXTREMITY VENOUS DOPPLER ULTRASOUND TECHNIQUE: Gray-scale sonography with compression, as well as color and duplex ultrasound, were performed to evaluate the deep venous system from the level of the common femoral vein through the popliteal and proximal calf veins. COMPARISON:  06/01/2015 FINDINGS: Normal compressibility of the common femoral, superficial femoral, and popliteal veins, as well as the proximal calf veins. No filling defects to suggest DVT on grayscale or color Doppler imaging. Doppler  waveforms show normal direction of venous flow, normal respiratory phasicity and response to augmentation. Visualized segments of the saphenous venous system normal in caliber and compressibility. Survey views of the contralateral common femoral vein are unremarkable. IMPRESSION: No femoropopliteal and no calf DVT in the visualized calf veins. If clinical symptoms are inconsistent or if there are persistent or worsening symptoms, further imaging (possibly involving the iliac veins) may be warranted. Electronically Signed   By: Lucrezia Europe M.D.   On: 03/22/2019 09:45     Assessment & Plan:  Plan  I am having [name] maintain her multivitamin, Cholecalciferol (VITAMIN D PO), and Naproxen-Esomeprazole.  No orders of the defined types were placed in this encounter.   Problem List Items Addressed This Visit      Unprioritized   Left hip pain    Suspect its from her back R/o dvt first -- consider ortho        Other Visit Diagnoses    Pain of left calf    -  Primary   Relevant Orders   US Venous Img Lower Unilateral Left (Completed)    ice/ heat Tylenol Elevate Pt also in L hip    Follow-up: No follow-ups on file.  Ann Held, DO

## 2019-03-31 ENCOUNTER — Encounter: Payer: Self-pay | Admitting: Physician Assistant

## 2019-03-31 ENCOUNTER — Ambulatory Visit: Payer: Self-pay

## 2019-03-31 ENCOUNTER — Ambulatory Visit (INDEPENDENT_AMBULATORY_CARE_PROVIDER_SITE_OTHER): Payer: BC Managed Care – PPO | Admitting: Physician Assistant

## 2019-03-31 ENCOUNTER — Other Ambulatory Visit: Payer: Self-pay

## 2019-03-31 DIAGNOSIS — M25552 Pain in left hip: Secondary | ICD-10-CM

## 2019-03-31 MED ORDER — METHYLPREDNISOLONE ACETATE 40 MG/ML IJ SUSP
40.0000 mg | INTRAMUSCULAR | Status: AC | PRN
  Administered 2019-03-31: 40 mg via INTRA_ARTICULAR

## 2019-03-31 MED ORDER — LIDOCAINE HCL 1 % IJ SOLN
3.0000 mL | INTRAMUSCULAR | Status: AC | PRN
  Administered 2019-03-31: 09:00:00 3 mL

## 2019-03-31 NOTE — Progress Notes (Signed)
Office Visit Note   Patient: [name]           Date of Birth: [birth year]           MRN: [id number] Visit Date: 03/31/2019              Requested by: [address] [place], [place] [address] [place],  [place] [address] PCP: Carollee Herter, Alferd Apa, DO   Assessment & Plan: Visit Diagnoses:  1. Pain of left hip joint     Plan: She is shown IT band stretching exercises.  She will perform these on her own.  We will see her back in 2 weeks to check her response to the injection.  I did discuss with her she still may have a back issue but I do not feel that this is pain is coming from her hip joint due to the fact she has excellent range of motion of the hip and mild arthritic changes on radiographs.  Questions were encouraged and answered at length.  Follow-Up Instructions: Return in about 2 weeks (around 04/14/2019).   Orders:  Orders Placed This Encounter  Procedures  . Large Joint Inj: R greater trochanter  . XR HIP UNILAT W OR W/O PELVIS 2-3 VIEWS LEFT   No orders of the defined types were placed in this encounter.     Procedures: Large Joint Inj: R greater trochanter on 03/31/2019 8:50 AM Indications: pain Details: 22 G 1.5 in needle, lateral approach  Arthrogram: No  Medications: 3 mL lidocaine 1 %; 40 mg methylPREDNISolone acetate 40 MG/ML Outcome: tolerated well, no immediate complications Procedure, treatment alternatives, risks and benefits explained, specific risks discussed. Consent was given by the patient. Immediately prior to procedure a time out was called to verify the correct patient, procedure, equipment, support staff and site/side marked as required. Patient was prepped and draped in the usual sterile fashion.       Clinical Data: No additional findings.   Subjective: Chief Complaint  Patient presents with  . Left Hip - Pain    HPI [name] comes in today with left hip pain that started about 2 months ago.  She has pain down the  lateral aspect of her left hip.  She denies any back pain.  She states she has times where the pain is unbearable.  Pain is worse with eating in bed and lying on the hip.  She has no problems putting on shoes or socks.  Pain occasionally does radiate down to her foot but she has had a foot problem that she is seeing a podiatrist for unsure of where the pain starts and stops.  She has had no new injury. Review of Systems Please see HPI otherwise negative or noncontributory.  Objective: Vital Signs: There were no vitals taken for this visit.  Physical Exam General: Well-developed well-nourished female no acute distress mood and affect appropriate. Psych: Alert and oriented x3. Vascular: Dorsal pedal pulses are 2+ equal and symmetric.  Calf supple nontender. Ortho Exam Bilateral hips excellent range of motion pain with external rotation left hip.  Tenderness over left trochanteric region.  5-5 strength throughout the lower extremities against resistance.  Deep tendon reflexes are 2+ at the knees and ankles and equal and symmetric.  Sensation grossly intact bilateral feet to light touch. Specialty Comments:  No specialty comments available.  Imaging: Xr Hip Unilat W Or W/o Pelvis 2-3 Views Left  Result Date: 03/31/2019 AP pelvis lateral view  of the left hip: No acute fractures.  Hip joint overall well-maintained.  Some mild arthritic changes with periarticular osteophytes.  Otherwise no bony abnormalities.    PMFS History: Patient Active Problem List   Diagnosis Date Noted  . Lumbar radiculopathy 05/29/2017  . Greater trochanteric bursitis of left hip 05/29/2017  . Contusion, hip and thigh, left, initial encounter 05/29/2017  . Left hip pain 04/30/2017  . Abdominal pain, LLQ 04/30/2017  . Back pain 02/16/2015  . Obesity (BMI 30-39.9) 06/09/2013  . Osteoarthritis   . Preventative health care 03/25/2011  . OSTEOARTHRITIS 12/15/2009  . 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 SBO 07/01/2007  . HERNIA, UMBILICAL 02/77/4128   Past Medical History:  Diagnosis Date  . Arthritis    rheumatoid per pt    Family History  Problem Relation Age of Onset  . Heart disease Mother 16       MI  . Stroke Mother   . Hypertension Mother   . Arthritis Mother   . Pulmonary embolism Mother   . Hypertension Father   . Arthritis Sister   . Colon cancer Neg Hx   . Colon polyps Neg Hx     Past Surgical History:  Procedure Laterality Date  . ABDOMINAL HYSTERECTOMY  2001   TAH/BSO fibroids  . CATARACT EXTRACTION    . obstructed bile duct  2004   Dr Sharmaine Base  . OOPHORECTOMY     BSO  . ROTATOR CUFF REPAIR  12/2010   Right   Social History   Occupational History  . Occupation: MEAT CUTTER    Employer: [employer]  Tobacco Use  . Smoking status: Never Smoker  . Smokeless tobacco: Never Used  Substance and Sexual Activity  . Alcohol use: No    Alcohol/week: 0.0 standard drinks  . Drug use: No  . Sexual activity: Not Currently    Partners: Male    Birth control/protection: Surgical    Comment: 1st intercourse 58 yo-More than 5 partners

## 2019-04-09 ENCOUNTER — Telehealth: Payer: Self-pay | Admitting: *Deleted

## 2019-04-09 NOTE — Telephone Encounter (Signed)
Patient called and left message asking for nurse to call her back, I called and received her voicemail. I asked her to call me.

## 2019-04-10 NOTE — Telephone Encounter (Signed)
Per note 09/18/18 "Overactive bladder symptoms.  Check urine analysis to rule out infection.  Options for management reviewed to include behavior modification as well as medications.  Patient will follow-up if continues to be an issue and I will refer to urology"  Patient would like referral office notes faxed to [facility] they will call to schedule, patient informed.

## 2019-04-14 ENCOUNTER — Encounter: Payer: Self-pay | Admitting: Physician Assistant

## 2019-04-14 ENCOUNTER — Ambulatory Visit (INDEPENDENT_AMBULATORY_CARE_PROVIDER_SITE_OTHER): Payer: BC Managed Care – PPO | Admitting: Physician Assistant

## 2019-04-14 VITALS — Ht 65.0 in | Wt 188.0 lb

## 2019-04-14 DIAGNOSIS — M25552 Pain in left hip: Secondary | ICD-10-CM

## 2019-04-14 MED ORDER — METHYLPREDNISOLONE 4 MG PO TABS: ORAL_TABLET | ORAL | 0 refills | Status: DC

## 2019-04-14 NOTE — Telephone Encounter (Signed)
Patient scheduled on 05/02/19 with Dr. Gloriann Loan.

## 2019-04-14 NOTE — Progress Notes (Addendum)
Office Visit Note   Patient: [name]           Date of Birth: 26-Mar-1961           MRN: [id number] Visit Date: 04/14/2019              Requested by: [address], [place], [place] [address] [place],  [place] [address] PCP: Carollee Herter, Alferd Apa, DO   Assessment & Plan: Visit Diagnoses:  1. Pain of left hip joint     Plan: We will send her to formal physical therapy for IT band stretching, modalities, and a home exercise program.  We will place her on a Medrol Dosepak no NSAIDs while on Medrol Dosepak  discussed with patient at length.  She is not trending towards improvement in 3 weeks she will call the office and we can obtain an MRI of her hip.  Questions were encouraged and answered at length.  Follow-Up Instructions: Return if symptoms worsen or fail to improve.   Orders:  No orders of the defined types were placed in this encounter.  Meds ordered this encounter  Medications   methylPREDNISolone (MEDROL) 4 MG tablet    Sig: Take as directed    Dispense:  21 tablet    Refill:  0      Procedures: No procedures performed   Clinical Data: No additional findings.   Subjective: Chief Complaint  Patient presents with   Left Hip - Follow-up    S/p left hip injection 03/31/19    HPI Continued  Left hip pain.  Left hip trochanteric injection did not help.   Review of Systems No fevers , chills or shortness of breath  Objective: Vital Signs: Ht 5\' 5"  (1.651 m)    Wt 188 lb (85.3 kg)    BMI 31.28 kg/m   Physical Exam Constitutional:      Appearance: She is not ill-appearing or diaphoretic.  Pulmonary:     Effort: Pulmonary effort is normal.  Neurological:     Mental Status: She is alert and oriented to person, place, and time.     Ortho Exam Lower extremity strength testing 5 out of 5 strength throughout against resistance.  Negative straight leg raise bilaterally.  Nontender over the lower lumbar spinal column and paraspinous region.   Tenderness bilateral lateral hips left greater than right over the trochanteric region.  Left hip external rotation causes pain lateral aspect of the hip. Specialty Comments:  No specialty comments available.  Imaging: No results found.   PMFS History: Patient Active Problem List   Diagnosis Date Noted   Lumbar radiculopathy 05/29/2017   Greater trochanteric bursitis of left hip 05/29/2017   Contusion, hip and thigh, left, initial encounter 05/29/2017   Left hip pain 04/30/2017   Abdominal pain, LLQ 04/30/2017   Back pain 02/16/2015   Obesity (BMI 30-39.9) 06/09/2013   Osteoarthritis    Preventative health care 03/25/2011   OSTEOARTHRITIS 12/15/2009   IMPAIRMENT, ONE EYE MODERATE, OTHER NOS 07/01/2007   SBO 44/81/8563   HERNIA, UMBILICAL 14/97/0263   Past Medical History:  Diagnosis Date   Arthritis    rheumatoid per pt    Family History  Problem Relation Age of Onset   Heart disease Mother 47       MI   Stroke Mother    Hypertension Mother    Arthritis Mother    Pulmonary embolism Mother    Hypertension Father    Arthritis Sister  Colon cancer Neg Hx    Colon polyps Neg Hx     Past Surgical History:  Procedure Laterality Date   ABDOMINAL HYSTERECTOMY  2001   TAH/BSO fibroids   CATARACT EXTRACTION     obstructed bile duct  2004   Dr Sharmaine Base   OOPHORECTOMY     BSO   ROTATOR CUFF REPAIR  12/2010   Right   Social History   Occupational History   Occupation: MEAT CUTTER    Employer: [employer]  Tobacco Use   Smoking status: Never Smoker   Smokeless tobacco: Never Used  Substance and Sexual Activity   Alcohol use: No    Alcohol/week: 0.0 standard drinks   Drug use: No   Sexual activity: Not Currently    Partners: Male    Birth control/protection: Surgical    Comment: 1st intercourse 58 yo-More than 5 partners

## 2019-04-18 ENCOUNTER — Telehealth: Payer: Self-pay | Admitting: Physician Assistant

## 2019-04-18 NOTE — Telephone Encounter (Signed)
Patient called stating that the medication Artis Delay prescribed is not working.  She is in a lot of pain and wanted to know if there was anything else that could be prescribed to her or she wanted to know if she can be squeezed in before her next appointment.  CB#[phone].  Thank you.

## 2019-04-18 NOTE — Telephone Encounter (Signed)
Please advise. Thanks.  

## 2019-04-21 NOTE — Telephone Encounter (Signed)
Please work her in . Thought we would get a MRI of her hip but would be nice to exam her one more time to make sure we image the correct region. Try flexeril she is already on a NSAID.

## 2019-04-21 NOTE — Telephone Encounter (Signed)
Can we get her in sometime this week please with Artis Delay or Ninfa Linden

## 2019-04-25 ENCOUNTER — Telehealth: Payer: Self-pay | Admitting: Orthopaedic Surgery

## 2019-04-25 ENCOUNTER — Other Ambulatory Visit: Payer: Self-pay | Admitting: Physician Assistant

## 2019-04-25 MED ORDER — TRAMADOL HCL 50 MG PO TABS: 50.0000 mg | ORAL_TABLET | Freq: Four times a day (QID) | ORAL | 0 refills | Status: DC | PRN

## 2019-04-25 NOTE — Telephone Encounter (Signed)
Sent in tramadol

## 2019-04-25 NOTE — Telephone Encounter (Signed)
Pt called in requesting a different pain medication said the prednisone she was taking did not help.   [phone]

## 2019-04-25 NOTE — Telephone Encounter (Signed)
Please advise 

## 2019-05-02 DIAGNOSIS — N3281 Overactive bladder: Secondary | ICD-10-CM | POA: Diagnosis not present

## 2019-05-02 DIAGNOSIS — N3941 Urge incontinence: Secondary | ICD-10-CM | POA: Diagnosis not present

## 2019-05-05 ENCOUNTER — Ambulatory Visit (INDEPENDENT_AMBULATORY_CARE_PROVIDER_SITE_OTHER): Payer: BC Managed Care – PPO | Admitting: Physician Assistant

## 2019-05-05 ENCOUNTER — Encounter: Payer: Self-pay | Admitting: Physician Assistant

## 2019-05-05 ENCOUNTER — Ambulatory Visit: Payer: Self-pay

## 2019-05-05 VITALS — Ht 65.0 in | Wt 180.0 lb

## 2019-05-05 DIAGNOSIS — M5442 Lumbago with sciatica, left side: Secondary | ICD-10-CM

## 2019-05-05 DIAGNOSIS — G8929 Other chronic pain: Secondary | ICD-10-CM | POA: Diagnosis not present

## 2019-05-05 MED ORDER — CYCLOBENZAPRINE HCL 10 MG PO TABS: 10.0000 mg | ORAL_TABLET | Freq: Three times a day (TID) | ORAL | 1 refills | Status: DC | PRN

## 2019-05-05 MED ORDER — DIAZEPAM 5 MG PO TABS: ORAL_TABLET | ORAL | 0 refills | Status: DC

## 2019-05-05 NOTE — Addendum Note (Signed)
Addended by: Meyer Cory on: 05/05/2019 09:37 AM   Modules accepted: Orders

## 2019-05-05 NOTE — Progress Notes (Signed)
Office Visit Note   Patient: [name]           Date of Birth: 1960/12/10           MRN: [id number] Visit Date: 05/05/2019              Requested by: [address], [place], [place] [address] [place],  [place] [address] PCP: Carollee Herter, Alferd Apa, DO   Assessment & Plan: Visit Diagnoses:  1. Chronic left-sided low back pain with left-sided sciatica     Plan: I explained to [name] that I do feel she has trochanteric bursitis but however I feel like she is mostly driving her pain at this point time is coming from her back and his radicular pain from the lumbar spine.  Recommend we obtain an MRI of her lumbar spine rule out HNP as a source of the left leg pain.  Also recommended she go to physical therapy.  We placed her on Flexeril which she is not to take and drive or do any activities she needs to be alert.  Recommend use moist heat low back.  She is claustrophobic therefore we gave her some Valium to hopefully help her get through the MRI.  She will follow-up with [place] after the MRI of the lumbar spine to discuss the results and recommend further treatment.  Questions were encouraged and answered at length.  Follow-Up Instructions: Return for after MRI.   Orders:  Orders Placed This Encounter  Procedures  . XR Lumbar Spine 2-3 Views   Meds ordered this encounter  Medications  . cyclobenzaprine (FLEXERIL) 10 MG tablet    Sig: Take 1 tablet (10 mg total) by mouth 3 (three) times daily as needed for muscle spasms.    Dispense:  40 tablet    Refill:  1  . diazepam (VALIUM) 5 MG tablet    Sig: TAKE ONE TAB ONE HOUR PRIOR TO MRI REPEAT AS NEEDED #2 . ZERO REFILLS    Dispense:  2 tablet    Refill:  0      Procedures: No procedures performed   Clinical Data: No additional findings.   Subjective: Chief Complaint  Patient presents with  . Left Hip - Follow-up    HPI [name] returns today due to left hip pain.  States that the pain lateral aspect of her  hip stops the knee initially when talking with her.  However she continues to have some pain that goes down to the ankle.  She denies any numbness tingling.  States the pain is achy pain to 35 degrees down the lateral aspect of her hip is worse with sitting and worse at night.  She unfortunately did not get a physical therapy.  She feels the Medrol Dosepak gave her no relief whatsoever.  Review of Systems Denies any back pain.  No bowel bladder dysfunction.  Please see HPI otherwise negative treatment.  Objective: Vital Signs: Ht 5\' 5"  (1.651 m)   Wt 180 lb (81.6 kg)   BMI 29.95 kg/m   Physical Exam General: Well-developed well-nourished female no acute distress.  Does however appear uncomfortable and sits with weight shifted onto her right hip.  She is able to get on and off the exam table on her own.  She leads with her left leg getting on the table and is able to pivot with the left hip.  Ortho Exam Lower extremities 5 out of 5 strength throughout except for left  hip flexion against resistance she has 4-5 strength.  She has positive straight leg raise on the left negative on the right.  Full forward flexion lumbar spine limited extension discomfort.  She has pain lower left lumbar paraspinous region.  Good range of motion bilateral hips external rotation the left hip however causes pain.  She has tenderness over the left trochanteric region. Specialty Comments:  No specialty comments available.  Imaging: Xr Lumbar Spine 2-3 Views  Result Date: 05/05/2019 Lumbar spine AP and lateral views: The space overall well-maintained.  Some mild facet changes lower lumbar region particularly at L4-5.  No spondylolisthesis.  No acute fractures.    PMFS History: Patient Active Problem List   Diagnosis Date Noted  . Lumbar radiculopathy 05/29/2017  . Greater trochanteric bursitis of left hip 05/29/2017  . Contusion, hip and thigh, left, initial encounter 05/29/2017  . Left hip pain 04/30/2017  .  Abdominal pain, LLQ 04/30/2017  . Back pain 02/16/2015  . Obesity (BMI 30-39.9) 06/09/2013  . Osteoarthritis   . Preventative health care 03/25/2011  . OSTEOARTHRITIS 12/15/2009  . 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 SBO 07/01/2007  . HERNIA, UMBILICAL [id number]   Past Medical History:  Diagnosis Date  . Arthritis    rheumatoid per pt    Family History  Problem Relation Age of Onset  . Heart disease Mother 49       MI  . Stroke Mother   . Hypertension Mother   . Arthritis Mother   . Pulmonary embolism Mother   . Hypertension Father   . Arthritis Sister   . Colon cancer Neg Hx   . Colon polyps Neg Hx     Past Surgical History:  Procedure Laterality Date  . ABDOMINAL HYSTERECTOMY  2001   TAH/BSO fibroids  . CATARACT EXTRACTION    . obstructed bile duct  2004   Dr Sharmaine Base  . OOPHORECTOMY     BSO  . ROTATOR CUFF REPAIR  12/2010   Right   Social History   Occupational History  . Occupation: MEAT CUTTER    Employer: [employer]  Tobacco Use  . Smoking status: Never Smoker  . Smokeless tobacco: Never Used  Substance and Sexual Activity  . Alcohol use: No    Alcohol/week: 0.0 standard drinks  . Drug use: No  . Sexual activity: Not Currently    Partners: Male    Birth control/protection: Surgical    Comment: 1st intercourse 58 yo-More than 5 partners

## 2019-05-29 DIAGNOSIS — M545 Low back pain: Secondary | ICD-10-CM | POA: Diagnosis not present

## 2019-05-29 DIAGNOSIS — M6281 Muscle weakness (generalized): Secondary | ICD-10-CM | POA: Diagnosis not present

## 2019-05-29 DIAGNOSIS — R262 Difficulty in walking, not elsewhere classified: Secondary | ICD-10-CM | POA: Diagnosis not present

## 2019-05-29 DIAGNOSIS — M79605 Pain in left leg: Secondary | ICD-10-CM | POA: Diagnosis not present

## 2019-06-03 ENCOUNTER — Encounter: Payer: Self-pay | Admitting: Gynecology

## 2019-06-04 ENCOUNTER — Telehealth: Payer: Self-pay | Admitting: Physician Assistant

## 2019-06-04 ENCOUNTER — Other Ambulatory Visit: Payer: Self-pay

## 2019-06-04 DIAGNOSIS — M545 Low back pain: Secondary | ICD-10-CM | POA: Diagnosis not present

## 2019-06-04 DIAGNOSIS — M6281 Muscle weakness (generalized): Secondary | ICD-10-CM | POA: Diagnosis not present

## 2019-06-04 DIAGNOSIS — R262 Difficulty in walking, not elsewhere classified: Secondary | ICD-10-CM | POA: Diagnosis not present

## 2019-06-04 DIAGNOSIS — M79605 Pain in left leg: Secondary | ICD-10-CM | POA: Diagnosis not present

## 2019-06-04 MED ORDER — DIAZEPAM 5 MG PO TABS: ORAL_TABLET | ORAL | 0 refills | Status: DC

## 2019-06-04 NOTE — Telephone Encounter (Signed)
Patient aware this was called in for her  

## 2019-06-04 NOTE — Telephone Encounter (Signed)
Patient called. Says she has a MRI scheduled for tomorrow. She would like something called in for her to take before having the MRI. Her call back number is [phone]

## 2019-06-04 NOTE — Telephone Encounter (Signed)
Ok valium

## 2019-06-06 ENCOUNTER — Ambulatory Visit
Admission: RE | Admit: 2019-06-06 | Discharge: 2019-06-06 | Disposition: A | Discharge status: Home / Self Care | Payer: BC Managed Care – PPO | Source: Ambulatory Visit | Attending: Physician Assistant | Admitting: Physician Assistant

## 2019-06-06 ENCOUNTER — Other Ambulatory Visit: Payer: Self-pay

## 2019-06-06 DIAGNOSIS — G8929 Other chronic pain: Secondary | ICD-10-CM

## 2019-06-06 DIAGNOSIS — M5126 Other intervertebral disc displacement, lumbar region: Secondary | ICD-10-CM | POA: Diagnosis not present

## 2019-06-11 ENCOUNTER — Encounter: Payer: Self-pay | Admitting: Orthopaedic Surgery

## 2019-06-11 ENCOUNTER — Other Ambulatory Visit: Payer: Self-pay

## 2019-06-11 ENCOUNTER — Ambulatory Visit (INDEPENDENT_AMBULATORY_CARE_PROVIDER_SITE_OTHER): Payer: BC Managed Care – PPO | Admitting: Orthopaedic Surgery

## 2019-06-11 DIAGNOSIS — G8929 Other chronic pain: Secondary | ICD-10-CM

## 2019-06-11 DIAGNOSIS — M5442 Lumbago with sciatica, left side: Secondary | ICD-10-CM

## 2019-06-11 DIAGNOSIS — M5416 Radiculopathy, lumbar region: Secondary | ICD-10-CM | POA: Diagnosis not present

## 2019-06-11 NOTE — Progress Notes (Signed)
The patient comes in to go over an MRI of her lumbar spine.  She still has significant left-sided sciatica with pain that goes all the way down to her foot.  She also has significant trochanteric bursitis and I have assessed her left hip which showed no significant arthritis at all on exam or on x-ray.  The pain she is describing with going other than her foot warranted a sending her for an MRI.  She is been on anti-inflammatories and is been going to physical therapy as well.  She says is become exhausting the amount of pain she has especially in the evening.  She still denies any change in bowel or bladder function.  On exam she is very tender to palpation of the trochanteric area of her left hip and down the IT band but she also hurts in sciatic region.  She has a lot of pain in her lower back to the left side.  The MRI of her lumbar spine showed bilateral facet arthritis at L5-S1 and some mild degenerative disc disease at several levels with disc bulging but no nerve compromise or nerve compression and no significant stenosis.  Given the severity of her pain I would still like to send her to Dr. Ernestina Patches to consider a left-sided L5-S1 facet injection versus an epidural steroid in that area to see how this will help with her symptoms.  Should continue therapy and we can have Dr. Ernestina Patches send her back to [place] a few weeks after his injection.  All question concerns were answered addressed.  She agrees with this referral to Dr. Ernestina Patches as well.

## 2019-06-13 DIAGNOSIS — M6281 Muscle weakness (generalized): Secondary | ICD-10-CM | POA: Diagnosis not present

## 2019-06-13 DIAGNOSIS — M545 Low back pain: Secondary | ICD-10-CM | POA: Diagnosis not present

## 2019-06-13 DIAGNOSIS — M79605 Pain in left leg: Secondary | ICD-10-CM | POA: Diagnosis not present

## 2019-06-13 DIAGNOSIS — R262 Difficulty in walking, not elsewhere classified: Secondary | ICD-10-CM | POA: Diagnosis not present

## 2019-07-07 ENCOUNTER — Ambulatory Visit: Payer: BC Managed Care – PPO | Admitting: Physical Medicine and Rehabilitation

## 2019-07-07 ENCOUNTER — Ambulatory Visit: Payer: BC Managed Care – PPO

## 2019-07-07 ENCOUNTER — Other Ambulatory Visit: Payer: Self-pay

## 2019-07-07 ENCOUNTER — Encounter: Payer: Self-pay | Admitting: Physical Medicine and Rehabilitation

## 2019-07-07 VITALS — BP 140/54 | HR 69

## 2019-07-07 DIAGNOSIS — M47816 Spondylosis without myelopathy or radiculopathy, lumbar region: Secondary | ICD-10-CM

## 2019-07-07 MED ORDER — METHYLPREDNISOLONE ACETATE 80 MG/ML IJ SUSP
80.0000 mg | Freq: Once | INTRAMUSCULAR | Status: AC
  Administered 2019-07-07: 80 mg

## 2019-07-07 NOTE — Progress Notes (Signed)
.  Numeric Pain Rating Scale and Functional Assessment Average Pain 9   In the last MONTH (on 0-10 scale) has pain interfered with the following?  1. General activity like being  able to carry out your everyday physical activities such as walking, climbing stairs, carrying groceries, or moving a chair?  Rating(8)   +Driver, -BT, -Dye Allergies.  

## 2019-07-19 ENCOUNTER — Other Ambulatory Visit: Payer: Self-pay

## 2019-07-19 DIAGNOSIS — Z20822 Contact with and (suspected) exposure to covid-19: Secondary | ICD-10-CM

## 2019-07-19 NOTE — Addendum Note (Signed)
Addended by: Luisa Dago A on: 07/19/2019 03:24 PM   Modules accepted: Orders

## 2019-07-22 ENCOUNTER — Encounter: Payer: Self-pay | Admitting: Physical Medicine and Rehabilitation

## 2019-07-22 ENCOUNTER — Ambulatory Visit (INDEPENDENT_AMBULATORY_CARE_PROVIDER_SITE_OTHER): Payer: BC Managed Care – PPO | Admitting: Physical Medicine and Rehabilitation

## 2019-07-22 ENCOUNTER — Other Ambulatory Visit: Payer: Self-pay

## 2019-07-22 VITALS — BP 118/73 | HR 73

## 2019-07-22 DIAGNOSIS — M545 Low back pain: Secondary | ICD-10-CM

## 2019-07-22 DIAGNOSIS — G8929 Other chronic pain: Secondary | ICD-10-CM | POA: Diagnosis not present

## 2019-07-22 DIAGNOSIS — M47816 Spondylosis without myelopathy or radiculopathy, lumbar region: Secondary | ICD-10-CM

## 2019-07-22 LAB — NOVEL CORONAVIRUS, NAA: SARS-CoV-2, NAA: NOT DETECTED

## 2019-07-22 NOTE — Progress Notes (Signed)
  Numeric Pain Rating Scale and Functional Assessment Average Pain (5)   In the last MONTH (on 0-10 scale) has pain interfered with the following?  1. General activity like being  able to carry out your everyday physical activities such as walking, climbing stairs, carrying groceries, or moving a chair?  Rating(5)   +Driver, -BT, -Dye Allergies.  

## 2019-08-11 DIAGNOSIS — L602 Onychogryphosis: Secondary | ICD-10-CM | POA: Diagnosis not present

## 2019-08-11 DIAGNOSIS — M7732 Calcaneal spur, left foot: Secondary | ICD-10-CM | POA: Diagnosis not present

## 2019-08-11 DIAGNOSIS — B351 Tinea unguium: Secondary | ICD-10-CM | POA: Diagnosis not present

## 2019-08-11 DIAGNOSIS — M722 Plantar fascial fibromatosis: Secondary | ICD-10-CM | POA: Diagnosis not present

## 2019-08-18 DIAGNOSIS — L603 Nail dystrophy: Secondary | ICD-10-CM | POA: Diagnosis not present

## 2019-09-01 DIAGNOSIS — R2689 Other abnormalities of gait and mobility: Secondary | ICD-10-CM | POA: Diagnosis not present

## 2019-09-01 DIAGNOSIS — M722 Plantar fascial fibromatosis: Secondary | ICD-10-CM | POA: Diagnosis not present

## 2019-09-01 DIAGNOSIS — M7732 Calcaneal spur, left foot: Secondary | ICD-10-CM | POA: Diagnosis not present

## 2019-09-01 DIAGNOSIS — L602 Onychogryphosis: Secondary | ICD-10-CM | POA: Diagnosis not present

## 2019-09-17 NOTE — Procedures (Signed)
Lumbar Facet Joint Intra-Articular Injection(s) with Fluoroscopic Guidance  Patient: [name]      Date of Birth: 05/17/1961 MRN: [id number] PCP: Ann Held, DO      Visit Date: 07/07/2019   Universal Protocol:    Date/Time: 07/07/2019  Consent Given By: the patient  Position: PRONE   Additional Comments: Vital signs were monitored before and after the procedure. Patient was prepped and draped in the usual sterile fashion. The correct patient, procedure, and site was verified.   Injection Procedure Details:  Procedure Site One Meds Administered:  Meds ordered this encounter  Medications  . methylPREDNISolone acetate (DEPO-MEDROL) injection 80 mg     Laterality: Left  Location/Site:  L5-S1  Needle size: 22 guage  Needle type: Spinal  Needle Placement: Articular  Findings:  -Comments: Excellent flow of contrast producing a partial arthrogram.  Procedure Details: The fluoroscope beam is vertically oriented in AP, and the inferior recess is visualized beneath the lower pole of the inferior apophyseal process, which represents the target point for needle insertion. When direct visualization is difficult the target point is located at the medial projection of the vertebral pedicle. The region overlying each aforementioned target is locally anesthetized with a 1 to 2 ml. volume of 1% Lidocaine without Epinephrine.   The spinal needle was inserted into each of the above mentioned facet joints using biplanar fluoroscopic guidance. A 0.25 to 0.5 ml. volume of Isovue-250 was injected and a partial facet joint arthrogram was obtained. A single spot film was obtained of the resulting arthrogram.    One to 1.25 ml of the steroid/anesthetic solution was then injected into each of the facet joints noted above.   Additional Comments:  The patient tolerated the procedure well Dressing: 2 x 2 sterile gauze and Band-Aid    Post-procedure details: Patient was observed  during the procedure. Post-procedure instructions were reviewed.  Patient left the clinic in stable condition.

## 2019-09-17 NOTE — Progress Notes (Signed)
[name] - 59 y.o. female MRN [id number]  Date of birth: [birth year]  Office Visit Note: Visit Date: 07/22/2019 PCP: Ann Held, DO Referred by: Ann Held, *  Subjective: Chief Complaint  Patient presents with  . Lower Back - Pain   HPI: [name] is a 59 y.o. female who comes in today Patient returns for evaluation management of low back pain with left radicular type leg pain.  She is followed by Dr. Eduard Roux from an orthopedic standpoint.  She was having hip and leg pain which was felt initially to be bursitis but did not really respond to care for this.  She ultimately went on to have an MRI of the lumbar spine showing really no focal narrowing no focal disc herniation no nerve compression.  She did have arthritis at L5-S1.  We completed facet joint block diagnostically and she reports that she really is in absolutely no pain at this point.  She has some increased pain at night but otherwise during the day she is okay.  She actually is feeling her right side more than the left now as the injection was only performed on the left side.  Review of Systems  All other systems reviewed and are negative.  Otherwise per HPI.  Assessment & Plan: Visit Diagnoses:  1. Spondylosis without myelopathy or radiculopathy, lumbar region   2. Chronic bilateral low back pain without sciatica     Plan: Findings:  Significant relief with left-sided facet joint block at L5-S1.  This did relieve the symptoms that seem to be radicular in nature.  This is likely facet joint syndrome or referral pain is further down in June expect and it does mimic nerve pain.  She is having pain on the right which is more than likely the facet arthropathy as well but as well but it is manageable.  If that were to worsen we could repeat the injection on the right side.  If her symptoms come back bilaterally with axial low back pain would do that as well.  She might ultimately be a candidate for  radiofrequency ablation if there was a long ongoing problem.  She will continue to follow-up with Dr.  Eduard Roux for her orthopedic care.    Meds & Orders: No orders of the defined types were placed in this encounter.  No orders of the defined types were placed in this encounter.   Follow-up: Return for visit to requesting physician as needed.   Procedures: No procedures performed  No notes on file   Clinical History: MRI LUMBAR SPINE WITHOUT CONTRAST  TECHNIQUE: Multiplanar, multisequence MR imaging of the lumbar spine was performed. No intravenous contrast was administered.  COMPARISON:  Multiple exams, including lumbar radiographs from 05/05/2019 and abdominal CT from 05/05/2017.  FINDINGS: Segmentation: The lowest lumbar type non-rib-bearing vertebra is labeled as L5.  Alignment:  No vertebral subluxation is observed.  Vertebrae: Type 1 degenerative endplate findings anteriorly at T11-12.  Conus medullaris and cauda equina: Conus extends to the L1 level. Conus and cauda equina appear normal.  Paraspinal and other soft tissues: Unremarkable  Disc levels:  L1-2: No impingement.  Mild disc bulge.  L2-3: No impingement.  Mild disc bulge.  L3-4: No impingement.  Disc bulge and mild facet arthropathy.  L4-5: No impingement.  Mild disc bulge and mild facet arthropathy.  L5-S1: No impingement.  Bilateral facet arthropathy.  IMPRESSION: 1. Lumbar spondylosis and degenerative disc disease but without significant impingement  observed to explain the patient's left-sided symptoms.   Electronically Signed   By: Van Clines M.D.   On: 06/07/2019 10:03   She reports that she has never smoked. She has never used smokeless tobacco. No results for input(s): HGBA1C, LABURIC in the last 8760 hours.  Objective:  VS:  HT:    WT:   BMI:     BP:118/73  HR:73bpm  TEMP: ( )  RESP:  Physical Exam Constitutional:      General: She is not in acute  distress.    Appearance: Normal appearance. She is not ill-appearing.  HENT:     Head: Normocephalic and atraumatic.     Right Ear: External ear normal.     Left Ear: External ear normal.  Eyes:     Extraocular Movements: Extraocular movements intact.  Cardiovascular:     Rate and Rhythm: Normal rate.     Pulses: Normal pulses.  Musculoskeletal:     Right lower leg: No edema.     Left lower leg: No edema.     Comments: Patient has good distal strength with no pain over the greater trochanters.  No clonus or focal weakness.  Skin:    Findings: No erythema, lesion or rash.  Neurological:     General: No focal deficit present.     Mental Status: She is alert and oriented to person, place, and time.     Sensory: No sensory deficit.     Motor: No weakness or abnormal muscle tone.     Coordination: Coordination normal.  Psychiatric:        Mood and Affect: Mood normal.        Behavior: Behavior normal.     Ortho Exam Imaging: No results found.  Past Medical/Family/Surgical/Social History: Medications & Allergies reviewed per EMR, new medications updated. Patient Active Problem List   Diagnosis Date Noted  . Lumbar radiculopathy 05/29/2017  . Greater trochanteric bursitis of left hip 05/29/2017  . Contusion, hip and thigh, left, initial encounter 05/29/2017  . Left hip pain 04/30/2017  . Abdominal pain, LLQ 04/30/2017  . Back pain 02/16/2015  . Obesity (BMI 30-39.9) 06/09/2013  . Osteoarthritis   . Preventative health care 03/25/2011  . OSTEOARTHRITIS 12/15/2009  . 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 SBO 07/01/2007  . HERNIA, UMBILICAL [id number]   Past Medical History:  Diagnosis Date  . Arthritis    rheumatoid per pt   Family History  Problem Relation Age of Onset  . Heart disease Mother 64       MI  . Stroke Mother   . Hypertension Mother   . Arthritis Mother   . Pulmonary embolism Mother   . Hypertension Father   . Arthritis Sister   .  Colon cancer Neg Hx   . Colon polyps Neg Hx    Past Surgical History:  Procedure Laterality Date  . ABDOMINAL HYSTERECTOMY  2001   TAH/BSO fibroids  . CATARACT EXTRACTION    . obstructed bile duct  2004   Dr Sharmaine Base  . OOPHORECTOMY     BSO  . ROTATOR CUFF REPAIR  12/2010   Right   Social History   Occupational History  . Occupation: MEAT CUTTER    Employer: [employer]  Tobacco Use  . Smoking status: Never Smoker  . Smokeless tobacco: Never Used  Substance and Sexual Activity  . Alcohol use: No    Alcohol/week: 0.0 standard drinks  . Drug use: No  .  Sexual activity: Not Currently    Partners: Male    Birth control/protection: Surgical    Comment: 1st intercourse 59 yo-More than 5 partners

## 2019-09-17 NOTE — Progress Notes (Signed)
[name] - 59 y.o. female MRN [id number]  Date of birth: August 13, 1961  Office Visit Note: Visit Date: 07/07/2019 PCP: Ann Held, DO Referred by: Ann Held, *  Subjective: Chief Complaint  Patient presents with  . Left Leg - Pain   HPI:  [name] is a 59 y.o. female who comes in today At the request of Dr. Eduard Roux for left facet joint block at L5-S1.  Patient has MRI showing facet arthropathy at L5-S1 without any nerve compression.  She is having left-sided low back pain but some referral into the leg.  This could be facet joint syndrome with referral pattern further than you would expect just for facet joint pain.  This injection would be diagnostic and currently therapeutic.  ROS Otherwise per HPI.  Assessment & Plan: Visit Diagnoses:  1. Spondylosis without myelopathy or radiculopathy, lumbar region     Plan: No additional findings.   Meds & Orders:  Meds ordered this encounter  Medications  . methylPREDNISolone acetate (DEPO-MEDROL) injection 80 mg    Orders Placed This Encounter  Procedures  . Facet Injection  . XR C-ARM NO REPORT    Follow-up: Return if symptoms worsen or fail to improve.   Procedures: No procedures performed  Lumbar Facet Joint Intra-Articular Injection(s) with Fluoroscopic Guidance  Patient: [name]      Date of Birth: 1961/01/12 MRN: [id number] PCP: Ann Held, DO      Visit Date: 07/07/2019   Universal Protocol:    Date/Time: 07/07/2019  Consent Given By: the patient  Position: PRONE   Additional Comments: Vital signs were monitored before and after the procedure. Patient was prepped and draped in the usual sterile fashion. The correct patient, procedure, and site was verified.   Injection Procedure Details:  Procedure Site One Meds Administered:  Meds ordered this encounter  Medications  . methylPREDNISolone acetate (DEPO-MEDROL) injection 80 mg     Laterality:  Left  Location/Site:  L5-S1  Needle size: 22 guage  Needle type: Spinal  Needle Placement: Articular  Findings:  -Comments: Excellent flow of contrast producing a partial arthrogram.  Procedure Details: The fluoroscope beam is vertically oriented in AP, and the inferior recess is visualized beneath the lower pole of the inferior apophyseal process, which represents the target point for needle insertion. When direct visualization is difficult the target point is located at the medial projection of the vertebral pedicle. The region overlying each aforementioned target is locally anesthetized with a 1 to 2 ml. volume of 1% Lidocaine without Epinephrine.   The spinal needle was inserted into each of the above mentioned facet joints using biplanar fluoroscopic guidance. A 0.25 to 0.5 ml. volume of Isovue-250 was injected and a partial facet joint arthrogram was obtained. A single spot film was obtained of the resulting arthrogram.    One to 1.25 ml of the steroid/anesthetic solution was then injected into each of the facet joints noted above.   Additional Comments:  The patient tolerated the procedure well Dressing: 2 x 2 sterile gauze and Band-Aid    Post-procedure details: Patient was observed during the procedure. Post-procedure instructions were reviewed.  Patient left the clinic in stable condition.     Clinical History: MRI LUMBAR SPINE WITHOUT CONTRAST  TECHNIQUE: Multiplanar, multisequence MR imaging of the lumbar spine was performed. No intravenous contrast was administered.  COMPARISON:  Multiple exams, including lumbar radiographs from 05/05/2019 and abdominal CT from 05/05/2017.  FINDINGS: Segmentation:  The lowest lumbar type non-rib-bearing vertebra is labeled as L5.  Alignment:  No vertebral subluxation is observed.  Vertebrae: Type 1 degenerative endplate findings anteriorly at T11-12.  Conus medullaris and cauda equina: Conus extends to the L1  level. Conus and cauda equina appear normal.  Paraspinal and other soft tissues: Unremarkable  Disc levels:  L1-2: No impingement.  Mild disc bulge.  L2-3: No impingement.  Mild disc bulge.  L3-4: No impingement.  Disc bulge and mild facet arthropathy.  L4-5: No impingement.  Mild disc bulge and mild facet arthropathy.  L5-S1: No impingement.  Bilateral facet arthropathy.  IMPRESSION: 1. Lumbar spondylosis and degenerative disc disease but without significant impingement observed to explain the patient's left-sided symptoms.   Electronically Signed   By: Van Clines M.D.   On: 06/07/2019 10:03     Objective:  VS:  HT:    WT:   BMI:     BP:(!) 140/54  HR:69bpm  TEMP: ( )  RESP:  Physical Exam  Ortho Exam Imaging: No results found.

## 2019-09-19 ENCOUNTER — Other Ambulatory Visit: Payer: Self-pay

## 2019-09-22 ENCOUNTER — Encounter: Payer: Self-pay | Admitting: Women's Health

## 2019-09-22 ENCOUNTER — Ambulatory Visit (INDEPENDENT_AMBULATORY_CARE_PROVIDER_SITE_OTHER): Payer: BC Managed Care – PPO | Admitting: Women's Health

## 2019-09-22 ENCOUNTER — Other Ambulatory Visit: Payer: Self-pay

## 2019-09-22 VITALS — BP 122/80 | Ht 65.0 in | Wt 177.0 lb

## 2019-09-22 DIAGNOSIS — Z01419 Encounter for gynecological examination (general) (routine) without abnormal findings: Secondary | ICD-10-CM

## 2019-09-22 DIAGNOSIS — Z13820 Encounter for screening for osteoporosis: Secondary | ICD-10-CM

## 2019-09-22 NOTE — Patient Instructions (Addendum)
Vit D 2000 daily Shingles vaccine  Health Maintenance for Postmenopausal Women Menopause is a normal process in which your ability to get pregnant comes to an end. This process happens slowly over many months or years, usually between the ages of 1 and 37. Menopause is complete when you have missed your menstrual periods for 12 months. It is important to talk with your health care provider about some of the most common conditions that affect women after menopause (postmenopausal women). These include heart disease, cancer, and bone loss (osteoporosis). Adopting a healthy lifestyle and getting preventive care can help to promote your health and wellness. The actions you take can also lower your chances of developing some of these common conditions. What should I know about menopause? During menopause, you may get a number of symptoms, such as:  Hot flashes. These can be moderate or severe.  Night sweats.  Decrease in sex drive.  Mood swings.  Headaches.  Tiredness.  Irritability.  Memory problems.  Insomnia. Choosing to treat or not to treat these symptoms is a decision that you make with your health care provider. Do I need hormone replacement therapy?  Hormone replacement therapy is effective in treating symptoms that are caused by menopause, such as hot flashes and night sweats.  Hormone replacement carries certain risks, especially as you become older. If you are thinking about using estrogen or estrogen with progestin, discuss the benefits and risks with your health care provider. What is my risk for heart disease and stroke? The risk of heart disease, heart attack, and stroke increases as you age. One of the causes may be a change in the body's hormones during menopause. This can affect how your body uses dietary fats, triglycerides, and cholesterol. Heart attack and stroke are medical emergencies. There are many things that you can do to help prevent heart disease and  stroke. Watch your blood pressure  High blood pressure causes heart disease and increases the risk of stroke. This is more likely to develop in people who have high blood pressure readings, are of African descent, or are overweight.  Have your blood pressure checked: ? Every 3-5 years if you are 71-72 years of age. ? Every year if you are 39 years old or older. Eat a healthy diet   Eat a diet that includes plenty of vegetables, fruits, low-fat dairy products, and lean protein.  Do not eat a lot of foods that are high in solid fats, added sugars, or sodium. Get regular exercise Get regular exercise. This is one of the most important things you can do for your health. Most adults should:  Try to exercise for at least 150 minutes each week. The exercise should increase your heart rate and make you sweat (moderate-intensity exercise).  Try to do strengthening exercises at least twice each week. Do these in addition to the moderate-intensity exercise.  Spend less time sitting. Even light physical activity can be beneficial. Other tips  Work with your health care provider to achieve or maintain a healthy weight.  Do not use any products that contain nicotine or tobacco, such as cigarettes, e-cigarettes, and chewing tobacco. If you need help quitting, ask your health care provider.  Know your numbers. Ask your health care provider to check your cholesterol and your blood sugar (glucose). Continue to have your blood tested as directed by your health care provider. Do I need screening for cancer? Depending on your health history and family history, you may need to have cancer screening  at different stages of your life. This may include screening for:  Breast cancer.  Cervical cancer.  Lung cancer.  Colorectal cancer. What is my risk for osteoporosis? After menopause, you may be at increased risk for osteoporosis. Osteoporosis is a condition in which bone destruction happens more  quickly than new bone creation. To help prevent osteoporosis or the bone fractures that can happen because of osteoporosis, you may take the following actions:  If you are 30-55 years old, get at least 1,000 mg of calcium and at least 600 mg of vitamin D per day.  If you are older than age 30 but younger than age 73, get at least 1,200 mg of calcium and at least 600 mg of vitamin D per day.  If you are older than age 70, get at least 1,200 mg of calcium and at least 800 mg of vitamin D per day. Smoking and drinking excessive alcohol increase the risk of osteoporosis. Eat foods that are rich in calcium and vitamin D, and do weight-bearing exercises several times each week as directed by your health care provider. How does menopause affect my mental health? Depression may occur at any age, but it is more common as you become older. Common symptoms of depression include:  Low or sad mood.  Changes in sleep patterns.  Changes in appetite or eating patterns.  Feeling an overall lack of motivation or enjoyment of activities that you previously enjoyed.  Frequent crying spells. Talk with your health care provider if you think that you are experiencing depression. General instructions See your health care provider for regular wellness exams and vaccines. This may include:  Scheduling regular health, dental, and eye exams.  Getting and maintaining your vaccines. These include: ? Influenza vaccine. Get this vaccine each year before the flu season begins. ? Pneumonia vaccine. ? Shingles vaccine. ? Tetanus, diphtheria, and pertussis (Tdap) booster vaccine. Your health care provider may also recommend other immunizations. Tell your health care provider if you have ever been abused or do not feel safe at home. Summary  Menopause is a normal process in which your ability to get pregnant comes to an end.  This condition causes hot flashes, night sweats, decreased interest in sex, mood swings,  headaches, or lack of sleep.  Treatment for this condition may include hormone replacement therapy.  Take actions to keep yourself healthy, including exercising regularly, eating a healthy diet, watching your weight, and checking your blood pressure and blood sugar levels.  Get screened for cancer and depression. Make sure that you are up to date with all your vaccines. This information is not intended to replace advice given to you by your health care provider. Make sure you discuss any questions you have with your health care provider. Document Revised: 08/14/2018 Document Reviewed: 08/14/2018 Elsevier Patient Education  2020 [employer].

## 2019-09-22 NOTE — Progress Notes (Signed)
[name] July 06, 1961 [id number]    History:    Presents for annual exam.  2001 TAH with BSO for fibroids on no HRT.  Not sexually active, denies need for STD screen.  Normal Pap and mammogram history.  2014 normal DEXA.  2012 - colonoscopy 10-year follow-up.  Osteoarthritis and plantar fasciitis primary care managing.  Past medical history, past surgical history, family history and social history were all reviewed and documented in the EPIC chart.  Works at [employer] is on her feet most days.  1 daughter and 2 grandchildren all doing well.  Mother deceased from stroke, father alive and well.  She had blood plug  ROS:  A ROS was performed and pertinent positives and negatives are included.  Exam:  Vitals:   09/22/19 1143  BP: 122/80  Weight: 177 lb (80.3 kg)  Height: 5\' 5"  (1.651 m)   Body mass index is 29.45 kg/m.   General appearance:  Normal Thyroid:  Symmetrical, normal in size, without palpable masses or nodularity. Respiratory  Auscultation:  Clear without wheezing or rhonchi Cardiovascular  Auscultation:  Regular rate, without rubs, murmurs or gallops  Edema/varicosities:  Not grossly evident Abdominal  Soft,nontender, without masses, guarding or rebound.  Liver/spleen:  No organomegaly noted  Hernia:  None appreciated  Skin  Inspection:  Grossly normal   Breasts: Examined lying and sitting.     Right: Without masses, retractions, discharge or axillary adenopathy.     Left: Without masses, retractions, discharge or axillary adenopathy. Gentitourinary   Inguinal/mons:  Normal without inguinal adenopathy  External genitalia:  Normal  BUS/Urethra/Skene's glands:  Normal  Vagina:  Normal  Cervix: And uterus absent   Adnexa/parametria:     Rt: Without masses or tenderness.   Lt: Without masses or tenderness.  Anus and perineum: Normal  Digital rectal exam: Normal sphincter tone without palpated masses or tenderness  Assessment/Plan:  59 y.o. DBF G1, P1 for  annual exam with no complaints.  2001 TAH with BSO on no HRT Osteoarthritis and plantar fasciitis primary care managing labs  Plan: SBEs, continue annual screening mammogram, schedule DEXA, aware of importance of weightbearing and balance exercise.  Home safety, fall prevention discussed.  Vitamin D 2000 IUs daily and  calcium rich foods encouraged..  Pap screening guidelines reviewed.  Shingrix vaccine discussed.  Condoms encouraged if becomes sexually active.    [place], 12:26 PM 09/22/2019

## 2019-09-26 ENCOUNTER — Other Ambulatory Visit: Payer: Self-pay | Admitting: Women's Health

## 2019-09-26 DIAGNOSIS — Z1231 Encounter for screening mammogram for malignant neoplasm of breast: Secondary | ICD-10-CM

## 2019-10-10 DIAGNOSIS — H26491 Other secondary cataract, right eye: Secondary | ICD-10-CM | POA: Diagnosis not present

## 2019-10-10 DIAGNOSIS — H53002 Unspecified amblyopia, left eye: Secondary | ICD-10-CM | POA: Diagnosis not present

## 2019-10-10 DIAGNOSIS — H2522 Age-related cataract, morgagnian type, left eye: Secondary | ICD-10-CM | POA: Diagnosis not present

## 2019-10-10 DIAGNOSIS — H40011 Open angle with borderline findings, low risk, right eye: Secondary | ICD-10-CM | POA: Diagnosis not present

## 2019-11-12 DIAGNOSIS — H5310 Unspecified subjective visual disturbances: Secondary | ICD-10-CM | POA: Diagnosis not present

## 2019-11-12 DIAGNOSIS — H35361 Drusen (degenerative) of macula, right eye: Secondary | ICD-10-CM | POA: Diagnosis not present

## 2019-11-12 DIAGNOSIS — Z961 Presence of intraocular lens: Secondary | ICD-10-CM | POA: Diagnosis not present

## 2019-11-12 DIAGNOSIS — H4421 Degenerative myopia, right eye: Secondary | ICD-10-CM | POA: Diagnosis not present

## 2019-11-12 DIAGNOSIS — H43811 Vitreous degeneration, right eye: Secondary | ICD-10-CM | POA: Diagnosis not present

## 2019-11-12 DIAGNOSIS — H35431 Paving stone degeneration of retina, right eye: Secondary | ICD-10-CM | POA: Diagnosis not present

## 2019-11-12 DIAGNOSIS — H15831 Staphyloma posticum, right eye: Secondary | ICD-10-CM | POA: Diagnosis not present

## 2019-11-12 DIAGNOSIS — H21341 Primary cyst of pars plana, right eye: Secondary | ICD-10-CM | POA: Diagnosis not present

## 2019-11-12 DIAGNOSIS — H35411 Lattice degeneration of retina, right eye: Secondary | ICD-10-CM | POA: Diagnosis not present

## 2019-11-20 DIAGNOSIS — Z961 Presence of intraocular lens: Secondary | ICD-10-CM | POA: Diagnosis not present

## 2019-11-20 DIAGNOSIS — H26491 Other secondary cataract, right eye: Secondary | ICD-10-CM | POA: Diagnosis not present

## 2019-12-09 ENCOUNTER — Ambulatory Visit
Admission: RE | Admit: 2019-12-09 | Discharge: 2019-12-09 | Disposition: A | Discharge status: Home / Self Care | Payer: BC Managed Care – PPO | Source: Ambulatory Visit | Attending: Women's Health | Admitting: Women's Health

## 2019-12-09 ENCOUNTER — Other Ambulatory Visit: Payer: Self-pay

## 2019-12-09 DIAGNOSIS — Z13820 Encounter for screening for osteoporosis: Secondary | ICD-10-CM

## 2019-12-09 DIAGNOSIS — Z1231 Encounter for screening mammogram for malignant neoplasm of breast: Secondary | ICD-10-CM | POA: Diagnosis not present

## 2019-12-09 DIAGNOSIS — Z780 Asymptomatic menopausal state: Secondary | ICD-10-CM | POA: Diagnosis not present

## 2019-12-24 NOTE — Telephone Encounter (Signed)
I called patient and per DPR access note on file I left her a message that My Chart message is unread and she can still go there to view it but I read it to her as well in  Her voice mail.

## 2020-11-22 ENCOUNTER — Ambulatory Visit: Payer: BC Managed Care – PPO | Admitting: Nurse Practitioner

## 2020-12-02 ENCOUNTER — Encounter: Payer: Self-pay | Admitting: Nurse Practitioner

## 2020-12-02 ENCOUNTER — Ambulatory Visit (INDEPENDENT_AMBULATORY_CARE_PROVIDER_SITE_OTHER): Payer: BC Managed Care – PPO | Admitting: Nurse Practitioner

## 2020-12-02 ENCOUNTER — Other Ambulatory Visit: Payer: Self-pay

## 2020-12-02 VITALS — BP 118/76 | Ht 65.0 in | Wt 181.0 lb

## 2020-12-02 DIAGNOSIS — Z9079 Acquired absence of other genital organ(s): Secondary | ICD-10-CM | POA: Diagnosis not present

## 2020-12-02 DIAGNOSIS — Z01419 Encounter for gynecological examination (general) (routine) without abnormal findings: Secondary | ICD-10-CM | POA: Diagnosis not present

## 2020-12-02 DIAGNOSIS — Z90722 Acquired absence of ovaries, bilateral: Secondary | ICD-10-CM

## 2020-12-02 DIAGNOSIS — Z90710 Acquired absence of both cervix and uterus: Secondary | ICD-10-CM | POA: Diagnosis not present

## 2020-12-02 NOTE — Progress Notes (Signed)
   [name] September 24, 1960 [id number]   History:  60 y.o. G1P1001 presents for annual exam. No GYN complaints. 2001 TAH BSO for fibroids, no HRT. Normal pap and mammogram history.   Gynecologic History No LMP recorded. Patient has had a hysterectomy.   Contraception/Family planning: status post hysterectomy  Health Maintenance Last Pap: No longer screening per guidelines  Last mammogram: 12/09/2019. Results were: normal Last colonoscopy: 2012 Last Dexa: 12/09/2019. Results were: normal   Past medical history, past surgical history, family history and social history were all reviewed and documented in the EPIC chart.  ROS:  A ROS was performed and pertinent positives and negatives are included.  Exam:  Vitals:   12/02/20 1354  BP: 118/76  Weight: 181 lb (82.1 kg)  Height: 5\' 5"  (1.651 m)   Body mass index is 30.12 kg/m.  General appearance:  Normal Thyroid:  Symmetrical, normal in size, without palpable masses or nodularity. Respiratory  Auscultation:  Clear without wheezing or rhonchi Cardiovascular  Auscultation:  Regular rate, without rubs, murmurs or gallops  Edema/varicosities:  Not grossly evident Abdominal  Soft,nontender, without masses, guarding or rebound.  Liver/spleen:  No organomegaly noted  Hernia:  None appreciated  Skin  Inspection:  Grossly normal   Breasts: Examined lying and sitting.   Right: Without masses, retractions, discharge or axillary adenopathy.   Left: Without masses, retractions, discharge or axillary adenopathy. Gentitourinary   Inguinal/mons:  Normal without inguinal adenopathy  External genitalia:  Normal  BUS/Urethra/Skene's glands:  Normal  Vagina:  Normal  Cervix:  Absent  Uterus:  Absent  Adnexa/parametria:     Rt: Without masses or tenderness.   Lt: Without masses or tenderness.  Anus and perineum: Normal  Digital rectal exam: Normal sphincter tone without palpated masses or tenderness  Assessment/Plan:  60 y.o. G1P1001  for annual exam.   Well female exam with routine gynecological exam - Plan: CBC with Differential/Platelet, Comprehensive metabolic panel. Education provided on SBEs, importance of preventative screenings, current guidelines, high calcium diet, regular exercise, and multivitamin daily.   History of total abdominal hysterectomy and bilateral salpingo-oophorectomy - 2001 for fibroids. No HRT.   Screening for cervical cancer - Normal Pap history.  No longer screening per guidelines.   Screening for breast cancer - Normal mammogram history.  Continue annual screenings.  Normal breast exam today.  Screening for colon cancer - 2012 colonoscopy. Will repeat at GI's recommended interval.   Screening for osteoporosis - Normal DEXA 12/2019. Will repeat at age 27.   Return in 1 year for annual.   Tamela Gammon DNP, 2:20 PM 12/02/2020

## 2020-12-02 NOTE — Patient Instructions (Signed)

## 2020-12-03 LAB — COMPREHENSIVE METABOLIC PANEL
AG Ratio: 1.6 (calc) (ref 1.0–2.5)
ALT: 13 U/L (ref 6–29)
AST: 19 U/L (ref 10–35)
Albumin: 4.2 g/dL (ref 3.6–5.1)
Alkaline phosphatase (APISO): 87 U/L (ref 37–153)
BUN: 19 mg/dL (ref 7–25)
CO2: 27 mmol/L (ref 20–32)
Calcium: 9.3 mg/dL (ref 8.6–10.4)
Chloride: 104 mmol/L (ref 98–110)
Creat: 0.83 mg/dL (ref 0.50–0.99)
Globulin: 2.7 g/dL (calc) (ref 1.9–3.7)
Glucose, Bld: 72 mg/dL (ref 65–99)
Potassium: 3.8 mmol/L (ref 3.5–5.3)
Sodium: 142 mmol/L (ref 135–146)
Total Bilirubin: 0.4 mg/dL (ref 0.2–1.2)
Total Protein: 6.9 g/dL (ref 6.1–8.1)

## 2020-12-03 LAB — CBC WITH DIFFERENTIAL/PLATELET
Absolute Monocytes: 426 cells/uL (ref 200–950)
Basophils Absolute: 20 cells/uL (ref 0–200)
Basophils Relative: 0.4 %
Eosinophils Absolute: 29 cells/uL (ref 15–500)
Eosinophils Relative: 0.6 %
HCT: 38.6 % (ref 35.0–45.0)
Hemoglobin: 12.4 g/dL (ref 11.7–15.5)
Lymphs Abs: 2362 cells/uL (ref 850–3900)
MCH: 29.3 pg (ref 27.0–33.0)
MCHC: 32.1 g/dL (ref 32.0–36.0)
MCV: 91.3 fL (ref 80.0–100.0)
MPV: 10.2 fL (ref 7.5–12.5)
Monocytes Relative: 8.7 %
Neutro Abs: 2063 cells/uL (ref 1500–7800)
Neutrophils Relative %: 42.1 %
Platelets: 204 10*3/uL (ref 140–400)
RBC: 4.23 10*6/uL (ref 3.80–5.10)
RDW: 13.2 % (ref 11.0–15.0)
Total Lymphocyte: 48.2 %
WBC: 4.9 10*3/uL (ref 3.8–10.8)

## 2020-12-13 ENCOUNTER — Other Ambulatory Visit: Payer: Self-pay | Admitting: Obstetrics and Gynecology

## 2020-12-13 ENCOUNTER — Telehealth: Payer: Self-pay | Admitting: Physical Medicine and Rehabilitation

## 2020-12-13 DIAGNOSIS — Z1231 Encounter for screening mammogram for malignant neoplasm of breast: Secondary | ICD-10-CM

## 2020-12-13 NOTE — Telephone Encounter (Signed)
Patient called needing to schedule an appointment with Dr Ernestina Patches for her sciatica acting up again. The number to contact patient is [phone]

## 2020-12-13 NOTE — Telephone Encounter (Signed)
Scheduled for OV on 4/26 at 0930.

## 2020-12-28 ENCOUNTER — Encounter: Payer: Self-pay | Admitting: Physical Medicine and Rehabilitation

## 2020-12-28 ENCOUNTER — Other Ambulatory Visit: Payer: Self-pay

## 2020-12-28 ENCOUNTER — Ambulatory Visit (INDEPENDENT_AMBULATORY_CARE_PROVIDER_SITE_OTHER): Payer: BC Managed Care – PPO | Admitting: Physical Medicine and Rehabilitation

## 2020-12-28 VITALS — BP 115/78 | HR 77

## 2020-12-28 DIAGNOSIS — M545 Low back pain, unspecified: Secondary | ICD-10-CM

## 2020-12-28 DIAGNOSIS — M25552 Pain in left hip: Secondary | ICD-10-CM

## 2020-12-28 DIAGNOSIS — G8929 Other chronic pain: Secondary | ICD-10-CM | POA: Diagnosis not present

## 2020-12-28 DIAGNOSIS — M47816 Spondylosis without myelopathy or radiculopathy, lumbar region: Secondary | ICD-10-CM | POA: Diagnosis not present

## 2020-12-28 NOTE — Progress Notes (Signed)
Pt state lower back pain that travels to her left hip and down her leg. When she is sitting then having to get up is when the pain comes. Pt state bending to tie on shoes makes the pain worse. Pt state she takes over the counter pin meds and uses heating pads to help ease the pain. Pt has hx of PT.  Numeric Pain Rating Scale and Functional Assessment Average Pain 9 Pain Right Now 9 My pain is constant, burning, stabbing, tingling and aching Pain is worse with: bending, sitting and some activites Pain improves with: heat/ice and medication   In the last MONTH (on 0-10 scale) has pain interfered with the following?  1. General activity like being  able to carry out your everyday physical activities such as walking, climbing stairs, carrying groceries, or moving a chair?  Rating(9)  2. Relation with others like being able to carry out your usual social activities and roles such as  activities at home, at work and in your community. Rating(9)  3. Enjoyment of life such that you have  been bothered by emotional problems such as feeling anxious, depressed or irritable?  Rating(9)

## 2020-12-30 ENCOUNTER — Other Ambulatory Visit: Payer: Self-pay

## 2020-12-30 ENCOUNTER — Ambulatory Visit: Payer: Self-pay

## 2020-12-30 ENCOUNTER — Ambulatory Visit (INDEPENDENT_AMBULATORY_CARE_PROVIDER_SITE_OTHER): Payer: BC Managed Care – PPO | Admitting: Physical Medicine and Rehabilitation

## 2020-12-30 ENCOUNTER — Encounter: Payer: Self-pay | Admitting: Physical Medicine and Rehabilitation

## 2020-12-30 VITALS — BP 122/80 | HR 71

## 2020-12-30 DIAGNOSIS — M47816 Spondylosis without myelopathy or radiculopathy, lumbar region: Secondary | ICD-10-CM | POA: Diagnosis not present

## 2020-12-30 MED ORDER — BETAMETHASONE SOD PHOS & ACET 6 (3-3) MG/ML IJ SUSP
12.0000 mg | Freq: Once | INTRAMUSCULAR | Status: AC
  Administered 2020-12-30: 12 mg

## 2020-12-30 NOTE — Progress Notes (Signed)
Pt state lower back that travels to her left hip. Pt state standing makes the pain worse. Pt state she take over the counter pain meds.  Numeric Pain Rating Scale and Functional Assessment Average Pain 8   In the last MONTH (on 0-10 scale) has pain interfered with the following?  1. General activity like being  able to carry out your everyday physical activities such as walking, climbing stairs, carrying groceries, or moving a chair?  Rating(8)   +Driver, -BT, -Dye Allergies.

## 2020-12-30 NOTE — Patient Instructions (Signed)

## 2021-01-03 NOTE — Procedures (Signed)
Lumbar Facet Joint Intra-Articular Injection(s) with Fluoroscopic Guidance  Patient: [name]      Date of Birth: 1960/09/14 MRN: [id number] PCP: Ann Held, DO      Visit Date: 12/30/2020   Universal Protocol:    Date/Time: 12/30/2020  Consent Given By: the patient  Position: PRONE   Additional Comments: Vital signs were monitored before and after the procedure. Patient was prepped and draped in the usual sterile fashion. The correct patient, procedure, and site was verified.   Injection Procedure Details:  Procedure Site One Meds Administered:  Meds ordered this encounter  Medications  . betamethasone acetate-betamethasone sodium phosphate (CELESTONE) injection 12 mg     Laterality: Left  Location/Site:  L5-S1  Needle size: 22 guage  Needle type: Spinal  Needle Placement: Articular  Findings:  -Comments: Excellent flow of contrast producing a partial arthrogram.  Procedure Details: The fluoroscope beam is vertically oriented in AP, and the inferior recess is visualized beneath the lower pole of the inferior apophyseal process, which represents the target point for needle insertion. When direct visualization is difficult the target point is located at the medial projection of the vertebral pedicle. The region overlying each aforementioned target is locally anesthetized with a 1 to 2 ml. volume of 1% Lidocaine without Epinephrine.   The spinal needle was inserted into each of the above mentioned facet joints using biplanar fluoroscopic guidance. A 0.25 to 0.5 ml. volume of Isovue-250 was injected and a partial facet joint arthrogram was obtained. A single spot film was obtained of the resulting arthrogram.    One to 1.25 ml of the steroid/anesthetic solution was then injected into each of the facet joints noted above.   Additional Comments:  The patient tolerated the procedure well Dressing: 2 x 2 sterile gauze and Band-Aid    Post-procedure  details: Patient was observed during the procedure. Post-procedure instructions were reviewed.  Patient left the clinic in stable condition.

## 2021-01-03 NOTE — Progress Notes (Signed)
ALAIZA YAU - 60 y.o. female MRN [id number]  Date of birth: [birth year]  Office Visit Note: Visit Date: 12/30/2020 PCP: Ann Held, DO Referred by: Ann Held, *  Subjective: Chief Complaint  Patient presents with  . Lower Back - Pain  . Left Hip - Pain   HPI:  LIELA RYLEE is a 60 y.o. female who comes in today  for planned Left  L5-S1 Lumbar facet/medial branch block with fluoroscopic guidance.  The patient has failed conservative care including home exercise, medications, time and activity modification.  This injection will be diagnostic and hopefully therapeutic.  Please see requesting physician notes for further details and justification.  Exam has shown concordant pain with facet joint loading.  ROS Otherwise per HPI.  Assessment & Plan: Visit Diagnoses:    ICD-10-CM   1. Spondylosis without myelopathy or radiculopathy, lumbar region  M47.816 XR C-ARM NO REPORT    Facet Injection    betamethasone acetate-betamethasone sodium phosphate (CELESTONE) injection 12 mg    Plan: No additional findings.   Meds & Orders:  Meds ordered this encounter  Medications  . betamethasone acetate-betamethasone sodium phosphate (CELESTONE) injection 12 mg    Orders Placed This Encounter  Procedures  . Facet Injection  . XR C-ARM NO REPORT    Follow-up: Return if symptoms worsen or fail to improve.   Procedures: No procedures performed  Lumbar Facet Joint Intra-Articular Injection(s) with Fluoroscopic Guidance  Patient: KAYMARIE WYNN      Date of Birth: [birth year] MRN: [id number] PCP: Ann Held, DO      Visit Date: 12/30/2020   Universal Protocol:    Date/Time: 12/30/2020  Consent Given By: the patient  Position: PRONE   Additional Comments: Vital signs were monitored before and after the procedure. Patient was prepped and draped in the usual sterile fashion. The correct patient, procedure, and site was verified.   Injection Procedure  Details:  Procedure Site One Meds Administered:  Meds ordered this encounter  Medications  . betamethasone acetate-betamethasone sodium phosphate (CELESTONE) injection 12 mg     Laterality: Left  Location/Site:  L5-S1  Needle size: 22 guage  Needle type: Spinal  Needle Placement: Articular  Findings:  -Comments: Excellent flow of contrast producing a partial arthrogram.  Procedure Details: The fluoroscope beam is vertically oriented in AP, and the inferior recess is visualized beneath the lower pole of the inferior apophyseal process, which represents the target point for needle insertion. When direct visualization is difficult the target point is located at the medial projection of the vertebral pedicle. The region overlying each aforementioned target is locally anesthetized with a 1 to 2 ml. volume of 1% Lidocaine without Epinephrine.   The spinal needle was inserted into each of the above mentioned facet joints using biplanar fluoroscopic guidance. A 0.25 to 0.5 ml. volume of Isovue-250 was injected and a partial facet joint arthrogram was obtained. A single spot film was obtained of the resulting arthrogram.    One to 1.25 ml of the steroid/anesthetic solution was then injected into each of the facet joints noted above.   Additional Comments:  The patient tolerated the procedure well Dressing: 2 x 2 sterile gauze and Band-Aid    Post-procedure details: Patient was observed during the procedure. Post-procedure instructions were reviewed.  Patient left the clinic in stable condition.      Clinical History: MRI LUMBAR SPINE WITHOUT CONTRAST  TECHNIQUE: Multiplanar, multisequence MR imaging of the lumbar spine  was performed. No intravenous contrast was administered.  COMPARISON:  Multiple exams, including lumbar radiographs from 05/05/2019 and abdominal CT from 05/05/2017.  FINDINGS: Segmentation: The lowest lumbar type non-rib-bearing vertebra is labeled as  L5.  Alignment:  No vertebral subluxation is observed.  Vertebrae: Type 1 degenerative endplate findings anteriorly at T11-12.  Conus medullaris and cauda equina: Conus extends to the L1 level. Conus and cauda equina appear normal.  Paraspinal and other soft tissues: Unremarkable  Disc levels:  L1-2: No impingement.  Mild disc bulge.  L2-3: No impingement.  Mild disc bulge.  L3-4: No impingement.  Disc bulge and mild facet arthropathy.  L4-5: No impingement.  Mild disc bulge and mild facet arthropathy.  L5-S1: No impingement.  Bilateral facet arthropathy.  IMPRESSION: 1. Lumbar spondylosis and degenerative disc disease but without significant impingement observed to explain the patient's left-sided symptoms.   Electronically Signed   By: Van Clines M.D.   On: 06/07/2019 10:03     Objective:  VS:  HT:    WT:   BMI:     BP:122/80  HR:71bpm  TEMP: ( )  RESP:  Physical Exam Vitals and nursing note reviewed.  Constitutional:      General: She is not in acute distress.    Appearance: Normal appearance. She is not ill-appearing.  HENT:     Head: Normocephalic and atraumatic.     Right Ear: External ear normal.     Left Ear: External ear normal.  Eyes:     Extraocular Movements: Extraocular movements intact.  Cardiovascular:     Rate and Rhythm: Normal rate.     Pulses: Normal pulses.  Pulmonary:     Effort: Pulmonary effort is normal. No respiratory distress.  Abdominal:     General: There is no distension.     Palpations: Abdomen is soft.  Musculoskeletal:        General: Tenderness present.     Cervical back: Neck supple.     Right lower leg: No edema.     Left lower leg: No edema.     Comments: Patient has good distal strength with no pain over the greater trochanters.  No clonus or focal weakness. Patient somewhat slow to rise from a seated position to full extension.  There is concordant low back pain with facet loading and lumbar  spine extension rotation.  There are no definitive trigger points but the patient is somewhat tender across the lower back and PSIS.  There is no pain with hip rotation.   Skin:    Findings: No erythema, lesion or rash.  Neurological:     General: No focal deficit present.     Mental Status: She is alert and oriented to person, place, and time.     Sensory: No sensory deficit.     Motor: No weakness or abnormal muscle tone.     Coordination: Coordination normal.  Psychiatric:        Mood and Affect: Mood normal.        Behavior: Behavior normal.      Imaging: No results found.

## 2021-01-11 ENCOUNTER — Ambulatory Visit: Payer: BC Managed Care – PPO | Admitting: Orthopaedic Surgery

## 2021-02-03 ENCOUNTER — Ambulatory Visit: Payer: BC Managed Care – PPO

## 2021-02-17 ENCOUNTER — Ambulatory Visit
Admission: RE | Admit: 2021-02-17 | Discharge: 2021-02-17 | Disposition: A | Discharge status: Home / Self Care | Payer: BC Managed Care – PPO | Source: Ambulatory Visit | Attending: Obstetrics and Gynecology | Admitting: Obstetrics and Gynecology

## 2021-02-17 ENCOUNTER — Other Ambulatory Visit: Payer: Self-pay

## 2021-02-17 DIAGNOSIS — Z1231 Encounter for screening mammogram for malignant neoplasm of breast: Secondary | ICD-10-CM

## 2021-03-22 ENCOUNTER — Telehealth: Payer: Self-pay

## 2021-03-22 NOTE — Telephone Encounter (Signed)
Spoke with patient.  X 3-4 days waking each morning with cramping feeling. Today she woke to bright red blood on the toilet tissue and several drops dripped into the toilet. She felt crampy as well.  SHe called to make appointment this week but no appointment openings so triage routed her to me. Please advise. Thanks  AEX 12/02/20 with TW.

## 2021-03-22 NOTE — Telephone Encounter (Signed)
I spoke with CLaudia. Dr. Quincy Simmonds has opening at 10:30am tomorrow. Rosemarie Ax is going to call patient and schedule her for that time.

## 2021-03-22 NOTE — Telephone Encounter (Signed)
Message from appt desk that patient called with "cramping" and wanted to schedule an appointment.  Asked me to triage call.  I called patient and left message to call back to discuss symptoms.

## 2021-03-24 ENCOUNTER — Encounter: Payer: Self-pay | Admitting: Obstetrics & Gynecology

## 2021-03-24 ENCOUNTER — Other Ambulatory Visit: Payer: Self-pay

## 2021-03-24 ENCOUNTER — Ambulatory Visit (INDEPENDENT_AMBULATORY_CARE_PROVIDER_SITE_OTHER): Payer: BC Managed Care – PPO | Admitting: Obstetrics & Gynecology

## 2021-03-24 VITALS — BP 110/72 | HR 76 | Resp 16

## 2021-03-24 DIAGNOSIS — N9089 Other specified noninflammatory disorders of vulva and perineum: Secondary | ICD-10-CM

## 2021-03-24 DIAGNOSIS — Z90722 Acquired absence of ovaries, bilateral: Secondary | ICD-10-CM

## 2021-03-24 DIAGNOSIS — N950 Postmenopausal bleeding: Secondary | ICD-10-CM | POA: Diagnosis not present

## 2021-03-24 DIAGNOSIS — Z90710 Acquired absence of both cervix and uterus: Secondary | ICD-10-CM

## 2021-03-24 DIAGNOSIS — Z9079 Acquired absence of other genital organ(s): Secondary | ICD-10-CM

## 2021-03-24 DIAGNOSIS — N7689 Other specified inflammation of vagina and vulva: Secondary | ICD-10-CM | POA: Diagnosis not present

## 2021-03-24 LAB — WET PREP FOR TRICH, YEAST, CLUE

## 2021-03-24 NOTE — Progress Notes (Signed)
    [name] 1960-11-23 [id number]        60 y.o.  G1P1L1   RP: Left vulvar irritation with light bleeding x 3 days  HPI: S/P TAH/BSO for Fibroids in 2001.  Postmenopause on no HRT.  C/O irritation with mild bleeding x 3 days from the left anterior vulva. Abstinent.  No vulvar trauma.  Urine/BMs normal.   OB History  Gravida Para Term Preterm AB Living  1 1 1    0 1  SAB IAB Ectopic Multiple Live Births      0        # Outcome Date GA Lbr Len/2nd Weight Sex Delivery Anes PTL Lv  1 Term             Past medical history,surgical history, problem list, medications, allergies, family history and social history were all reviewed and documented in the EPIC chart.   Directed ROS with pertinent positives and negatives documented in the history of present illness/assessment and plan.  Exam:  Vitals:   03/24/21 0944  BP: 110/72  Pulse: 76  Resp: 16   General appearance:  Normal  Abdomen: Normal  Gynecologic exam: Vulva:  Red flat lesion about 1 cm in diameter at the left anterior inner labia minora.  Not currently bleeding.  Speculum:  Vagina normal.  Normal secretions.  Wet prep done.  Bimanual exam:  No pelvic mass felt.  NT.  Wet prep Neg  U/A: Yellow clear, Nit Neg, Pro Neg, WBC 0-5, RBC 0-2, Bacteria Few.  Pending U. Culture.   Assessment/Plan:  60 y.o. G1P1001   1. Vulvar irritation Wet prep negative and urine analysis minimally perturbed.  Vulvar irritation probably due to the left anterior vulvar lesion. - WET PREP FOR TRICH, YEAST, CLUE - Urinalysis,Complete w/RFL Culture  2. Postmenopausal bleeding Status post total hysterectomy.  Gynecologic exam showing a normal vagina.  Left anterior inner labia minora lesion measuring about 1 cm in diameter, erythematous and flat.  Probably at the origin of the postmenopausal bleeding, but not currently actively bleeding.  Patient will follow up for a vulvar biopsy at that site.  3. Vulvar lesion Left anterior inner  labia minora flat lesion about 1 cm in diameter with erythema.  Not actively bleeding currently.  Follow-ups as soon as possible for vulvar biopsy at that site. - Biopsy vulva; Future  4. S/P TAH-BSO  Other orders - Ibuprofen-Acetaminophen (ADVIL DUAL ACTION PO); Take by mouth. - Acetaminophen (TYLENOL ARTHRITIS PAIN PO); Take by mouth.   Princess Bruins MD, 10:30 AM 03/24/2021

## 2021-03-26 LAB — URINALYSIS, COMPLETE W/RFL CULTURE
Bilirubin Urine: NEGATIVE
Glucose, UA: NEGATIVE
Hyaline Cast: NONE SEEN /LPF
Ketones, ur: NEGATIVE
Leukocyte Esterase: NEGATIVE
Nitrites, Initial: NEGATIVE
Protein, ur: NEGATIVE
Specific Gravity, Urine: 1.025 (ref 1.001–1.035)
pH: 6 (ref 5.0–8.0)

## 2021-03-26 LAB — URINE CULTURE
MICRO NUMBER:: 12147214
Result:: NO GROWTH
SPECIMEN QUALITY:: ADEQUATE

## 2021-03-26 LAB — CULTURE INDICATED

## 2021-03-28 DIAGNOSIS — H524 Presbyopia: Secondary | ICD-10-CM | POA: Diagnosis not present

## 2021-03-28 DIAGNOSIS — H2522 Age-related cataract, morgagnian type, left eye: Secondary | ICD-10-CM | POA: Diagnosis not present

## 2021-03-28 DIAGNOSIS — Z961 Presence of intraocular lens: Secondary | ICD-10-CM | POA: Diagnosis not present

## 2021-03-28 DIAGNOSIS — H40013 Open angle with borderline findings, low risk, bilateral: Secondary | ICD-10-CM | POA: Diagnosis not present

## 2021-03-30 ENCOUNTER — Ambulatory Visit: Payer: BC Managed Care – PPO | Admitting: Nurse Practitioner

## 2021-03-31 ENCOUNTER — Ambulatory Visit: Payer: BC Managed Care – PPO | Admitting: Obstetrics & Gynecology

## 2021-05-22 ENCOUNTER — Encounter: Payer: Self-pay | Admitting: Physical Medicine and Rehabilitation

## 2021-05-22 NOTE — Progress Notes (Signed)
[name] - 60 y.o. female MRN [id number]  Date of birth: [birth year]  Office Visit Note: Visit Date: 12/28/2020 PCP: Ann Held, DO Referred by: Ann Held, *  Subjective: Chief Complaint  Patient presents with   Lower Back - Pain   Left Hip - Pain   Left Leg - Pain   HPI: [name] is a 60 y.o. female who comes in today At the request of Dr. Rosemarie Beath for reevaluation of chronic worsening severe axial low back pain with some referral to the left hip and leg but it does not go past the knee.  She gets some pain going forward flexed and bending to tie her shoe and then coming back up.  She denies any specific groin pain.  She rates her pain as a 9 out of 10 burning stabbing and tingling type pain in the low back and hip.  She gets some relief with heat and ice and change position medication.  She has had physical therapy without relief long-term.  She denies any real radicular type symptoms at any specific dermatomes.  No left-sided complaints.  It does interfere with her daily living.  Her case is complicated by history of arthritis and chronic pain.  She denies any focal weakness.  No specific trauma.  No prior history of lumbar surgery.  She has had prior lumbar facet blocks and a double block paradigm on the left at L5-S1 with really good relief of her symptoms in 2020.  We were looking at potential for radiofrequency ablation at the time.  MRI from 2020 showed bilateral facet arthropathy L5-S1 without lumbar stenosis without nerve compression.  This did relieve the symptoms into her hip and thigh.  Diagnostically this proves that this was more of a referral pattern from the facet joint versus a nerve pattern.  Review of Systems  Musculoskeletal:  Positive for back pain and joint pain.  All other systems reviewed and are negative. Otherwise per HPI.  Assessment & Plan: Visit Diagnoses:    ICD-10-CM   1. Spondylosis without myelopathy or  radiculopathy, lumbar region  M47.816     2. Chronic bilateral low back pain without sciatica  M54.50    G89.29     3. Pain in left hip  M25.552        Plan: Findings:  Chronic worsening severe axial low back pain left more than right with referral pattern in the left hip and thigh.  Significant decrease in symptoms with prior facet joint blocks in 2020.  Never had radiofrequency ablation.  History of differential diagnosis injections of bursitis and lumbar radiculopathy.  MRI showing mainly facet arthropathy without any nerve compression.  Diagnostically she got good relief with the facet block and were going to repeat that again.  Depending on relief would look at possible for radiofrequency ablation.  This was gone over with her at length today she did get some information on radiofrequency ablation of the lumbar facet joints.  She will continue with current medications.   Meds & Orders: No orders of the defined types were placed in this encounter.  No orders of the defined types were placed in this encounter.   Follow-up: Return for Left L5-S1 facet joint block..   Procedures: No procedures performed      Clinical History: MRI LUMBAR SPINE WITHOUT CONTRAST   TECHNIQUE: Multiplanar, multisequence MR imaging of the lumbar spine was performed. No intravenous contrast was administered.   COMPARISON:  Multiple exams, including lumbar radiographs from 05/05/2019 and abdominal CT from 05/05/2017.   FINDINGS: Segmentation: The lowest lumbar type non-rib-bearing vertebra is labeled as L5.   Alignment:  No vertebral subluxation is observed.   Vertebrae: Type 1 degenerative endplate findings anteriorly at T11-12.   Conus medullaris and cauda equina: Conus extends to the L1 level. Conus and cauda equina appear normal.   Paraspinal and other soft tissues: Unremarkable   Disc levels:   L1-2: No impingement.  Mild disc bulge.   L2-3: No impingement.  Mild disc bulge.   L3-4: No  impingement.  Disc bulge and mild facet arthropathy.   L4-5: No impingement.  Mild disc bulge and mild facet arthropathy.   L5-S1: No impingement.  Bilateral facet arthropathy.   IMPRESSION: 1. Lumbar spondylosis and degenerative disc disease but without significant impingement observed to explain the patient's left-sided symptoms.     Electronically Signed   By: Van Clines M.D.   On: 06/07/2019 10:03   She reports that she has never smoked. She has never used smokeless tobacco. No results for input(s): HGBA1C, LABURIC in the last 8760 hours.  Objective:  VS:  HT:    WT:   BMI:     BP:115/78  HR:77bpm  TEMP: ( )  RESP:  Physical Exam Vitals and nursing note reviewed.  Constitutional:      General: She is not in acute distress.    Appearance: Normal appearance. She is not ill-appearing.  HENT:     Head: Normocephalic and atraumatic.     Right Ear: External ear normal.     Left Ear: External ear normal.  Eyes:     Extraocular Movements: Extraocular movements intact.  Cardiovascular:     Rate and Rhythm: Normal rate.     Pulses: Normal pulses.  Pulmonary:     Effort: Pulmonary effort is normal. No respiratory distress.  Abdominal:     General: There is no distension.     Palpations: Abdomen is soft.  Musculoskeletal:        General: Tenderness present.     Cervical back: Neck supple.     Right lower leg: No edema.     Left lower leg: No edema.     Comments: Patient has good distal strength with no pain over the greater trochanters.  No clonus or focal weakness. Patient somewhat slow to rise from a seated position to full extension.  There is concordant low back pain with facet loading and lumbar spine extension rotation.  There are no definitive trigger points but the patient is somewhat tender across the lower back and PSIS.  There is no pain with hip rotation.   Skin:    Findings: No erythema, lesion or rash.  Neurological:     General: No focal deficit  present.     Mental Status: She is alert and oriented to person, place, and time.     Sensory: No sensory deficit.     Motor: No weakness or abnormal muscle tone.     Coordination: Coordination normal.  Psychiatric:        Mood and Affect: Mood normal.        Behavior: Behavior normal.    Ortho Exam  Imaging: No results found.  Past Medical/Family/Surgical/Social History: Medications & Allergies reviewed per EMR, new medications updated. Patient Active Problem List   Diagnosis Date Noted   Lumbar radiculopathy 05/29/2017   Greater trochanteric bursitis of left hip 05/29/2017   Contusion, hip and thigh,  left, initial encounter 05/29/2017   Left hip pain 04/30/2017   Abdominal pain, LLQ 04/30/2017   Back pain 02/16/2015   Obesity (BMI 30-39.9) 06/09/2013   OSTEOARTHRITIS 12/15/2009   IMPAIRMENT, ONE EYE MODERATE, OTHER NOS 07/01/2007   SBO [id number]   HERNIA, UMBILICAL [id number]   Past Medical History:  Diagnosis Date   Arthritis    rheumatoid per pt   Family History  Problem Relation Age of Onset   Heart disease Mother 54       MI   Stroke Mother    Hypertension Mother    Arthritis Mother    Pulmonary embolism Mother    Hypertension Father    Arthritis Sister    Colon cancer Neg Hx    Colon polyps Neg Hx    Past Surgical History:  Procedure Laterality Date   ABDOMINAL HYSTERECTOMY  2001   TAH/BSO fibroids   CATARACT EXTRACTION     obstructed bile duct  2004   Dr Sharmaine Base   OOPHORECTOMY     BSO   ROTATOR CUFF REPAIR  12/2010   Right   Social History   Occupational History   Occupation: MEAT CUTTER    Employer: [employer]  Tobacco Use   Smoking status: Never   Smokeless tobacco: Never  Vaping Use   Vaping Use: Never used  Substance and Sexual Activity   Alcohol use: Yes    Alcohol/week: 0.0 standard drinks    Comment: Occas. wine   Drug use: No   Sexual activity: Not Currently    Partners: Male    Birth control/protection: Surgical     Comment: 1st intercourse 60 yo-More than 5 partners,hysterectomy

## 2021-06-08 ENCOUNTER — Encounter: Payer: Self-pay | Admitting: Gastroenterology

## 2021-06-29 ENCOUNTER — Ambulatory Visit (INDEPENDENT_AMBULATORY_CARE_PROVIDER_SITE_OTHER): Payer: BC Managed Care – PPO | Admitting: Family Medicine

## 2021-06-29 ENCOUNTER — Ambulatory Visit: Payer: BC Managed Care – PPO | Admitting: Medical

## 2021-06-29 ENCOUNTER — Encounter: Payer: Self-pay | Admitting: Family Medicine

## 2021-06-29 ENCOUNTER — Other Ambulatory Visit: Payer: Self-pay

## 2021-06-29 VITALS — BP 110/78 | HR 80 | Temp 98.9°F | Ht 65.0 in | Wt 178.0 lb

## 2021-06-29 DIAGNOSIS — J0100 Acute maxillary sinusitis, unspecified: Secondary | ICD-10-CM | POA: Diagnosis not present

## 2021-06-29 MED ORDER — PROMETHAZINE-DM 6.25-15 MG/5ML PO SYRP: 5.0000 mL | ORAL_SOLUTION | Freq: Four times a day (QID) | ORAL | 0 refills | Status: DC | PRN

## 2021-06-29 MED ORDER — PREDNISONE 20 MG PO TABS: 40.0000 mg | ORAL_TABLET | Freq: Every day | ORAL | 0 refills | Status: AC

## 2021-06-29 MED ORDER — AMOXICILLIN-POT CLAVULANATE 875-125 MG PO TABS: 1.0000 | ORAL_TABLET | Freq: Two times a day (BID) | ORAL | 0 refills | Status: AC

## 2021-06-29 NOTE — Progress Notes (Signed)
Chief Complaint  Patient presents with   Cough    Headache and congestion Negative Covid test     [name] here for URI complaints.  Duration: 10 days  Associated symptoms: sinus congestion, sinus pain, rhinorrhea, sore throat, wheezing, shortness of breath, upper dental pain on L, loss of taste/smell, and coughing Denies: itchy watery eyes, ear fullness, ear pain, ear drainage, myalgia, and fevers, N/V/D Treatment to date: Mucinex D, Theraflu Sick contacts: No Tested neg for covid.   Past Medical History:  Diagnosis Date   Arthritis    rheumatoid per pt    Objective BP 110/78   Pulse 80   Temp 98.9 F (37.2 C) (Oral)   Ht 5\' 5"  (1.651 m)   Wt 178 lb (80.7 kg)   SpO2 99%   BMI 29.62 kg/m  General: Awake, alert, appears stated age HEENT: AT, [place], ears patent b/l and TM's neg, nares patent w/o discharge, L max sinus ttp, pharynx pink and without exudates, MMM Neck: No masses or asymmetry Heart: RRR Lungs: CTAB, no accessory muscle use Psych: Age appropriate judgment and insight, normal mood and affect  Acute maxillary sinusitis, recurrence not specified - Plan: predniSONE (DELTASONE) 20 MG tablet, amoxicillin-clavulanate (AUGMENTIN) 875-125 MG tablet, promethazine-dextromethorphan (PROMETHAZINE-DM) 6.25-15 MG/5ML syrup  Given duration and dental pain, will tx w augmentin. Pred for s/s's. Syrup prn, warned about drowsiness.  Continue to push fluids, practice good hand hygiene, cover mouth when coughing. F/u prn. If starting to experience fevers, shaking, or shortness of breath, seek immediate care. Pt voiced understanding and agreement to the plan.  [place], DO 06/29/21 11:24 AM

## 2021-06-29 NOTE — Patient Instructions (Signed)
Continue to push fluids, practice good hand hygiene, and cover your mouth if you cough.  If you start having fevers, shaking or shortness of breath, seek immediate care.  Let us know if you need anything.  

## 2021-07-21 ENCOUNTER — Encounter: Payer: Self-pay | Admitting: Gastroenterology

## 2021-09-08 ENCOUNTER — Other Ambulatory Visit: Payer: Self-pay

## 2021-09-08 ENCOUNTER — Ambulatory Visit (AMBULATORY_SURGERY_CENTER): Payer: BC Managed Care – PPO | Admitting: *Deleted

## 2021-09-08 VITALS — Ht 65.0 in | Wt 170.0 lb

## 2021-09-08 DIAGNOSIS — Z1211 Encounter for screening for malignant neoplasm of colon: Secondary | ICD-10-CM

## 2021-09-08 MED ORDER — NA SULFATE-K SULFATE-MG SULF 17.5-3.13-1.6 GM/177ML PO SOLN: 1.0000 | Freq: Once | ORAL | 0 refills | Status: AC

## 2021-09-08 NOTE — Progress Notes (Signed)
No egg or soy allergy known to patient  No issues known to pt with past sedation with any surgeries or procedures Patient denies ever being told they had issues or difficulty with intubation  No FH of Malignant Hyperthermia Pt is not on diet pills Pt is not on  home 02  Pt is not on blood thinners  Pt denies issues with constipation  No A fib or A flutter  Pt is fully vaccinated  for Covid     NO PA's for preps discussed with pt In PV today  Discussed with pt there will be an out-of-pocket cost for prep and that varies from $0 to 70 +  dollars - pt verbalized understanding   Due to the COVID-19 pandemic we are asking patients to follow certain guidelines in PV and the [place]   Pt aware of COVID protocols and [facility] guidelines   PV completed over the phone. Pt verified name, DOB, address and insurance during PV today.  Pt mailed instruction packet with copy of consent form to read and not return, and instructions.   Pt encouraged to call with questions or issues.  If pt has My chart, procedure instructions sent via My Chart

## 2021-09-15 ENCOUNTER — Encounter: Payer: Self-pay | Admitting: Gastroenterology

## 2021-09-21 ENCOUNTER — Other Ambulatory Visit: Payer: Self-pay

## 2021-09-21 ENCOUNTER — Encounter: Payer: Self-pay | Admitting: Gastroenterology

## 2021-09-21 ENCOUNTER — Ambulatory Visit (AMBULATORY_SURGERY_CENTER): Payer: BC Managed Care – PPO | Admitting: Gastroenterology

## 2021-09-21 VITALS — BP 126/75 | HR 56 | Temp 96.2°F | Resp 11 | Ht 65.0 in

## 2021-09-21 DIAGNOSIS — Z8371 Family history of colonic polyps: Secondary | ICD-10-CM | POA: Diagnosis not present

## 2021-09-21 DIAGNOSIS — D124 Benign neoplasm of descending colon: Secondary | ICD-10-CM | POA: Diagnosis not present

## 2021-09-21 DIAGNOSIS — D122 Benign neoplasm of ascending colon: Secondary | ICD-10-CM | POA: Diagnosis not present

## 2021-09-21 DIAGNOSIS — Z1211 Encounter for screening for malignant neoplasm of colon: Secondary | ICD-10-CM

## 2021-09-21 MED ORDER — SODIUM CHLORIDE 0.9 % IV SOLN: 500.0000 mL | INTRAVENOUS | Status: DC

## 2021-09-21 NOTE — Op Note (Signed)
[facility] Patient Name: Asanti Craigo Procedure Date: 09/21/2021 8:40 AM MRN: [id number] Endoscopist: Thornton Park MD, MD Age: 61 Referring MD:  Date of Birth: [birth year] Gender: Female Account #: [id number] Procedure:                Colonoscopy Indications:              Screening for colorectal malignant neoplasm                           Normal screening colonoscopy with Dr. Olevia Perches 2012                           Father with polyps (pathology unknown)                           No known family history of colon cancer Medicines:                Monitored Anesthesia Care Procedure:                Pre-Anesthesia Assessment:                           - Prior to the procedure, a History and Physical                            was performed, and patient medications and                            allergies were reviewed. The patient's tolerance of                            previous anesthesia was also reviewed. The risks                            and benefits of the procedure and the sedation                            options and risks were discussed with the patient.                            All questions were answered, and informed consent                            was obtained. Prior Anticoagulants: The patient has                            taken no previous anticoagulant or antiplatelet                            agents. ASA Grade Assessment: II - A patient with                            mild systemic disease. After reviewing the risks  and benefits, the patient was deemed in                            satisfactory condition to undergo the procedure.                           After obtaining informed consent, the colonoscope                            was passed under direct vision. Throughout the                            procedure, the patient's blood pressure, pulse, and                            oxygen saturations were monitored  continuously. The                            CF [id number] #[id number] was introduced through the anus                            and advanced to the 3 cm into the ileum. A second                            forward view of the right colon was performed. The                            colonoscopy was performed without difficulty. The                            patient tolerated the procedure well. The quality                            of the bowel preparation was good. The terminal                            ileum, ileocecal valve, appendiceal orifice, and                            rectum were photographed. Scope In: 8:46:16 AM Scope Out: 9:01:22 AM Scope Withdrawal Time: 0 hours 13 minutes 9 seconds  Total Procedure Duration: 0 hours 15 minutes 6 seconds  Findings:                 The perianal and digital rectal examinations were                            normal.                           A few small and large-mouthed diverticula were                            found in the sigmoid colon and descending colon.  Two sessile polyps were found in the descending                            colon. The polyps were 2 to 3 mm in size. These                            polyps were removed with a cold snare. Resection                            and retrieval were complete. Estimated blood loss                            was minimal.                           Two sessile polyps were found in the ascending                            colon. The polyps were 1 to 3 mm in size. These                            polyps were removed with a cold snare. Resection                            and retrieval were complete. Estimated blood loss                            was minimal.                           The exam was otherwise without abnormality on                            direct and retroflexion views. Complications:            No immediate complications. Estimated blood loss:                             Minimal. Estimated Blood Loss:     Estimated blood loss was minimal. Impression:               - Diverticulosis in the sigmoid colon and in the                            descending colon.                           - Two 2 to 3 mm polyps in the descending colon,                            removed with a cold snare. Resected and retrieved.                           - Two 1 to 3 mm polyps in the ascending colon,  removed with a cold snare. Resected and retrieved.                           - The examination was otherwise normal on direct                            and retroflexion views. Recommendation:           - Patient has a contact number available for                            emergencies. The signs and symptoms of potential                            delayed complications were discussed with the                            patient. Return to normal activities tomorrow.                            Written discharge instructions were provided to the                            patient.                           - Resume previous diet. High fiber diet recommended.                           - Continue present medications.                           - Await pathology results.                           - Repeat colonoscopy date to be determined after                            pending pathology results are reviewed for                            surveillance.                           - Emerging evidence supports eating a diet of                            fruits, vegetables, grains, calcium, and yogurt                            while reducing red meat and alcohol may reduce the                            risk of colon cancer.                           -  Thank you for allowing me to be involved in your                            colon cancer prevention. Thornton Park MD, MD 09/21/2021 9:06:14 AM This report has been signed electronically.

## 2021-09-21 NOTE — Progress Notes (Signed)
To PACU, VSS. Report to [email or web address] 

## 2021-09-21 NOTE — Progress Notes (Signed)
° °  Referring Provider: Carollee Herter, Alferd Apa, * Primary Care Physician:  Carollee Herter, Alferd Apa, DO  Reason for Procedure:  Colon cancer screening   IMPRESSION:  Need for colon cancer screening Appropriate candidate for monitored anesthesia care  PLAN: Colonoscopy in the [facility] today   HPI: [name] is a 61 y.o. female presents for screening colonoscopy.  Colonoscopy with Dr. Olevia Perches in 2012 showed sigmoid diverticulosis. Surveillance recommended in 10 years.   No baseline GI symptoms.   No known family history of colon cancer or polyps. No family history of uterine/endometrial cancer, pancreatic cancer or gastric/stomach cancer.   Past Medical History:  Diagnosis Date   Arthritis    rheumatoid per pt    Past Surgical History:  Procedure Laterality Date   ABDOMINAL HYSTERECTOMY  2001   TAH/BSO fibroids   CATARACT EXTRACTION Right    COLONOSCOPY  04/27/2011   obstructed bile duct  2004   Dr Sharmaine Base   OOPHORECTOMY     BSO   ROTATOR CUFF REPAIR Bilateral 12/2010   Left and right    Current Outpatient Medications  Medication Sig Dispense Refill   Acetaminophen (TYLENOL ARTHRITIS PAIN PO) Take by mouth.     Cholecalciferol (VITAMIN D PO) Take by mouth.     Ibuprofen-Acetaminophen (ADVIL DUAL ACTION PO) Take by mouth.     Multiple Vitamin (MULTIVITAMIN) tablet Take 1 tablet by mouth daily.     Current Facility-Administered Medications  Medication Dose Route Frequency Provider Last Rate Last Admin   0.9 %  sodium chloride infusion  500 mL Intravenous Continuous Thornton Park, MD        Allergies as of 09/21/2021   (No Known Allergies)    Family History  Problem Relation Age of Onset   Heart disease Mother 76       MI   Stroke Mother    Hypertension Mother    Arthritis Mother    Pulmonary embolism Mother    Prostate cancer Father    Hypertension Father    Arthritis Sister    Colon cancer Neg Hx    Colon polyps Neg Hx    Esophageal cancer Neg Hx     Rectal cancer Neg Hx    Stomach cancer Neg Hx      Physical Exam: General:   Alert,  well-nourished, pleasant and cooperative in NAD Head:  Normocephalic and atraumatic. Eyes:  Sclera clear, no icterus.   Conjunctiva pink. Mouth:  No deformity or lesions.   Neck:  Supple; no masses or thyromegaly. Lungs:  Clear throughout to auscultation.   No wheezes. Heart:  Regular rate and rhythm; no murmurs. Abdomen:  Soft, non-tender, nondistended, normal bowel sounds, no rebound or guarding.  Msk:  Symmetrical. No boney deformities LAD: No inguinal or umbilical LAD Extremities:  No clubbing or edema. Neurologic:  Alert and  oriented x4;  grossly nonfocal Skin:  No obvious rash or bruise. Psych:  Alert and cooperative. Normal mood and affect.     Studies/Results: No results found.    [name] L. Tarri Glenn, MD, MPH 09/21/2021, 8:36 AM

## 2021-09-21 NOTE — Patient Instructions (Signed)
4 polyps removed- await pathology  Continue your normal medications  Please read over handouts about polyps and diverticulosis  Emerging evidence supports eating a diet of fruits, vegetables, grains, calcium, and yogurt while reducing red meat and alcohol may reduce the risk of colon cancer   YOU HAD AN ENDOSCOPIC PROCEDURE TODAY AT [facility]:   Refer to the procedure report that was given to you for any specific questions about what was found during the examination.  If the procedure report does not answer your questions, please call your gastroenterologist to clarify.  If you requested that your care partner not be given the details of your procedure findings, then the procedure report has been included in a sealed envelope for you to review at your convenience later.  YOU SHOULD EXPECT: Some feelings of bloating in the abdomen. Passage of more gas than usual.  Walking can help get rid of the air that was put into your GI tract during the procedure and reduce the bloating. If you had a lower endoscopy (such as a colonoscopy or flexible sigmoidoscopy) you may notice spotting of blood in your stool or on the toilet paper. If you underwent a bowel prep for your procedure, you may not have a normal bowel movement for a few days.  Please Note:  You might notice some irritation and congestion in your nose or some drainage.  This is from the oxygen used during your procedure.  There is no need for concern and it should clear up in a day or so.  SYMPTOMS TO REPORT IMMEDIATELY:  Following lower endoscopy (colonoscopy or flexible sigmoidoscopy):  Excessive amounts of blood in the stool  Significant tenderness or worsening of abdominal pains  Swelling of the abdomen that is new, acute  Fever of 100F or higher  For urgent or emergent issues, a gastroenterologist can be reached at any hour by calling [phone]. Do not use MyChart messaging for urgent concerns.    DIET:  We do  recommend a small meal at first, but then you may proceed to your regular diet.  Drink plenty of fluids but you should avoid alcoholic beverages for 24 hours.  ACTIVITY:  You should plan to take it easy for the rest of today and you should NOT DRIVE or use heavy machinery until tomorrow (because of the sedation medicines used during the test).    FOLLOW UP: Our staff will call the number listed on your records 48-72 hours following your procedure to check on you and address any questions or concerns that you may have regarding the information given to you following your procedure. If we do not reach you, we will leave a message.  We will attempt to reach you two times.  During this call, we will ask if you have developed any symptoms of COVID 19. If you develop any symptoms (ie: fever, flu-like symptoms, shortness of breath, cough etc.) before then, please call [phone].  If you test positive for Covid 19 in the 2 weeks post procedure, please call and report this information to [place].    If any biopsies were taken you will be contacted by phone or by letter within the next 1-3 weeks.  Please call us at [phone] if you have not heard about the biopsies in 3 weeks.    SIGNATURES/CONFIDENTIALITY: You and/or your care partner have signed paperwork which will be entered into your electronic medical record.  These signatures attest to the fact that that the information  above on your After Visit Summary has been reviewed and is understood.  Full responsibility of the confidentiality of this discharge information lies with you and/or your care-partner.

## 2021-09-21 NOTE — Progress Notes (Signed)
Called to room to assist during endoscopic procedure.  Patient ID and intended procedure confirmed with present staff. Received instructions for my participation in the procedure from the performing physician.  

## 2021-09-23 ENCOUNTER — Encounter: Payer: Self-pay | Admitting: Gastroenterology

## 2021-09-23 ENCOUNTER — Telehealth: Payer: Self-pay

## 2021-09-23 ENCOUNTER — Telehealth: Payer: Self-pay | Admitting: *Deleted

## 2021-09-23 NOTE — Telephone Encounter (Signed)
°  Follow up Call-  Call back number 09/21/2021  Post procedure Call Back phone  # [phone]  Permission to leave phone message Yes  Some recent data might be hidden     Patient questions:  Do you have a fever, pain , or abdominal swelling? No. Pain Score  0 *  Have you tolerated food without any problems? Yes.    Have you been able to return to your normal activities? Yes.    Do you have any questions about your discharge instructions: Diet   No. Medications  No. Follow up visit  No.  Do you have questions or concerns about your Care? No.  Actions: * If pain score is 4 or above: No action needed, pain <4.  Have you developed a fever since your procedure? no  2.   Have you had an respiratory symptoms (SOB or cough) since your procedure? no  3.   Have you tested positive for COVID 19 since your procedure no  4.   Have you had any family members/close contacts diagnosed with the COVID 19 since your procedure?  no   If yes to any of these questions please route to Joylene John, RN and Joella Prince, RN

## 2021-09-23 NOTE — Telephone Encounter (Signed)
°  Follow up Call-  Call back number 09/21/2021  Post procedure Call Back phone  # [phone]  Permission to leave phone message Yes  Some recent data might be hidden     Patient questions:  1st attempt for follow-up phone call.  No answer, left msg on voice mail.

## 2021-10-31 ENCOUNTER — Ambulatory Visit (INDEPENDENT_AMBULATORY_CARE_PROVIDER_SITE_OTHER): Payer: BC Managed Care – PPO | Admitting: Physician Assistant

## 2021-10-31 ENCOUNTER — Ambulatory Visit: Payer: Self-pay

## 2021-10-31 ENCOUNTER — Encounter: Payer: Self-pay | Admitting: Physician Assistant

## 2021-10-31 DIAGNOSIS — M1812 Unilateral primary osteoarthritis of first carpometacarpal joint, left hand: Secondary | ICD-10-CM

## 2021-10-31 MED ORDER — METHYLPREDNISOLONE 4 MG PO TABS: ORAL_TABLET | ORAL | 0 refills | Status: DC

## 2021-10-31 NOTE — Progress Notes (Signed)
Office Visit Note   Patient: [name]           Date of Birth: 10/27/1960           MRN: [id number] Visit Date: 10/31/2021              Requested by: [address] [place], [place] [address] [place],  [place] [address] PCP: Carollee Herter, Alferd Apa, DO   Assessment & Plan: Visit Diagnoses:  1. Arthritis of carpometacarpal (CMC) joint of left thumb     Plan: She is placed on a Medrol Dosepak.  She is given a thumb spica splint she can wear when up during the day she does not need to wear it at night.  See her back in 2 weeks to see what type of response she had to try to gain a better exam on her hopefully with decreased pain.  Follow-Up Instructions: Return in about 2 weeks (around 11/14/2021).   Orders:  Orders Placed This Encounter  Procedures   XR Wrist Complete Left   Meds ordered this encounter  Medications   methylPREDNISolone (MEDROL) 4 MG tablet    Sig: Take as directed    Dispense:  21 tablet    Refill:  0      Procedures: No procedures performed   Clinical Data: No additional findings.   Subjective: Chief Complaint  Patient presents with   Left Wrist - Pain    HPI [name] comes in today with left wrist pain for 2 weeks no known injury.  She does have a job where she uses her hands doing a lot of heavy lifting.  She states she has pain in her left thumb area with twisting motion.  She notes swelling around the thumb.  She has tried NSAIDs but states this makes it worse.  She describes the pain as a burning stinging pain and points to the thenar and thumb region.  No known injury.  She is nondiabetic. Review of Systems Denies fevers chills.  Objective: Vital Signs: There were no vitals taken for this visit.  Physical Exam General well-developed well-nourished female no acute distress mood and affect appropriate. Psych: Alert and oriented x3 Ortho Exam Bilateral wrist she has negative Tinel's over the median nerve.  Subjective  decrease sensation left thumb to light touch otherwise sensation intact throughout both hands.  Full motor bilateral hands.  She has pain with grind test is unable to really tolerate this on the left.  Wynn Maudlin also unable to perform secondary to pain.  There is no ecchymosis impending ulcers skin breakdown or edema about the left hand tickly at the thumb.  She has tenderness over the first extensor compartment but maximal tenderness is at the [facility] joint region. Specialty Comments:  No specialty comments available.  Imaging: XR Wrist Complete Left  Result Date: 10/31/2021 Left wrist 3 views: No acute fracture.  CMC joint arthritic changes moderate.  Thumb is well located.    PMFS History: Patient Active Problem List   Diagnosis Date Noted   Lumbar radiculopathy 05/29/2017   Greater trochanteric bursitis of left hip 05/29/2017   Contusion, hip and thigh, left, initial encounter 05/29/2017   Left hip pain 04/30/2017   Abdominal pain, LLQ 04/30/2017   Back pain 02/16/2015   Obesity (BMI 30-39.9) 06/09/2013   OSTEOARTHRITIS 12/15/2009   IMPAIRMENT, ONE EYE MODERATE, OTHER NOS 07/01/2007   SBO 95/63/8756   HERNIA, UMBILICAL 43/32/9518   Past Medical History:  Diagnosis Date   Arthritis    rheumatoid per pt    Family History  Problem Relation Age of Onset   Heart disease Mother 47       MI   Stroke Mother    Hypertension Mother    Arthritis Mother    Pulmonary embolism Mother    Prostate cancer Father    Hypertension Father    Arthritis Sister    Colon cancer Neg Hx    Colon polyps Neg Hx    Esophageal cancer Neg Hx    Rectal cancer Neg Hx    Stomach cancer Neg Hx     Past Surgical History:  Procedure Laterality Date   ABDOMINAL HYSTERECTOMY  2001   TAH/BSO fibroids   CATARACT EXTRACTION Right    COLONOSCOPY  04/27/2011   obstructed bile duct  2004   Dr Sharmaine Base   OOPHORECTOMY     BSO   ROTATOR CUFF REPAIR Bilateral 12/2010   Left and right   Social History    Occupational History   Occupation: MEAT CUTTER    Employer: [employer]  Tobacco Use   Smoking status: Never   Smokeless tobacco: Never  Vaping Use   Vaping Use: Never used  Substance and Sexual Activity   Alcohol use: Yes    Alcohol/week: 0.0 standard drinks    Comment: Occas. wine   Drug use: No   Sexual activity: Not Currently    Partners: Male    Birth control/protection: Surgical    Comment: 1st intercourse 61 yo-More than 5 partners,hysterectomy

## 2021-11-28 ENCOUNTER — Ambulatory Visit (INDEPENDENT_AMBULATORY_CARE_PROVIDER_SITE_OTHER): Payer: BC Managed Care – PPO | Admitting: Physician Assistant

## 2021-11-28 DIAGNOSIS — M1812 Unilateral primary osteoarthritis of first carpometacarpal joint, left hand: Secondary | ICD-10-CM

## 2021-11-28 NOTE — Progress Notes (Signed)
HPI: [name] returns a month after being seen for thumb pain on the left side.  She was placed on a Medrol Dosepak due to the fact that she was having severe pain such that it was difficult to examine her left hand.  She states the Medrol Dosepak gave her minimal relief.  However she is having overall good day today.  She continues to have pain about the left thumb.  Occasional numbness tingling.  She reports also occasional burning pain down her left arm this happens about twice a month.  She states this is been ongoing since having rotator cuff surgery on the left.  She denies any numbness in the remainder of the hand outside of the thumb.  She has a remote history of neck pain but is having none currently.  Patient is right-hand dominant. ? ? ?Review of systems see HPI otherwise negative ? ?Physical exam: Bilateral hands full sensation subjectively throughout to light touch.  Full motor bilateral hands.  No rashes skin lesions ulcerations erythema or ecchymosis either hand.  Positive grind test left thumb.  Finkelstein's positive also on the left.  No significant tenderness over the first extensor compartment.  Maximal tenderness over the [facility] joint. ? ? ?Impression: Left thumb pain ?Left thumb CMC joint arthritis ? ?Plan given today's exam I do feel that most of the pain is coming from her [facility] joint.  Recommend follow-up with Dr. Tempie Huffman for examination and treatment.  Questions were encouraged and answered at length. ?

## 2021-12-02 ENCOUNTER — Encounter: Payer: Self-pay | Admitting: Orthopedic Surgery

## 2021-12-02 ENCOUNTER — Ambulatory Visit (INDEPENDENT_AMBULATORY_CARE_PROVIDER_SITE_OTHER): Payer: BC Managed Care – PPO | Admitting: Orthopedic Surgery

## 2021-12-02 DIAGNOSIS — M1812 Unilateral primary osteoarthritis of first carpometacarpal joint, left hand: Secondary | ICD-10-CM

## 2021-12-02 MED ORDER — DICLOFENAC SODIUM 75 MG PO TBEC: 75.0000 mg | DELAYED_RELEASE_TABLET | Freq: Two times a day (BID) | ORAL | 0 refills | Status: AC

## 2021-12-02 NOTE — Progress Notes (Signed)
? ?Office Visit Note ?  ?Patient: [name]           ?Date of Birth: [birth year]           ?MRN: [id number] ?Visit Date: 12/02/2021 ?             ?Requested by: Carollee Herter, Kendrick Fries R, DO ?[address] ?[address] ?[place],  [place] [address] ?PCP: Ann Held, DO ? ? ?Assessment & Plan: ?Visit Diagnoses:  ?1. Arthritis of carpometacarpal (CMC) joint of left thumb   ? ? ?Plan: We discussed the diagnosis, prognosis, non-operative and operative treatment options for thumb cmc osteoarthritis.  We also reviewed her x-rays which demonstrate Eaton stage III disease.  ? ?After our discussion, the patient would like to proceed with an oral anti-inflammatory medication and continued bracing.  She has a friend with multiple different thumb CMC braces and will pick one that works best for her.  We reviewed the risks and benefits of conservative management.  The patient expressed understanding of the reasoning and strategy going forward. ? ?All patient questions and concerns were addressed.  ? ? ?Follow-Up Instructions: No follow-ups on file.  ? ?Orders:  ?No orders of the defined types were placed in this encounter. ? ?Meds ordered this encounter  ?Medications  ? diclofenac (VOLTAREN) 75 MG EC tablet  ?  Sig: Take 1 tablet (75 mg total) by mouth 2 (two) times daily.  ?  Dispense:  60 tablet  ?  Refill:  0  ? ? ? ? Procedures: ?No procedures performed ? ? ?Clinical Data: ?No additional findings. ? ? ?Subjective: ?Chief Complaint  ?Patient presents with  ? Left Thumb - Follow-up  ? ? ?This is a 61 year old right-hand-dominant female who presents with pain at the base of the left thumb.  This been going on for at least 3 months.  She denies any injury.  Her pain is localized to the base of the thumb and is worse with certain activities.  The example she gives is wringing out a rag. The pain occasionally radiates both proximally to the radial styloid and distally to the MP joint.   She describes worse pain in this area  with cold weather.  She has been taking oral ibuprofen as needed.  She was given an over-the-counter thumb spica brace which she says is too big and bulky.  She has a friend left thumb CMC arthritis who has a collection of braces.  She is planning on picking one from her friend.  She denies pain elsewhere in the hand. ? ? ?Review of Systems ? ? ?Objective: ?Vital Signs: There were no vitals taken for this visit. ? ?Physical Exam ?Constitutional:   ?   Appearance: Normal appearance.  ?Cardiovascular:  ?   Rate and Rhythm: Normal rate.  ?   Pulses: Normal pulses.  ?Skin: ?   General: Skin is warm and dry.  ?   Capillary Refill: Capillary refill takes less than 2 seconds.  ?Neurological:  ?   Mental Status: She is alert.  ? ? ?Left Hand Exam  ? ?Tenderness  ?Left hand tenderness location: TTP at base of thumb at [facility] joint w/ moderate localized swelling.  ? ?Other  ?Erythema: absent ?Sensation: normal ?Pulse: present ? ?Comments:  Pain w/ CMC grind test w/out crepitus but with patient guarding.  No pain w/ palpation at MP joint w/ no static or dynamic MP hyper-extension.  No palmar abduction contracture. No TTP at radial styloid  w/ negative Finkelstein test.  No thumb triggering.  ? ? ? ? ?Specialty Comments:  ?No specialty comments available. ? ?Imaging: ?No results found. ? ? ?PMFS History: ?Patient Active Problem List  ? Diagnosis Date Noted  ? Arthritis of carpometacarpal [facility]) joint of left thumb 12/02/2021  ? Lumbar radiculopathy 05/29/2017  ? Greater trochanteric bursitis of left hip 05/29/2017  ? Contusion, hip and thigh, left, initial encounter 05/29/2017  ? Left hip pain 04/30/2017  ? Abdominal pain, LLQ 04/30/2017  ? Back pain 02/16/2015  ? Obesity (BMI 30-39.9) 06/09/2013  ? OSTEOARTHRITIS 12/15/2009  ? IMPAIRMENT, ONE EYE MODERATE, OTHER NOS 07/01/2007  ? SBO 07/01/2007  ? HERNIA, UMBILICAL 74/04/1447  ? ?Past Medical History:  ?Diagnosis Date  ? 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  ?Family History  ?Problem  Relation Age of Onset  ? Heart disease Mother 16  ?     MI  ? Stroke Mother   ? Hypertension Mother   ? Arthritis Mother   ? Pulmonary embolism Mother   ? Prostate cancer Father   ? Hypertension Father   ? Arthritis Sister   ? Colon cancer Neg Hx   ? Colon polyps Neg Hx   ? Esophageal cancer Neg Hx   ? Rectal cancer Neg Hx   ? Stomach cancer Neg Hx   ?  ?Past Surgical History:  ?Procedure Laterality Date  ? ABDOMINAL HYSTERECTOMY  2001  ? TAH/BSO fibroids  ? CATARACT EXTRACTION Right   ? COLONOSCOPY  04/27/2011  ? obstructed bile duct  2004  ? Dr Sharmaine Base  ? OOPHORECTOMY    ? BSO  ? ROTATOR CUFF REPAIR Bilateral 12/2010  ? Left and right  ? ?Social History  ? ?Occupational History  ? Occupation: MEAT CUTTER  ?  Employer: [employer]  ?Tobacco Use  ? Smoking status: Never  ? Smokeless tobacco: Never  ?Vaping Use  ? Vaping Use: Never used  ?Substance and Sexual Activity  ? Alcohol use: Yes  ?  Alcohol/week: 0.0 standard drinks  ?  Comment: Occas. wine  ? Drug use: No  ? Sexual activity: Not Currently  ?  Partners: Male  ?  Birth control/protection: Surgical  ?  Comment: 1st intercourse 61 yo-More than 5 partners,hysterectomy  ? ? ? ? ? ? ?

## 2021-12-02 NOTE — Addendum Note (Signed)
Addended by: Sherilyn Cooter on: 12/02/2021 10:13 AM ? ? Modules accepted: Orders ? ?

## 2021-12-07 ENCOUNTER — Ambulatory Visit (INDEPENDENT_AMBULATORY_CARE_PROVIDER_SITE_OTHER): Payer: BC Managed Care – PPO | Admitting: Orthopaedic Surgery

## 2021-12-07 ENCOUNTER — Ambulatory Visit (HOSPITAL_BASED_OUTPATIENT_CLINIC_OR_DEPARTMENT_OTHER)
Admission: RE | Admit: 2021-12-07 | Discharge: 2021-12-07 | Disposition: A | Discharge status: Home / Self Care | Payer: BC Managed Care – PPO | Source: Ambulatory Visit | Attending: Orthopaedic Surgery | Admitting: Orthopaedic Surgery

## 2021-12-07 ENCOUNTER — Other Ambulatory Visit (HOSPITAL_BASED_OUTPATIENT_CLINIC_OR_DEPARTMENT_OTHER): Payer: Self-pay | Admitting: Orthopaedic Surgery

## 2021-12-07 DIAGNOSIS — G8929 Other chronic pain: Secondary | ICD-10-CM | POA: Diagnosis not present

## 2021-12-07 DIAGNOSIS — M7582 Other shoulder lesions, left shoulder: Secondary | ICD-10-CM | POA: Diagnosis not present

## 2021-12-07 DIAGNOSIS — M25512 Pain in left shoulder: Secondary | ICD-10-CM | POA: Diagnosis not present

## 2021-12-07 MED ORDER — TRAMADOL HCL 50 MG PO TABS: 50.0000 mg | ORAL_TABLET | Freq: Four times a day (QID) | ORAL | 0 refills | Status: DC | PRN

## 2021-12-07 NOTE — Addendum Note (Signed)
Addended by: Yevonne Pax on: 12/07/2021 12:11 PM ? ? Modules accepted: Orders ? ?

## 2021-12-07 NOTE — Progress Notes (Signed)
? ?                            ? ? ?Chief Complaint: Left shoulder pain ?  ? ? ?History of Present Illness:  ? ? ?[name] is a 61 y.o. female right-hand-dominant presents today with ongoing left shoulder pain that has been significant for the last several months.  She states that she did have a previous rotator cuff repair done at [facility] 6 years prior.  Since that time she was doing well until several months ago.  Since that time she has had a hard time sleeping and using the arm overhead.  She does work at [employer] in [place] and is really not able to lift overhead with any type of significant strength.  She denies any specific injury or accident.  She does state that the shoulder feels a lot like it did when she had previously torn her rotator cuff.  She states that she is experiencing cramping and anterior based shoulder pain that goes down the arm and down the biceps.  She has taken anti-inflammatories for pain.  She has not had any recent injections or therapy. ? ? ? ?Surgical History:   ?Left shoulder rotator cuff repair 6 years prior at [facility] ? ?PMH/PSH/Family History/Social History/Meds/Allergies:   ? ?Past Medical History:  ?Diagnosis Date  ? 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 ?Past Surgical History:  ?Procedure Laterality Date  ? ABDOMINAL HYSTERECTOMY  2001  ? TAH/BSO fibroids  ? CATARACT EXTRACTION Right   ? COLONOSCOPY  04/27/2011  ? obstructed bile duct  2004  ? Dr Sharmaine Base  ? OOPHORECTOMY    ? BSO  ? ROTATOR CUFF REPAIR Bilateral 12/2010  ? Left and right  ? ?Social History  ? ?Socioeconomic History  ? Marital status: Divorced  ?  Spouse name: Not on file  ? Number of children: 1  ? Years of education: Not on file  ? Highest education level: Not on file  ?Occupational History  ? Occupation: MEAT CUTTER  ?  Employer: [employer]  ?Tobacco Use  ? Smoking status: Never  ? Smokeless tobacco: Never  ?Vaping Use  ? Vaping Use: Never used  ?Substance and Sexual  Activity  ? Alcohol use: Yes  ?  Alcohol/week: 0.0 standard drinks  ?  Comment: Occas. wine  ? Drug use: No  ? Sexual activity: Not Currently  ?  Partners: Male  ?  Birth control/protection: Surgical  ?  Comment: 1st intercourse 61 yo-More than 5 partners,hysterectomy  ?Other Topics Concern  ? Not on file  ?Social History Narrative  ? Not on file  ? ?Social Determinants of Health  ? ?Financial Resource Strain: Not on file  ?Food Insecurity: Not on file  ?Transportation Needs: Not on file  ?Physical Activity: Not on file  ?Stress: Not on file  ?Social Connections: Not on file  ? ?Family History  ?Problem Relation Age of Onset  ? Heart disease Mother 32  ?     MI  ? Stroke Mother   ? Hypertension Mother   ? Arthritis Mother   ? Pulmonary embolism Mother   ? Prostate cancer Father   ? Hypertension Father   ? Arthritis Sister   ? Colon cancer Neg Hx   ? Colon polyps Neg Hx   ? Esophageal cancer Neg Hx   ? Rectal cancer Neg Hx   ?  Stomach cancer Neg Hx   ? ?No Known Allergies ?Current Outpatient Medications  ?Medication Sig Dispense Refill  ? Acetaminophen (TYLENOL ARTHRITIS PAIN PO) Take by mouth.    ? Cholecalciferol (VITAMIN D PO) Take by mouth.    ? diclofenac (VOLTAREN) 75 MG EC tablet Take 1 tablet (75 mg total) by mouth 2 (two) times daily. 60 tablet 0  ? methylPREDNISolone (MEDROL) 4 MG tablet Take as directed 21 tablet 0  ? Multiple Vitamin (MULTIVITAMIN) tablet Take 1 tablet by mouth daily.    ? oxybutynin (DITROPAN-XL) 5 MG 24 hr tablet Take 5 mg by mouth daily.    ? ?No current facility-administered medications for this visit.  ? ?No results found. ? ?Review of Systems:   ?A ROS was performed including pertinent positives and negatives as documented in the HPI. ? ?Physical Exam :   ?Constitutional: NAD and appears stated age ?Neurological: Alert and oriented ?Psych: Appropriate affect and cooperative ?There were no vitals taken for this visit.  ? ?Comprehensive Musculoskeletal Exam:   ? ?Musculoskeletal Exam     ?Inspection Right Left  ?Skin No atrophy or winging No atrophy or winging  ?Palpation    ?Tenderness None Lateral deltoid, bicipital groove  ?Range of Motion    ?Flexion (passive) 170 170  ?Flexion (active) 170 170  ?Abduction 170 170  ?ER at the side 70 70  ?Can reach behind back to T12 T12  ?Strength    ? Full 4/5 supra, infraspinatus as well as a positive belly press  ?Special Tests    ?Pseudoparalytic No No  ?Neurologic    ?Fires PIN, radial, median, ulnar, musculocutaneous, axillary, suprascapular, long thoracic, and spinal accessory innervated muscles. No abnormal sensibility  ?Vascular/Lymphatic    ?Radial Pulse 2+ 2+  ?Cervical Exam    ?Patient has symmetric cervical range of motion with negative Spurling's test.  ?Special Test: Positive Neer impingement with weakness  ? ? ? ?Imaging:   ?Xray (3 views left shoulder): ?There is evidence of previous distal clavicle resection.  Otherwise well-maintained glenohumeral joint space ? ? ?I personally reviewed and interpreted the radiographs. ? ? ?Assessment:   ?61 y.o. female presents with left shoulder pain in the setting of a known rotator cuff repair 6 years prior.  Given the fact that she now has pain I discussed that I would be specifically concerned about a possible retear of her tendon.  Given the fact that she has previously had surgical repair I do believe that an MRI is indicated in order to rule out a retear of her previous repair.  I believe that ultimately this would not allow me to better advise whether or not physical therapy would be indicated for strengthening versus a discussion of additional intervention.  We will plan for an MRI of her shoulder and I will see her back following that to discuss results ? ?Plan :   ? ?-Plan for MRI left shoulder ? ? ? ? ? ? ?I personally saw and evaluated the patient, and participated in the management and treatment plan. ? ?Vanetta Mulders, MD ?Attending Physician, Orthopedic Surgery ? ?This document was dictated  using Systems analyst. A reasonable attempt at proof reading has been made to minimize errors. ?

## 2021-12-12 ENCOUNTER — Telehealth: Payer: Self-pay | Admitting: Orthopaedic Surgery

## 2021-12-12 NOTE — Telephone Encounter (Signed)
Patient has expressed concerns with getting MRI due to being claustrophobic. She is requesting something to help with her anxiety before her MRI appt on 4/15. ?

## 2021-12-13 ENCOUNTER — Other Ambulatory Visit (HOSPITAL_BASED_OUTPATIENT_CLINIC_OR_DEPARTMENT_OTHER): Payer: Self-pay | Admitting: Orthopaedic Surgery

## 2021-12-13 MED ORDER — LORAZEPAM 1 MG PO TABS: 1.0000 mg | ORAL_TABLET | Freq: Three times a day (TID) | ORAL | 0 refills | Status: DC

## 2021-12-17 ENCOUNTER — Ambulatory Visit
Admission: RE | Admit: 2021-12-17 | Discharge: 2021-12-17 | Disposition: A | Discharge status: Home / Self Care | Payer: BC Managed Care – PPO | Source: Ambulatory Visit | Attending: Orthopaedic Surgery | Admitting: Orthopaedic Surgery

## 2021-12-17 DIAGNOSIS — G8929 Other chronic pain: Secondary | ICD-10-CM

## 2021-12-17 DIAGNOSIS — D1779 Benign lipomatous neoplasm of other sites: Secondary | ICD-10-CM | POA: Diagnosis not present

## 2021-12-17 DIAGNOSIS — M19012 Primary osteoarthritis, left shoulder: Secondary | ICD-10-CM | POA: Diagnosis not present

## 2021-12-17 DIAGNOSIS — Z9889 Other specified postprocedural states: Secondary | ICD-10-CM | POA: Diagnosis not present

## 2021-12-17 DIAGNOSIS — M62512 Muscle wasting and atrophy, not elsewhere classified, left shoulder: Secondary | ICD-10-CM | POA: Diagnosis not present

## 2021-12-19 ENCOUNTER — Ambulatory Visit (HOSPITAL_BASED_OUTPATIENT_CLINIC_OR_DEPARTMENT_OTHER): Payer: Self-pay | Admitting: Orthopaedic Surgery

## 2021-12-19 ENCOUNTER — Other Ambulatory Visit (HOSPITAL_BASED_OUTPATIENT_CLINIC_OR_DEPARTMENT_OTHER): Payer: Self-pay

## 2021-12-19 ENCOUNTER — Ambulatory Visit (INDEPENDENT_AMBULATORY_CARE_PROVIDER_SITE_OTHER): Payer: BC Managed Care – PPO | Admitting: Orthopaedic Surgery

## 2021-12-19 DIAGNOSIS — M12812 Other specific arthropathies, not elsewhere classified, left shoulder: Secondary | ICD-10-CM

## 2021-12-19 MED ORDER — OXYCODONE HCL 5 MG PO TABS
5.0000 mg | ORAL_TABLET | ORAL | 0 refills | Status: DC | PRN
  Filled 2021-12-19: qty 20, 4d supply, fill #0

## 2021-12-19 MED ORDER — ASPIRIN EC 325 MG PO TBEC
325.0000 mg | DELAYED_RELEASE_TABLET | Freq: Every day | ORAL | 0 refills | Status: DC
  Filled 2021-12-19: qty 30, 30d supply, fill #0

## 2021-12-19 MED ORDER — ACETAMINOPHEN 500 MG PO TABS
500.0000 mg | ORAL_TABLET | Freq: Three times a day (TID) | ORAL | 0 refills | Status: AC
  Filled 2021-12-19: qty 30, 10d supply, fill #0

## 2021-12-19 MED ORDER — IBUPROFEN 800 MG PO TABS
800.0000 mg | ORAL_TABLET | Freq: Three times a day (TID) | ORAL | 0 refills | Status: AC
  Filled 2021-12-19: qty 30, 10d supply, fill #0

## 2021-12-19 NOTE — Progress Notes (Signed)
? ?                            ? ? ?Chief Complaint: Left shoulder pain ?  ? ? ?History of Present Illness:  ? ?12/19/2021: Presents today for MRI discussion of the left shoulder.  [name] has persistent pain in her left shoulder following rotator cuff repair done 6 years prior at an outside practice.  She states for the last 6 months she has had persistent shoulder pain and disability.  She is having extremely hard time laying on the side.  She is not able to lift overhead to perform the basics of her job working at the [employer] in [place].  She has been working on her exercise program which she was instructed on following her initial rotator cuff repair for the last several months with no improvement in her symptoms.  At today's visit pain and nighttime awakenings are her predominant complaint.  Although she is also quite bothered by her increasing inability to perform her job. ? ?[name] is a 61 y.o. female right-hand-dominant presents today with ongoing left shoulder pain that has been significant for the last several months.  She states that she did have a previous rotator cuff repair done at [facility] 6 years prior.  Since that time she was doing well until several months ago.  Since that time she has had a hard time sleeping and using the arm overhead.  She does work at [employer] in [place] and is really not able to lift overhead with any type of significant strength.  She denies any specific injury or accident.  She does state that the shoulder feels a lot like it did when she had previously torn her rotator cuff.  She states that she is experiencing cramping and anterior based shoulder pain that goes down the arm and down the biceps.  She has taken anti-inflammatories for pain.  She has not had any recent injections or therapy. ? ? ? ?Surgical History:   ?Left shoulder rotator cuff repair 6 years prior at [facility] ? ?PMH/PSH/Family History/Social  History/Meds/Allergies:   ? ?Past Medical History:  ?Diagnosis Date  ? 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 ?Past Surgical History:  ?Procedure Laterality Date  ? ABDOMINAL HYSTERECTOMY  2001  ? TAH/BSO fibroids  ? CATARACT EXTRACTION Right   ? COLONOSCOPY  04/27/2011  ? obstructed bile duct  2004  ? Dr Sharmaine Base  ? OOPHORECTOMY    ? BSO  ? ROTATOR CUFF REPAIR Bilateral 12/2010  ? Left and right  ? ?Social History  ? ?Socioeconomic History  ? Marital status: Divorced  ?  Spouse name: Not on file  ? Number of children: 1  ? Years of education: Not on file  ? Highest education level: Not on file  ?Occupational History  ? Occupation: MEAT CUTTER  ?  Employer: [employer]  ?Tobacco Use  ? Smoking status: Never  ? Smokeless tobacco: Never  ?Vaping Use  ? Vaping Use: Never used  ?Substance and Sexual Activity  ? Alcohol use: Yes  ?  Alcohol/week: 0.0 standard drinks  ?  Comment: Occas. wine  ? Drug use: No  ? Sexual activity: Not Currently  ?  Partners: Male  ?  Birth control/protection: Surgical  ?  Comment: 1st intercourse 61 yo-More than 5 partners,hysterectomy  ?Other Topics Concern  ? Not on file  ?Social  History Narrative  ? Not on file  ? ?Social Determinants of Health  ? ?Financial Resource Strain: Not on file  ?Food Insecurity: Not on file  ?Transportation Needs: Not on file  ?Physical Activity: Not on file  ?Stress: Not on file  ?Social Connections: Not on file  ? ?Family History  ?Problem Relation Age of Onset  ? Heart disease Mother 12  ?     MI  ? Stroke Mother   ? Hypertension Mother   ? Arthritis Mother   ? Pulmonary embolism Mother   ? Prostate cancer Father   ? Hypertension Father   ? Arthritis Sister   ? Colon cancer Neg Hx   ? Colon polyps Neg Hx   ? Esophageal cancer Neg Hx   ? Rectal cancer Neg Hx   ? Stomach cancer Neg Hx   ? ?No Known Allergies ?Current Outpatient Medications  ?Medication Sig Dispense Refill  ? acetaminophen (TYLENOL) 500 MG tablet Take 1 tablet (500 mg total) by mouth every 8  (eight) hours for 10 days. 30 tablet 0  ? aspirin EC 325 MG tablet Take 1 tablet (325 mg total) by mouth daily. 30 tablet 0  ? ibuprofen (ADVIL) 800 MG tablet Take 1 tablet (800 mg total) by mouth every 8 (eight) hours for 10 days. Please take with food, please alternate with acetaminophen 30 tablet 0  ? oxyCODONE (OXY IR/ROXICODONE) 5 MG immediate release tablet Take 1 tablet (5 mg total) by mouth every 4 (four) hours as needed (severe pain). 20 tablet 0  ? Acetaminophen (TYLENOL ARTHRITIS PAIN PO) Take by mouth.    ? Cholecalciferol (VITAMIN D PO) Take by mouth.    ? diclofenac (VOLTAREN) 75 MG EC tablet Take 1 tablet (75 mg total) by mouth 2 (two) times daily. 60 tablet 0  ? LORazepam (ATIVAN) 1 MG tablet Take 1 tablet (1 mg total) by mouth every 8 (eight) hours. 2 tablet 0  ? methylPREDNISolone (MEDROL) 4 MG tablet Take as directed 21 tablet 0  ? Multiple Vitamin (MULTIVITAMIN) tablet Take 1 tablet by mouth daily.    ? oxybutynin (DITROPAN-XL) 5 MG 24 hr tablet Take 5 mg by mouth daily.    ? traMADol (ULTRAM) 50 MG tablet Take 1 tablet (50 mg total) by mouth every 6 (six) hours as needed. 30 tablet 0  ? ?No current facility-administered medications for this visit.  ? ?MR Shoulder Left Wo Contrast ? ?Result Date: 12/18/2021 ?CLINICAL DATA:  Shoulder pain. Rotator cuff disorder suspected. Chronic. EXAM: MRI OF THE LEFT SHOULDER WITHOUT CONTRAST TECHNIQUE: Multiplanar, multisequence MR imaging of the shoulder was performed. No intravenous contrast was administered. COMPARISON:  Left shoulder radiographs 12/07/2021 FINDINGS: Rotator cuff: There are is a screw anchor within the humeral head greater tuberosity in the region of the posterior supraspinatus and anterior infraspinatus tendon footprint insertions, likely from prior rotator cuff repair. There is mild intermediate T2 signal within the adjacent tendons coronal presumably postsurgical change. There is also scattered postsurgical metallic susceptibility artifact  in this region. No fluid bright tear. The subscapularis and teres minor are intact. Muscles: Mild anterior supraspinatus muscle atrophy with mild supraspinatus and infraspinatus diffuse feathery fatty infiltration. There is a small fat density likely intramuscular lipoma within the teres major muscle measuring up to approximately 12 x 5 x 21 mm (craniocaudal by AP by transverse shoulder dimensions; sagittal series 11, image 6 and coronal series 9 image 10). Biceps long head: The long head of the biceps tendon is not  visualized proximal to the bicipital groove or within the proximal aspect of the bicipital groove, likely from prior proximal tendon rupture versus postsurgical change (tenotomy versus tenodesis). Acromioclavicular Joint: There appear to be postsurgical changes of prior acromioplasty and distal clavicle partial excision. No subacromial/subdeltoid bursitis. Glenohumeral Joint: Moderate thinning of the glenoid and humeral head cartilage. Moderate anterior superior glenoid subchondral degenerative cystic change. Labrum: Lack of intra-articular fluid limits evaluation of the glenoid labrum. There appears to be mild attenuation of the posterosuperior glenoid labrum with mild surrounding fluid likely at least degenerative change. Bones: Mild inferior glenoid and humeral head-neck junction degenerative osteophytes. Other: None. IMPRESSION:: IMPRESSION: 1. Prior posterior supraspinatus and anterior infraspinatus rotator cuff repair. No fluid bright rotator cuff tear is seen. 2. Mild anterior supraspinatus muscle atrophy and mild supraspinatus and infraspinatus fatty infiltration. 3. 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 4. The long head of the biceps tendon is not visualized until the mid aspect of the bicipital groove likely from prior proximal tendon rupture versus postsurgical change (tenotomy versus tenodesis). 5. Postsurgical changes of prior acromioplasty and distal clavicle partial  excision. 6. Moderate glenohumeral cartilage degenerative changes. Electronically Signed   By: Yvonne Kendall M.D.   On: 12/18/2021 23:16   ? ?Review of Systems:   ?A ROS was performed including pertinent positives and negativ

## 2021-12-26 ENCOUNTER — Ambulatory Visit (HOSPITAL_BASED_OUTPATIENT_CLINIC_OR_DEPARTMENT_OTHER)
Admission: RE | Admit: 2021-12-26 | Discharge: 2021-12-26 | Disposition: A | Discharge status: Home / Self Care | Payer: BC Managed Care – PPO | Source: Ambulatory Visit | Attending: Orthopaedic Surgery | Admitting: Orthopaedic Surgery

## 2021-12-26 DIAGNOSIS — M12812 Other specific arthropathies, not elsewhere classified, left shoulder: Secondary | ICD-10-CM | POA: Insufficient documentation

## 2021-12-26 DIAGNOSIS — M19012 Primary osteoarthritis, left shoulder: Secondary | ICD-10-CM | POA: Diagnosis not present

## 2021-12-28 ENCOUNTER — Emergency Department (HOSPITAL_COMMUNITY): Payer: BC Managed Care – PPO

## 2021-12-28 ENCOUNTER — Encounter (HOSPITAL_COMMUNITY): Payer: Self-pay

## 2021-12-28 ENCOUNTER — Emergency Department (HOSPITAL_COMMUNITY)
Admission: EM | Admit: 2021-12-28 | Discharge: 2021-12-28 | Disposition: A | Discharge status: Home / Self Care | Payer: BC Managed Care – PPO | Attending: Student | Admitting: Student

## 2021-12-28 DIAGNOSIS — M25511 Pain in right shoulder: Secondary | ICD-10-CM | POA: Insufficient documentation

## 2021-12-28 DIAGNOSIS — R0902 Hypoxemia: Secondary | ICD-10-CM | POA: Diagnosis not present

## 2021-12-28 DIAGNOSIS — S161XXA Strain of muscle, fascia and tendon at neck level, initial encounter: Secondary | ICD-10-CM | POA: Diagnosis not present

## 2021-12-28 DIAGNOSIS — R0789 Other chest pain: Secondary | ICD-10-CM | POA: Diagnosis not present

## 2021-12-28 DIAGNOSIS — S199XXA Unspecified injury of neck, initial encounter: Secondary | ICD-10-CM | POA: Diagnosis not present

## 2021-12-28 DIAGNOSIS — M25572 Pain in left ankle and joints of left foot: Secondary | ICD-10-CM | POA: Diagnosis not present

## 2021-12-28 DIAGNOSIS — M542 Cervicalgia: Secondary | ICD-10-CM | POA: Diagnosis not present

## 2021-12-28 DIAGNOSIS — Y92410 Unspecified street and highway as the place of occurrence of the external cause: Secondary | ICD-10-CM | POA: Diagnosis not present

## 2021-12-28 DIAGNOSIS — R519 Headache, unspecified: Secondary | ICD-10-CM | POA: Diagnosis not present

## 2021-12-28 DIAGNOSIS — S99912A Unspecified injury of left ankle, initial encounter: Secondary | ICD-10-CM | POA: Diagnosis not present

## 2021-12-28 DIAGNOSIS — Z7982 Long term (current) use of aspirin: Secondary | ICD-10-CM | POA: Insufficient documentation

## 2021-12-28 DIAGNOSIS — R079 Chest pain, unspecified: Secondary | ICD-10-CM | POA: Insufficient documentation

## 2021-12-28 DIAGNOSIS — G4489 Other headache syndrome: Secondary | ICD-10-CM | POA: Diagnosis not present

## 2021-12-28 MED ORDER — MORPHINE SULFATE (PF) 4 MG/ML IV SOLN
4.0000 mg | Freq: Once | INTRAVENOUS | Status: DC
  Filled 2021-12-28: qty 1

## 2021-12-28 MED ORDER — KETOROLAC TROMETHAMINE 15 MG/ML IJ SOLN
15.0000 mg | Freq: Once | INTRAMUSCULAR | Status: AC
  Administered 2021-12-28: 15 mg via INTRAMUSCULAR

## 2021-12-28 MED ORDER — KETOROLAC TROMETHAMINE 15 MG/ML IJ SOLN
15.0000 mg | Freq: Once | INTRAMUSCULAR | Status: DC
Start: 2021-12-28 — End: 2021-12-28
  Filled 2021-12-28: qty 1

## 2021-12-28 MED ORDER — METHOCARBAMOL 500 MG PO TABS: 500.0000 mg | ORAL_TABLET | Freq: Two times a day (BID) | ORAL | 0 refills | Status: DC

## 2021-12-28 MED ORDER — MORPHINE SULFATE (PF) 4 MG/ML IV SOLN
4.0000 mg | Freq: Once | INTRAVENOUS | Status: AC
  Administered 2021-12-28: 4 mg via INTRAMUSCULAR

## 2021-12-28 NOTE — ED Notes (Signed)
Placed C-collar on pt.

## 2021-12-28 NOTE — ED Notes (Signed)
I provided reinforced discharge education based off of discharge instructions. Pt acknowledged and understood my education. Pt had no further questions/concerns for provider/myself.  °

## 2021-12-28 NOTE — ED Notes (Signed)
Pt transported to CT ?
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
## 2021-12-30 ENCOUNTER — Encounter: Payer: Self-pay | Admitting: Family Medicine

## 2021-12-30 ENCOUNTER — Ambulatory Visit (INDEPENDENT_AMBULATORY_CARE_PROVIDER_SITE_OTHER): Payer: BC Managed Care – PPO | Admitting: Family Medicine

## 2021-12-30 VITALS — BP 138/88 | HR 84 | Temp 97.6°F | Resp 18 | Ht 65.0 in | Wt 183.2 lb

## 2021-12-30 DIAGNOSIS — Z01818 Encounter for other preprocedural examination: Secondary | ICD-10-CM | POA: Diagnosis not present

## 2021-12-30 LAB — LIPID PANEL
Cholesterol: 261 mg/dL — ABNORMAL HIGH (ref 0–200)
HDL: 87.6 mg/dL (ref >39.00)
LDL Cholesterol: 151 mg/dL — ABNORMAL HIGH (ref 0–99)
NonHDL: 173.67
Total CHOL/HDL Ratio: 3
Triglycerides: 112 mg/dL (ref 0.0–149.0)
VLDL: 22.4 mg/dL (ref 0.0–40.0)

## 2021-12-30 LAB — CBC WITH DIFFERENTIAL/PLATELET
Basophils Absolute: 0 10*3/uL (ref 0.0–0.1)
Basophils Relative: 0.8 % (ref 0.0–3.0)
Eosinophils Absolute: 0 10*3/uL (ref 0.0–0.7)
Eosinophils Relative: 0.3 % (ref 0.0–5.0)
HCT: 40 % (ref 36.0–46.0)
Hemoglobin: 12.9 g/dL (ref 12.0–15.0)
Lymphocytes Relative: 42.2 % (ref 12.0–46.0)
Lymphs Abs: 1.7 10*3/uL (ref 0.7–4.0)
MCHC: 32.3 g/dL (ref 30.0–36.0)
MCV: 91.2 fl (ref 78.0–100.0)
Monocytes Absolute: 0.3 10*3/uL (ref 0.1–1.0)
Monocytes Relative: 8.1 % (ref 3.0–12.0)
Neutro Abs: 1.9 10*3/uL (ref 1.4–7.7)
Neutrophils Relative %: 48.6 % (ref 43.0–77.0)
Platelets: 187 10*3/uL (ref 150.0–400.0)
RBC: 4.39 Mil/uL (ref 3.87–5.11)
RDW: 14 % (ref 11.5–15.5)
WBC: 3.9 10*3/uL — ABNORMAL LOW (ref 4.0–10.5)

## 2021-12-30 LAB — PROTIME-INR
INR: 1 ratio (ref 0.8–1.0)
Prothrombin Time: 11.5 s (ref 9.6–13.1)

## 2021-12-30 LAB — COMPREHENSIVE METABOLIC PANEL
ALT: 10 U/L (ref 0–35)
AST: 18 U/L (ref 0–37)
Albumin: 4.4 g/dL (ref 3.5–5.2)
Alkaline Phosphatase: 83 U/L (ref 39–117)
BUN: 15 mg/dL (ref 6–23)
CO2: 28 mEq/L (ref 19–32)
Calcium: 9.2 mg/dL (ref 8.4–10.5)
Chloride: 103 mEq/L (ref 96–112)
Creatinine, Ser: 0.63 mg/dL (ref 0.40–1.20)
GFR: 95.42 mL/min (ref >60.00)
Glucose, Bld: 96 mg/dL (ref 70–99)
Potassium: 3.9 mEq/L (ref 3.5–5.1)
Sodium: 139 mEq/L (ref 135–145)
Total Bilirubin: 0.7 mg/dL (ref 0.2–1.2)
Total Protein: 7.1 g/dL (ref 6.0–8.3)

## 2021-12-30 LAB — APTT: aPTT: 31.9 s (ref 23.4–32.7)

## 2021-12-30 LAB — TSH: TSH: 0.72 u[IU]/mL (ref 0.35–5.50)

## 2021-12-30 NOTE — Patient Instructions (Signed)
Shoulder Arthroscopy, Care After The following information offers guidance on how to care for yourself after your procedure. Your health care provider may also give you more specific instructions. If you have problems or questions, contact your health care provider. What can I expect after the procedure? After the procedure, it is common to have: Pain. Swelling. A small amount of fluid from the incision. Stiffness that improves over time. Follow these instructions at home: If you have a sling or an immobilizer: Wear it as told by your health care provider. Remove it only as told by your health care provider. These devices protect your shoulder and help it heal by keeping it in place. Check the skin around it every day. Tell your health care provider about any concerns. Loosen it if your fingers tingle, become numb, or turn cold and blue. Keep the sling or immobilizer clean. If it is not waterproof: Do not let it get wet. Cover it with a watertight covering when you take a bath or a shower. Incision care  Follow instructions from your health care provider about how to take care of your incisions. Make sure you: Wash your hands with soap and water for at least 20 seconds before and after you change your bandage (dressing). If soap and water are not available, use hand sanitizer. Change your dressing as told by your health care provider. Leave stitches (sutures), skin glue, or adhesive strips in place. These skin closures may need to stay in place for 2 weeks or longer. If adhesive strip edges start to loosen and curl up, you may trim the loose edges. Do not remove adhesive strips completely unless your health care provider tells you to do that. Check your incision areas every day for signs of infection. Check for: Redness. More swelling or pain. Blood or more fluid. Warmth. Pus or a bad smell. Do not take baths, swim, or use a hot tub until your health care provider approves. Ask your  health care provider if you may take showers. You may only be allowed to take sponge baths. Managing pain, stiffness, and swelling  If directed, put ice on the affected area. To do this: If you have a removable sling or immobilizer, remove it as told by your health care provider. Put ice in a plastic bag or use the icing device (cold therapy unit) that you were given. Follow instructions from your health care provider about how to use the icing device. Place a towel between your skin and the bag or between your skin and the icing device. Leave the ice on for 20 minutes, 2-3 times a day. Remove the ice if your skin turns bright red. This is very important. If you cannot feel pain, heat, or cold, you have a greater risk of damage to the area. Move your fingers often to reduce stiffness and swelling. Raise (elevate) the injured area above the level of your heart while you are lying down. It may help to sleep in a sitting position for a few days after your procedure. Try sleeping in a reclining chair or propping yourself up with extra pillows in bed. Activity Ask your health care provider what activities are safe for you during recovery. Do not lift with your affected shoulder until your health care provider approves. Avoid pulling and pushing with the arm on your affected side. If physical therapy was prescribed, do exercises as directed. Doing exercises may help to improve shoulder movement and flexibility (range of motion). Driving Ask your health   care provider when it is safe to drive if you have a sling or immobilizer. Ask your health care provider if the medicine prescribed to you requires you to avoid driving or using machinery. General instructions Take over-the-counter and prescription medicines only as told by your health care provider. Ask your health care provider if the medicine prescribed to you can cause constipation. You may need to take these actions to prevent or treat  constipation: Drink enough fluid to keep your urine pale yellow. Take over-the-counter or prescription medicines. Eat foods that are high in fiber, such as beans, whole grains, and fresh fruits and vegetables. Limit foods that are high in fat and processed sugars, such as fried or sweet foods. Do not use any products that contain nicotine or tobacco. These products include cigarettes, chewing tobacco, and vaping devices, such as e-cigarettes. These can delay incision healing after surgery. If you need help quitting, ask your health care provider. Keep all follow-up visits. This is important. Contact a health care provider if: You have a fever or chills. You have severe pain. You have any of these signs of infection: Redness around an incision. More swelling or pain in an incision area. Blood or more fluid coming from an incision. Warmth coming from an incision. Pus or a bad smell coming from an incision. You notice that an incision has opened up. You develop a rash. Get help right away if: You have difficulty breathing. You have chest pain. You notice that your fingers tingle, are numb, or are cold and blue even after you loosen your sling or immobilizer. You develop pain in your lower leg or at the back of your knee. These symptoms may represent a serious problem that is an emergency. Do not wait to see if the symptoms will go away. Get medical help right away. Call your local emergency services (911 in the [place] Do not drive yourself to the hospital. Summary If you have a sling or an immobilizer, wear it as told by your health care provider. It may help to sleep in a sitting position for a few days after your procedure. If physical therapy was prescribed, do exercises as directed. Doing exercises may help to improve shoulder movement and flexibility (range of motion). Keep all follow-up visits. This is important. This information is not intended to replace advice given to you by your  health care provider. Make sure you discuss any questions you have with your health care provider. Document Revised: 04/19/2020 Document Reviewed: 04/19/2020 Elsevier Patient Education  2023 Else[employer] Inc[employer]  
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
## 2021-12-30 NOTE — Assessment & Plan Note (Signed)
Pt cleared for surgery  

## 2022-01-02 ENCOUNTER — Telehealth: Payer: Self-pay | Admitting: Family Medicine

## 2022-01-02 DIAGNOSIS — M25552 Pain in left hip: Secondary | ICD-10-CM

## 2022-01-02 DIAGNOSIS — M1812 Unilateral primary osteoarthritis of first carpometacarpal joint, left hand: Secondary | ICD-10-CM

## 2022-01-02 DIAGNOSIS — G8929 Other chronic pain: Secondary | ICD-10-CM
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
## 2022-01-02 NOTE — Telephone Encounter (Signed)
Please advise 

## 2022-01-03 NOTE — Telephone Encounter (Signed)
REFERRAL  WAS PUT IN ?

## 2022-01-04 ENCOUNTER — Ambulatory Visit (HOSPITAL_BASED_OUTPATIENT_CLINIC_OR_DEPARTMENT_OTHER): Payer: BC Managed Care – PPO | Admitting: Orthopaedic Surgery

## 2022-01-06 ENCOUNTER — Ambulatory Visit (INDEPENDENT_AMBULATORY_CARE_PROVIDER_SITE_OTHER): Payer: Self-pay | Admitting: Sports Medicine

## 2022-01-06 VITALS — BP 120/80 | Ht 65.0 in | Wt 183.0 lb

## 2022-01-06 DIAGNOSIS — M5442 Lumbago with sciatica, left side: Secondary | ICD-10-CM

## 2022-01-06 DIAGNOSIS — G8929 Other chronic pain: Secondary | ICD-10-CM

## 2022-01-06 DIAGNOSIS — M542 Cervicalgia: Secondary | ICD-10-CM

## 2022-01-06 DIAGNOSIS — M5441 Lumbago with sciatica, right side: Secondary | ICD-10-CM

## 2022-01-06 MED ORDER — MELOXICAM 15 MG PO TABS: 15.0000 mg | ORAL_TABLET | Freq: Every day | ORAL | 0 refills | Status: DC

## 2022-01-06 MED ORDER — CYCLOBENZAPRINE HCL 5 MG PO TABS: 5.0000 mg | ORAL_TABLET | Freq: Every day | ORAL | 0 refills | Status: DC
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
## 2022-01-06 NOTE — Patient Instructions (Addendum)
Good to see you  ?- Start meloxicam 15 mg daily x2 weeks.  If still having pain after 2 weeks, complete 3rd-week of meloxicam. May use remaining meloxicam as needed once daily for pain control.  Do not to use additional NSAIDs while taking meloxicam.  May use Tylenol [phone] mg 2 to 3 times a day for breakthrough pain. ?Flexeril 5-10 mg nightly for muscle spasms  ?Neck HEP ?3 week follow up  ?
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
## 2022-01-26 ENCOUNTER — Telehealth: Payer: Self-pay | Admitting: Orthopaedic Surgery

## 2022-01-26 NOTE — Telephone Encounter (Signed)
Received $25.00cash, medical records release form and FMLA paperwork/Forwarding to [employer] today 

## 2022-01-27 ENCOUNTER — Ambulatory Visit (INDEPENDENT_AMBULATORY_CARE_PROVIDER_SITE_OTHER): Payer: BC Managed Care – PPO | Admitting: Sports Medicine

## 2022-01-27 VITALS — BP 112/80 | Ht 65.0 in | Wt 184.0 lb

## 2022-01-27 DIAGNOSIS — M545 Low back pain, unspecified: Secondary | ICD-10-CM

## 2022-01-27 DIAGNOSIS — G8929 Other chronic pain: Secondary | ICD-10-CM | POA: Diagnosis not present

## 2022-01-27 NOTE — Patient Instructions (Addendum)
Good to see you  Start hip low back hep  Discontinue daily meloxicam  use remainder as needed Tylenol [phone] mg 2-3 times a day for pain relief  As needed follow up

## 2022-02-01 ENCOUNTER — Ambulatory Visit (HOSPITAL_BASED_OUTPATIENT_CLINIC_OR_DEPARTMENT_OTHER): Payer: BC Managed Care – PPO | Admitting: Physical Therapy

## 2022-02-01 NOTE — Pre-Procedure Instructions (Signed)
Surgical Instructions    Your procedure is scheduled on Tuesday 02/14/22.   Report to [facility] Main Entrance "A" at 05:30 A.M., then check in with the Admitting office.  Call this number if you have problems the morning of surgery:  [phone]   If you have any questions prior to your surgery date call [phone]: Open Monday-Friday 8am-4pm    Remember:  Do not eat after midnight the night before your surgery  You may drink clear liquids until 04:30 A.M. the morning of your surgery.   Clear liquids allowed are: Water, Non-Citrus Juices (without pulp), Carbonated Beverages, Clear Tea, Black Coffee ONLY (NO MILK, CREAM OR POWDERED CREAMER of any kind), and Gatorade  Patient Instructions  The night before surgery:  No food after midnight. ONLY clear liquids after midnight  The day of surgery (if you do NOT have diabetes):  Drink ONE (1) Pre-Surgery Clear Ensure by 04:30 A.M. the morning of surgery. Drink in one sitting. Do not sip.  This drink was given to you during your hospital  pre-op appointment visit.  Nothing else to drink after completing the  Pre-Surgery Clear Ensure.         If you have questions, please contact your surgeon's office.     Take these medicines the morning of surgery with A SIP OF WATER:   methocarbamol (ROBAXIN)   Take these medicines if needed:   cyclobenzaprine (FLEXERIL)  LORazepam (ATIVAN)  oxyCODONE (OXY IR/ROXICODONE)  traMADol (ULTRAM)  As of today, STOP taking any Aspirin (unless otherwise instructed by your surgeon) Aleve, Naproxen, Ibuprofen, Motrin, Advil, Goody's, BC's, all herbal medications, fish oil, meloxicam (MOBIC), and all vitamins.  Oral Hygiene is also important to reduce your risk of infection.  Remember - BRUSH YOUR TEETH THE MORNING OF SURGERY WITH YOUR REGULAR TOOTHPASTE  [place]- Preparing for Total Shoulder Arthroplasty  Before surgery, you can play an important role. Because skin is not sterile, your skin  needs to be as free of germs as possible. You can reduce the number of germs on your skin by using the following products.   Benzoyl Peroxide Gel  o Reduces the number of germs present on the skin  o Applied twice a day to shoulder area starting two days before surgery   Chlorhexidine Gluconate (CHG) Soap (instructions listed above on how to wash with CHG Soap)  o An antiseptic cleaner that kills germs and bonds with the skin to continue killing germs even after washing  o Used for showering the night before surgery and morning of surgery   ==================================================================  Please follow these instructions carefully:  BENZOYL PEROXIDE 5% GEL  Please do not use if you have an allergy to benzoyl peroxide. If your skin becomes reddened/irritated stop using the benzoyl peroxide.  Starting two days before surgery, apply as follows:  1. Apply benzoyl peroxide in the morning and at night. Apply after taking a shower. If you are not taking a shower clean entire shoulder front, back, and side along with the armpit with a clean wet washcloth.  2. Place a quarter-sized dollop on your SHOULDER and rub in thoroughly, making sure to cover the front, back, and side of your shoulder, along with the armpit.   2 Days prior to Surgery First Dose on 02/12/22 Morning  Second Dose on 02/12/22 Night  Day Before Surgery First Dose on 02/13/22 Morning  Night before surgery wash (entire body except face and private areas) with CHG Soap THEN  Second Dose on  02/13/22 Night   Morning of Surgery  wash BODY AGAIN with CHG Soap   4. Do NOT apply benzoyl peroxide gel on the day of surgery   [place]- Preparing For Surgery  Before surgery, you can play an important role. Because skin is not sterile, your skin needs to be as free of germs as possible. You can reduce the number of germs on your skin by washing with CHG (chlorahexidine gluconate) Soap before surgery.   CHG is an antiseptic cleaner which kills germs and bonds with the skin to continue killing germs even after washing.     Please do not use if you have an allergy to CHG or antibacterial soaps. If your skin becomes reddened/irritated stop using the CHG.  Do not shave (including legs and underarms) for at least 48 hours prior to first CHG shower. It is OK to shave your face.  Please follow these instructions carefully.     Shower the NIGHT BEFORE SURGERY and the MORNING OF SURGERY with CHG Soap.   If you chose to wash your hair, wash your hair first as usual with your normal shampoo. After you shampoo, rinse your hair and body thoroughly to remove the shampoo.  Then [employer] and genitals (private parts) with your normal soap and rinse thoroughly to remove soap.  After that Use CHG Soap as you would any other liquid soap. You can apply CHG directly to the skin and wash gently with a scrungie or a clean washcloth.   Apply the CHG Soap to your body ONLY FROM THE NECK DOWN.  Do not use on open wounds or open sores. Avoid contact with your eyes, ears, mouth and genitals (private parts). Wash Face and genitals (private parts)  with your normal soap.   Wash thoroughly, paying special attention to the area where your surgery will be performed.  Thoroughly rinse your body with warm water from the neck down.  DO NOT shower/wash with your normal soap after using and rinsing off the CHG Soap.  Pat yourself dry with a CLEAN TOWEL.  8. Apply the Benzoyl Peroxide only the night before surgery.  Do Not use it the morning of surgery.  Wear CLEAN PAJAMAS to bed the night before surgery  Place CLEAN SHEETS on your bed the night before your surgery  DO NOT SLEEP WITH PETS.   Day of Surgery: Take a shower with CHG soap. Wear Clean/Comfortable clothing the morning of surgery Do not apply any deodorants/lotions.   Remember to brush your teeth WITH YOUR REGULAR TOOTHPASTE.   Please read over the  following fact sheets that you were given.                Do not wear jewelry or makeup Do not wear lotions, powders, perfumes/colognes, or deodorant. Do not shave 48 hours prior to surgery.  Men may shave face and neck. Do not bring valuables to the hospital. Do not wear nail polish, gel polish, artificial nails, or any other type of covering on natural nails (fingers and toes) If you have artificial nails or gel coating that need to be removed by a nail salon, please have this removed prior to surgery. Artificial nails or gel coating may interfere with anesthesia's ability to adequately monitor your vital signs.  [facility] is not responsible for any belongings or valuables. .   Do NOT Smoke (Tobacco/Vaping)  24 hours prior to your procedure  If you use a CPAP at night, you may bring your  mask for your overnight stay.   Contacts, glasses, hearing aids, dentures or partials may not be worn into surgery, please bring cases for these belongings   For patients admitted to the hospital, discharge time will be determined by your treatment team.   Patients discharged the day of surgery will not be allowed to drive home, and someone needs to stay with them for 24 hours.   SURGICAL WAITING ROOM VISITATION Patients having surgery or a procedure in a hospital may have two support people. Children under the age of 79 must have an adult with them who is not the patient. They may stay in the waiting area during the procedure and may switch out with other visitors. If the patient needs to stay at the hospital during part of their recovery, the visitor guidelines for inpatient rooms apply.  Please refer to the [facility] website for the visitor guidelines for Inpatients (after your surgery is over and you are in a regular room).       Special instructions:    Oral Hygiene is also important to reduce your risk of infection.  Remember - BRUSH YOUR TEETH THE MORNING OF SURGERY WITH YOUR REGULAR  TOOTHPASTE   If you received a COVID test during your pre-op visit, it is requested that you wear a mask when out in public, stay away from anyone that may not be feeling well, and notify your surgeon if you develop symptoms. If you have been in contact with anyone that has tested positive in the last 10 days, please notify your surgeon.    Please read over the following fact sheets that you were given.

## 2022-02-02 ENCOUNTER — Encounter (HOSPITAL_COMMUNITY): Payer: Self-pay

## 2022-02-02 ENCOUNTER — Encounter (HOSPITAL_COMMUNITY)
Admission: RE | Admit: 2022-02-02 | Discharge: 2022-02-02 | Disposition: A | Discharge status: Home / Self Care | Payer: BC Managed Care – PPO | Source: Ambulatory Visit | Attending: Orthopaedic Surgery | Admitting: Orthopaedic Surgery

## 2022-02-02 ENCOUNTER — Other Ambulatory Visit: Payer: Self-pay

## 2022-02-02 VITALS — BP 127/89 | HR 89 | Temp 98.6°F | Resp 17 | Ht 65.0 in | Wt 182.0 lb

## 2022-02-02 DIAGNOSIS — Z01812 Encounter for preprocedural laboratory examination: Secondary | ICD-10-CM | POA: Insufficient documentation

## 2022-02-02 DIAGNOSIS — Z01818 Encounter for other preprocedural examination: Secondary | ICD-10-CM

## 2022-02-02 LAB — CBC
HCT: 42.5 % (ref 36.0–46.0)
Hemoglobin: 13.4 g/dL (ref 12.0–15.0)
MCH: 29.4 pg (ref 26.0–34.0)
MCHC: 31.5 g/dL (ref 30.0–36.0)
MCV: 93.2 fL (ref 80.0–100.0)
Platelets: 195 10*3/uL (ref 150–400)
RBC: 4.56 MIL/uL (ref 3.87–5.11)
RDW: 13.2 % (ref 11.5–15.5)
WBC: 4.1 10*3/uL (ref 4.0–10.5)
nRBC: 0 % (ref 0.0–0.2)

## 2022-02-02 LAB — SURGICAL PCR SCREEN
MRSA, PCR: NEGATIVE
Staphylococcus aureus: NEGATIVE

## 2022-02-02 NOTE — Progress Notes (Signed)
PCP - Dr. Lyndal Pulley Cardiologist - denies  PPM/ICD - n/a   Chest x-ray - 12/28/21 EKG - 12/28/21 Stress Test - denies ECHO - denies Cardiac Cath - denies  Sleep Study - denies CPAP - n/a  Diabetes- denies  Blood Thinner Instructions: n/a Aspirin Instructions: n/a  ERAS Protcol - Clear liquids until 0430 day of surgery PRE-SURGERY Ensure or G2- Ensure  COVID TEST- n/a   Anesthesia review: No  Patient denies shortness of breath, fever, cough and chest pain at PAT appointment   All instructions explained to the patient, with a verbal understanding of the material. Patient agrees to go over the instructions while at home for a better understanding. Patient also instructed to self quarantine after being tested for COVID-19. The opportunity to ask questions was provided.

## 2022-02-13 NOTE — Anesthesia Preprocedure Evaluation (Addendum)
Anesthesia Evaluation  Patient identified by MRN, date of birth, ID band Patient awake    Reviewed: Allergy & Precautions, NPO status , Patient's Chart, lab work & pertinent test results  Airway Mallampati: II  TM Distance: >3 FB Neck ROM: Full    Dental no notable dental hx. (+) Teeth Intact, Dental Advisory Given   Pulmonary neg pulmonary ROS,    Pulmonary exam normal breath sounds clear to auscultation       Cardiovascular negative cardio ROS Normal cardiovascular exam Rhythm:Regular Rate:Normal     Neuro/Psych negative neurological ROS  negative psych ROS   GI/Hepatic negative GI ROS, Neg liver ROS,   Endo/Other  negative endocrine ROS  Renal/GU negative Renal ROS  negative genitourinary   Musculoskeletal  (+) Arthritis , Rheumatoid disorders,    Abdominal   Peds  Hematology negative hematology ROS (+)   Anesthesia Other Findings   Reproductive/Obstetrics                            Anesthesia Physical Anesthesia Plan  ASA: 2  Anesthesia Plan: General and Regional   Post-op Pain Management: Regional block* and Tylenol PO (pre-op)*   Induction: Intravenous  PONV Risk Score and Plan: 3 and Midazolam, Dexamethasone and Ondansetron  Airway Management Planned: Oral ETT  Additional Equipment:   Intra-op Plan:   Post-operative Plan: Extubation in OR  Informed Consent: I have reviewed the patients History and Physical, chart, labs and discussed the procedure including the risks, benefits and alternatives for the proposed anesthesia with the patient or authorized representative who has indicated his/her understanding and acceptance.     Dental advisory given  Plan Discussed with: CRNA  Anesthesia Plan Comments:         Anesthesia Quick Evaluation

## 2022-02-14 ENCOUNTER — Encounter (HOSPITAL_COMMUNITY): Payer: Self-pay | Admitting: Orthopaedic Surgery

## 2022-02-14 ENCOUNTER — Other Ambulatory Visit: Payer: Self-pay

## 2022-02-14 ENCOUNTER — Ambulatory Visit (HOSPITAL_COMMUNITY): Payer: BC Managed Care – PPO | Admitting: Anesthesiology

## 2022-02-14 ENCOUNTER — Ambulatory Visit (HOSPITAL_COMMUNITY): Payer: BC Managed Care – PPO

## 2022-02-14 ENCOUNTER — Encounter (HOSPITAL_COMMUNITY)
Admission: RE | Disposition: A | Discharge status: Home / Self Care | Payer: Self-pay | Source: Ambulatory Visit | Attending: Orthopaedic Surgery

## 2022-02-14 ENCOUNTER — Ambulatory Visit (HOSPITAL_COMMUNITY)
Admission: RE | Admit: 2022-02-14 | Discharge: 2022-02-14 | Disposition: A | Discharge status: Home / Self Care | Payer: BC Managed Care – PPO | Source: Ambulatory Visit | Attending: Orthopaedic Surgery | Admitting: Orthopaedic Surgery

## 2022-02-14 DIAGNOSIS — Z96612 Presence of left artificial shoulder joint: Secondary | ICD-10-CM | POA: Diagnosis not present

## 2022-02-14 DIAGNOSIS — Z471 Aftercare following joint replacement surgery: Secondary | ICD-10-CM | POA: Diagnosis not present

## 2022-02-14 DIAGNOSIS — M12812 Other specific arthropathies, not elsewhere classified, left shoulder: Secondary | ICD-10-CM | POA: Insufficient documentation

## 2022-02-14 DIAGNOSIS — G8918 Other acute postprocedural pain: Secondary | ICD-10-CM | POA: Diagnosis not present

## 2022-02-14 DIAGNOSIS — M069 Rheumatoid arthritis, unspecified: Secondary | ICD-10-CM | POA: Insufficient documentation

## 2022-02-14 DIAGNOSIS — Z9889 Other specified postprocedural states: Secondary | ICD-10-CM | POA: Diagnosis not present

## 2022-02-14 DIAGNOSIS — Z96642 Presence of left artificial hip joint: Secondary | ICD-10-CM | POA: Diagnosis not present

## 2022-02-14 HISTORY — PX: REVERSE SHOULDER ARTHROPLASTY: SHX5054

## 2022-02-14 SURGERY — ARTHROPLASTY, SHOULDER, TOTAL, REVERSE
Anesthesia: Regional | Site: Shoulder | Laterality: Left

## 2022-02-14 MED ORDER — PHENYLEPHRINE 80 MCG/ML (10ML) SYRINGE FOR IV PUSH (FOR BLOOD PRESSURE SUPPORT)
PREFILLED_SYRINGE | INTRAVENOUS | Status: AC
  Filled 2022-02-14: qty 10

## 2022-02-14 MED ORDER — LACTATED RINGERS IV SOLN: INTRAVENOUS | Status: DC

## 2022-02-14 MED ORDER — PROPOFOL 10 MG/ML IV BOLUS
INTRAVENOUS | Status: AC
  Filled 2022-02-14: qty 20

## 2022-02-14 MED ORDER — MIDAZOLAM HCL 2 MG/2ML IJ SOLN
INTRAMUSCULAR | Status: AC
  Filled 2022-02-14: qty 2

## 2022-02-14 MED ORDER — ONDANSETRON HCL 4 MG/2ML IJ SOLN
INTRAMUSCULAR | Status: DC | PRN
  Administered 2022-02-14: 4 mg via INTRAVENOUS

## 2022-02-14 MED ORDER — MIDAZOLAM HCL 2 MG/2ML IJ SOLN
INTRAMUSCULAR | Status: DC | PRN
  Administered 2022-02-14: 2 mg via INTRAVENOUS

## 2022-02-14 MED ORDER — LIDOCAINE 2% (20 MG/ML) 5 ML SYRINGE
INTRAMUSCULAR | Status: AC
Start: 2022-02-14 — End: ?
  Filled 2022-02-14: qty 5

## 2022-02-14 MED ORDER — LIDOCAINE 2% (20 MG/ML) 5 ML SYRINGE
INTRAMUSCULAR | Status: DC | PRN
  Administered 2022-02-14: 20 mg via INTRAVENOUS

## 2022-02-14 MED ORDER — PROPOFOL 10 MG/ML IV BOLUS
INTRAVENOUS | Status: DC | PRN
  Administered 2022-02-14: 110 mg via INTRAVENOUS

## 2022-02-14 MED ORDER — VANCOMYCIN HCL 1000 MG IV SOLR
INTRAVENOUS | Status: AC
Start: 2022-02-14 — End: ?
  Filled 2022-02-14: qty 20

## 2022-02-14 MED ORDER — BUPIVACAINE HCL (PF) 0.5 % IJ SOLN
INTRAMUSCULAR | Status: DC | PRN
  Administered 2022-02-14: 15 mL via PERINEURAL

## 2022-02-14 MED ORDER — CHLORHEXIDINE GLUCONATE 0.12 % MT SOLN
15.0000 mL | Freq: Once | OROMUCOSAL | Status: AC
  Administered 2022-02-14: 15 mL via OROMUCOSAL
  Filled 2022-02-14: qty 15

## 2022-02-14 MED ORDER — BUPIVACAINE LIPOSOME 1.3 % IJ SUSP
INTRAMUSCULAR | Status: DC | PRN
  Administered 2022-02-14: 10 mL via PERINEURAL

## 2022-02-14 MED ORDER — FENTANYL CITRATE (PF) 250 MCG/5ML IJ SOLN
INTRAMUSCULAR | Status: AC
  Filled 2022-02-14: qty 5

## 2022-02-14 MED ORDER — ACETAMINOPHEN 500 MG PO TABS: 1000.0000 mg | ORAL_TABLET | Freq: Once | ORAL | Status: DC

## 2022-02-14 MED ORDER — TRANEXAMIC ACID-NACL 1000-0.7 MG/100ML-% IV SOLN
1000.0000 mg | INTRAVENOUS | Status: AC
  Administered 2022-02-14: 1000 mg via INTRAVENOUS
  Filled 2022-02-14: qty 100

## 2022-02-14 MED ORDER — DEXAMETHASONE SODIUM PHOSPHATE 10 MG/ML IJ SOLN
INTRAMUSCULAR | Status: AC
Start: 2022-02-14 — End: ?
  Filled 2022-02-14: qty 1

## 2022-02-14 MED ORDER — ROCURONIUM BROMIDE 10 MG/ML (PF) SYRINGE
PREFILLED_SYRINGE | INTRAVENOUS | Status: AC
Start: 2022-02-14 — End: ?
  Filled 2022-02-14: qty 10

## 2022-02-14 MED ORDER — GABAPENTIN 300 MG PO CAPS
300.0000 mg | ORAL_CAPSULE | Freq: Once | ORAL | Status: AC
  Administered 2022-02-14: 300 mg via ORAL
  Filled 2022-02-14: qty 1

## 2022-02-14 MED ORDER — FENTANYL CITRATE (PF) 250 MCG/5ML IJ SOLN
INTRAMUSCULAR | Status: DC | PRN
Start: 2022-02-14 — End: 2022-02-14
  Administered 2022-02-14 (×2): 50 ug via INTRAVENOUS

## 2022-02-14 MED ORDER — 0.9 % SODIUM CHLORIDE (POUR BTL) OPTIME
TOPICAL | Status: DC | PRN
  Administered 2022-02-14: 1000 mL

## 2022-02-14 MED ORDER — SUGAMMADEX SODIUM 200 MG/2ML IV SOLN
INTRAVENOUS | Status: DC | PRN
  Administered 2022-02-14: 200 mg via INTRAVENOUS

## 2022-02-14 MED ORDER — ORAL CARE MOUTH RINSE: 15.0000 mL | Freq: Once | OROMUCOSAL | Status: AC

## 2022-02-14 MED ORDER — FENTANYL CITRATE (PF) 100 MCG/2ML IJ SOLN: 25.0000 ug | INTRAMUSCULAR | Status: DC | PRN

## 2022-02-14 MED ORDER — PHENYLEPHRINE HCL-NACL 20-0.9 MG/250ML-% IV SOLN
INTRAVENOUS | Status: DC | PRN
  Administered 2022-02-14: 20 ug/min via INTRAVENOUS

## 2022-02-14 MED ORDER — VANCOMYCIN HCL 1000 MG IV SOLR
INTRAVENOUS | Status: DC | PRN
  Administered 2022-02-14: 1000 mg via TOPICAL

## 2022-02-14 MED ORDER — ROCURONIUM BROMIDE 10 MG/ML (PF) SYRINGE
PREFILLED_SYRINGE | INTRAVENOUS | Status: DC | PRN
  Administered 2022-02-14: 20 mg via INTRAVENOUS
  Administered 2022-02-14: 80 mg via INTRAVENOUS

## 2022-02-14 MED ORDER — ACETAMINOPHEN 500 MG PO TABS
1000.0000 mg | ORAL_TABLET | Freq: Once | ORAL | Status: AC
  Administered 2022-02-14: 1000 mg via ORAL
  Filled 2022-02-14: qty 2

## 2022-02-14 MED ORDER — CEFAZOLIN SODIUM-DEXTROSE 2-4 GM/100ML-% IV SOLN
2.0000 g | INTRAVENOUS | Status: AC
  Administered 2022-02-14: 2 g via INTRAVENOUS
  Filled 2022-02-14: qty 100

## 2022-02-14 MED ORDER — SODIUM CHLORIDE 0.9 % IR SOLN
Status: DC | PRN
  Administered 2022-02-14: 3000 mL

## 2022-02-14 MED ORDER — DEXAMETHASONE SODIUM PHOSPHATE 10 MG/ML IJ SOLN
INTRAMUSCULAR | Status: DC | PRN
  Administered 2022-02-14: 10 mg via INTRAVENOUS

## 2022-02-14 MED ORDER — ONDANSETRON HCL 4 MG/2ML IJ SOLN
INTRAMUSCULAR | Status: AC
Start: 2022-02-14 — End: ?
  Filled 2022-02-14: qty 2

## 2022-02-14 SURGICAL SUPPLY — 83 items
AID PSTN UNV HD RSTRNT DISP (MISCELLANEOUS) ×1
ANCH SUT 2 2.9 2 LD TPR NDL (Anchor) ×2 IMPLANT
ANCHOR JUGGERKNOT WTAP NDL 2.9 (Anchor) ×2 IMPLANT
APL PRP STRL LF DISP 70% ISPRP (MISCELLANEOUS) ×1
AUG BASEPLATE 15DEG 25 WEDGE (Joint) ×2 IMPLANT
AUGMENT BASEPLATE 15DEG 25 WDG (Joint) IMPLANT
BAG COUNTER SPONGE SURGICOUNT (BAG) ×2 IMPLANT
BAG SPNG CNTER NS LX DISP (BAG) ×1
BIT DRILL 3.2 PERIPHERAL SCREW (BIT) ×1 IMPLANT
BIT DRILL JUGGERKNOT 2.9 (BIT) ×1 IMPLANT
BLADE SAW SAG 29X58X.64 (BLADE) ×2 IMPLANT
BLADE SURG 15 STRL LF DISP TIS (BLADE) ×1 IMPLANT
BLADE SURG 15 STRL SS (BLADE) ×2
BSPLAT GLND 15D 25 FULL WDG (Joint) ×1 IMPLANT
CHLORAPREP W/TINT 26 (MISCELLANEOUS) ×2 IMPLANT
COOLER ICEMAN CLASSIC (MISCELLANEOUS) ×2 IMPLANT
COVER SURGICAL LIGHT HANDLE (MISCELLANEOUS) ×2 IMPLANT
DRAPE IMP U-DRAPE 54X76 (DRAPES) ×2 IMPLANT
DRAPE INCISE IOBAN 66X45 STRL (DRAPES) ×2 IMPLANT
DRAPE ORTHO SPLIT 77X108 STRL (DRAPES) ×2
DRAPE STERI 35X30 U-POUCH (DRAPES) ×2 IMPLANT
DRAPE SURG ORHT 6 SPLT 77X108 (DRAPES) ×1 IMPLANT
DRAPE U-SHAPE 47X51 STRL (DRAPES) ×4 IMPLANT
DRSG AQUACEL AG ADV 3.5X10 (GAUZE/BANDAGES/DRESSINGS) ×2 IMPLANT
ELECT BLADE 4.0 EZ CLEAN MEGAD (MISCELLANEOUS) ×4
ELECT REM PT RETURN 9FT ADLT (ELECTROSURGICAL) ×2
ELECTRODE BLDE 4.0 EZ CLN MEGD (MISCELLANEOUS) ×1 IMPLANT
ELECTRODE REM PT RTRN 9FT ADLT (ELECTROSURGICAL) ×1 IMPLANT
GLENOSPHERE REV SHOULDER 36 (Joint) ×1 IMPLANT
GLOVE BIOGEL PI IND STRL 7.0 (GLOVE) ×1 IMPLANT
GLOVE BIOGEL PI IND STRL 8 (GLOVE) ×1 IMPLANT
GLOVE BIOGEL PI INDICATOR 7.0 (GLOVE) ×1
GLOVE BIOGEL PI INDICATOR 8 (GLOVE) ×1
GLOVE ECLIPSE 7.0 STRL STRAW (GLOVE) ×2 IMPLANT
GLOVE ECLIPSE 8.0 STRL XLNG CF (GLOVE) ×2 IMPLANT
GLOVE SURG ENC MOIS LTX SZ6 (GLOVE) ×6 IMPLANT
GLOVE SURG SYN 7.5  E (GLOVE) ×6
GLOVE SURG SYN 7.5 E (GLOVE) ×3 IMPLANT
GLOVE SURG SYN 7.5 PF PI (GLOVE) ×3 IMPLANT
GLOVE SURG UNDER POLY LF SZ6.5 (GLOVE) ×4 IMPLANT
GOWN STRL REUS W/ TWL LRG LVL3 (GOWN DISPOSABLE) ×2 IMPLANT
GOWN STRL REUS W/TWL LRG LVL3 (GOWN DISPOSABLE) ×4
GUIDE PIN 3X75 SHOULDER (PIN) ×4
GUIDEWIRE GLENOID 2.5X220 (WIRE) ×1 IMPLANT
HANDPIECE INTERPULSE COAX TIP (DISPOSABLE) ×2
INSERT REVERSED HUMERAL SIZE 1 (Orthopedic Implant) ×1 IMPLANT
KIT BASIN OR (CUSTOM PROCEDURE TRAY) ×2 IMPLANT
KIT STABILIZATION SHOULDER (MISCELLANEOUS) ×1 IMPLANT
KIT TURNOVER KIT B (KITS) ×2 IMPLANT
MANIFOLD NEPTUNE II (INSTRUMENTS) ×2 IMPLANT
NDL HYPO 25GX1X1/2 BEV (NEEDLE) ×1 IMPLANT
NEEDLE HYPO 25GX1X1/2 BEV (NEEDLE) ×2 IMPLANT
NS IRRIG 1000ML POUR BTL (IV SOLUTION) ×2 IMPLANT
PACK SHOULDER (CUSTOM PROCEDURE TRAY) ×2 IMPLANT
PACK UNIVERSAL I (CUSTOM PROCEDURE TRAY) ×1 IMPLANT
PAD ARMBOARD 7.5X6 YLW CONV (MISCELLANEOUS) ×4 IMPLANT
PAD COLD SHLDR WRAP-ON (PAD) ×2 IMPLANT
PENCIL BUTTON HOLSTER BLD 10FT (ELECTRODE) ×1 IMPLANT
PIN GUIDE 3X75 SHOULDER (PIN) IMPLANT
RESTRAINT HEAD UNIVERSAL NS (MISCELLANEOUS) ×2 IMPLANT
SCREW 5.5X22 (Screw) ×3 IMPLANT
SCREW 5.5X26 (Screw) ×1 IMPLANT
SCREW BONE INTRNL SM 7 (Screw) ×1 IMPLANT
SET HNDPC FAN SPRY TIP SCT (DISPOSABLE) ×1 IMPLANT
SLING ARM IMMOBILIZER LRG (SOFTGOODS) ×1 IMPLANT
SPONGE T-LAP 18X18 ~~LOC~~+RFID (SPONGE) ×3 IMPLANT
STAPLER VISISTAT 35W (STAPLE) ×1 IMPLANT
STEM HUMERAL STD SHORT SIZE 2 (Orthopedic Implant) ×1 IMPLANT
STRIP CLOSURE SKIN 1/2X4 (GAUZE/BANDAGES/DRESSINGS) ×2 IMPLANT
SUCTION FRAZIER HANDLE 10FR (MISCELLANEOUS) ×2
SUCTION TUBE FRAZIER 10FR DISP (MISCELLANEOUS) ×1 IMPLANT
SUT FIBERWIRE #2 38 REV NDL BL (SUTURE) ×2
SUT MNCRL AB 3-0 PS2 27 (SUTURE) ×1 IMPLANT
SUT VIC AB 0 CT1 27 (SUTURE) ×4
SUT VIC AB 0 CT1 27XBRD ANBCTR (SUTURE) ×1 IMPLANT
SUT VIC AB 2-0 CT1 27 (SUTURE) ×2
SUT VIC AB 2-0 CT1 TAPERPNT 27 (SUTURE) IMPLANT
SUTURE FIBERWR#2 38 REV NDL BL (SUTURE) IMPLANT
SYR CONTROL 10ML LL (SYRINGE) ×2 IMPLANT
TOWEL GREEN STERILE (TOWEL DISPOSABLE) ×2 IMPLANT
TOWER CARTRIDGE SMART MIX (DISPOSABLE) ×1 IMPLANT
TRAY FOLEY MTR SLVR 16FR STAT (SET/KITS/TRAYS/PACK) ×1 IMPLANT
WATER STERILE IRR 1000ML POUR (IV SOLUTION) ×1 IMPLANT

## 2022-02-14 NOTE — Anesthesia Procedure Notes (Signed)
Procedure Name: Intubation Date/Time: 02/14/2022 7:48 AM  Performed by: Lorie Phenix, CRNAPre-anesthesia Checklist: Patient identified, Emergency Drugs available, Suction available and Patient being monitored Patient Re-evaluated:Patient Re-evaluated prior to induction Oxygen Delivery Method: Circle system utilized Preoxygenation: Pre-oxygenation with 100% oxygen Induction Type: IV induction Ventilation: Mask ventilation without difficulty Laryngoscope Size: Mac and 3 Grade View: Grade I Tube type: Oral Tube size: 7.5 mm Number of attempts: 1 Airway Equipment and Method: Stylet Placement Confirmation: ETT inserted through vocal cords under direct vision, positive ETCO2 and breath sounds checked- equal and bilateral Secured at: 21 cm Tube secured with: Tape Dental Injury: Teeth and Oropharynx as per pre-operative assessment

## 2022-02-14 NOTE — Interval H&P Note (Signed)
History and Physical Interval Note:  02/14/2022 7:29 AM  [name]  has presented today for surgery, with the diagnosis of LEFT IRREPAIRABLE [place].  The various methods of treatment have been discussed with the patient and family. After consideration of risks, benefits and other options for treatment, the patient has consented to  Procedure(s): LEFT REVERSE SHOULDER ARTHROPLASTY (Left) as a surgical intervention.  The patient's history has been reviewed, patient examined, no change in status, stable for surgery.  I have reviewed the patient's chart and labs.  Questions were answered to the patient's satisfaction.     Vanetta Mulders

## 2022-02-14 NOTE — Anesthesia Postprocedure Evaluation (Signed)
Anesthesia Post Note  Patient: [name]  Procedure(s) Performed: LEFT REVERSE SHOULDER ARTHROPLASTY (Left: Shoulder)     Patient location during evaluation: PACU Anesthesia Type: Regional and General Level of consciousness: awake and alert Pain management: pain level controlled Vital Signs Assessment: post-procedure vital signs reviewed and stable Respiratory status: spontaneous breathing, nonlabored ventilation, respiratory function stable and patient connected to nasal cannula oxygen Cardiovascular status: blood pressure returned to baseline and stable Postop Assessment: no apparent nausea or vomiting Anesthetic complications: no   No notable events documented.  Last Vitals:  Vitals:   02/14/22 1020 02/14/22 1033  BP: 130/72 121/80  Pulse: 62 63  Resp: 15 15  Temp:  (!) 36.2 C  SpO2: 98% 95%    Last Pain:  Vitals:   02/14/22 1033  TempSrc:   PainSc: 0-No pain                 Eleno Weimar L Zellie Jenning

## 2022-02-14 NOTE — Transfer of Care (Signed)
Immediate Anesthesia Transfer of Care Note  Patient: [name]  Procedure(s) Performed: LEFT REVERSE SHOULDER ARTHROPLASTY (Left: Shoulder)  Patient Location: PACU  Anesthesia Type:General and Regional  Level of Consciousness: awake and alert   Airway & Oxygen Therapy: Patient Spontanous Breathing  Post-op Assessment: Report given to RN and Post -op Vital signs reviewed and stable  Post vital signs: Reviewed and stable  Last Vitals:  Vitals Value Taken Time  BP 131/87 02/14/22 1005  Temp    Pulse 65 02/14/22 1008  Resp 14 02/14/22 1008  SpO2 100 % 02/14/22 1008  Vitals shown include unvalidated device data.  Last Pain:  Vitals:   02/14/22 0608  TempSrc:   PainSc: 0-No pain         Complications: No notable events documented.

## 2022-02-14 NOTE — Anesthesia Procedure Notes (Addendum)
Anesthesia Regional Block: Interscalene brachial plexus block   Pre-Anesthetic Checklist: , timeout performed,  Correct Patient, Correct Site, Correct Laterality,  Correct Procedure, Correct Position, site marked,  Risks and benefits discussed,  Pre-op evaluation,  At surgeon's request and post-op pain management  Laterality: Left  Prep: Maximum Sterile Barrier Precautions used, chloraprep       Needles:  Injection technique: Single-shot  Needle Type: Echogenic Stimulator Needle     Needle Length: 4cm  Needle Gauge: 21     Additional Needles:   Procedures:, nerve stimulator,,, ultrasound used (permanent image in chart),,     Nerve Stimulator or Paresthesia:  Response: Peroneal Response: Tibial  Additional Responses:   Narrative:  Start time: 02/14/2022 7:00 AM End time: 02/14/2022 7:04 AM Injection made incrementally with aspirations every 5 mL. Anesthesiologist: Freddrick March, MD

## 2022-02-14 NOTE — H&P (Signed)
Chief Complaint: Left shoulder pain        History of Present Illness:   12/19/2021: Presents today for MRI discussion of the left shoulder.  [name] has persistent pain in her left shoulder following rotator cuff repair done 6 years prior at an outside practice.  She states for the last 6 months she has had persistent shoulder pain and disability.  She is having extremely hard time laying on the side.  She is not able to lift overhead to perform the basics of her job working at the [employer] in [place].  She has been working on her exercise program which she was instructed on following her initial rotator cuff repair for the last several months with no improvement in her symptoms.  At today's visit pain and nighttime awakenings are her predominant complaint.  Although she is also quite bothered by her increasing inability to perform her job.   [name] is a 61 y.o. female right-hand-dominant presents today with ongoing left shoulder pain that has been significant for the last several months.  She states that she did have a previous rotator cuff repair done at [facility] 6 years prior.  Since that time she was doing well until several months ago.  Since that time she has had a hard time sleeping and using the arm overhead.  She does work at [employer] in [place] and is really not able to lift overhead with any type of significant strength.  She denies any specific injury or accident.  She does state that the shoulder feels a lot like it did when she had previously torn her rotator cuff.  She states that she is experiencing cramping and anterior based shoulder pain that goes down the arm and down the biceps.  She has taken anti-inflammatories for pain.  She has not had any recent injections or therapy.       Surgical History:   Left shoulder rotator cuff repair 6 years prior at [facility]   PMH/PSH/Family History/Social History/Meds/Allergies:          Past Medical History:  Diagnosis Date   Arthritis      rheumatoid per pt         Past Surgical History:  Procedure Laterality Date   ABDOMINAL HYSTERECTOMY   2001    TAH/BSO fibroids   CATARACT EXTRACTION Right     COLONOSCOPY   04/27/2011   obstructed bile duct   2004    Dr Sharmaine Base   OOPHORECTOMY        BSO   ROTATOR CUFF REPAIR Bilateral 12/2010    Left and right    Social History         Socioeconomic History   Marital status: Divorced      Spouse name: Not on file   Number of children: 1   Years of education: Not on file   Highest education level: Not on file  Occupational History   Occupation: MEAT CUTTER      Employer: [employer]  Tobacco Use   Smoking status: Never   Smokeless tobacco: Never  Vaping Use   Vaping Use: Never used  Substance and Sexual Activity   Alcohol use: Yes      Alcohol/week: 0.0 standard drinks      Comment: Occas. wine   Drug use: No   Sexual activity: Not Currently      Partners: Male      Birth control/protection:  Surgical      Comment: 1st intercourse 61 yo-More than 5 partners,hysterectomy  Other Topics Concern   Not on file  Social History Narrative   Not on file    Social Determinants of Health    Financial Resource Strain: Not on file  Food Insecurity: Not on file  Transportation Needs: Not on file  Physical Activity: Not on file  Stress: Not on file  Social Connections: Not on file         Family History  Problem Relation Age of Onset   Heart disease Mother 89        MI   Stroke Mother     Hypertension Mother     Arthritis Mother     Pulmonary embolism Mother     Prostate cancer Father     Hypertension Father     Arthritis Sister     Colon cancer Neg Hx     Colon polyps Neg Hx     Esophageal cancer Neg Hx     Rectal cancer Neg Hx     Stomach cancer Neg Hx      No Known Allergies       Current Outpatient Medications  Medication Sig Dispense Refill   acetaminophen (TYLENOL) 500 MG tablet Take 1  tablet (500 mg total) by mouth every 8 (eight) hours for 10 days. 30 tablet 0   aspirin EC 325 MG tablet Take 1 tablet (325 mg total) by mouth daily. 30 tablet 0   ibuprofen (ADVIL) 800 MG tablet Take 1 tablet (800 mg total) by mouth every 8 (eight) hours for 10 days. Please take with food, please alternate with acetaminophen 30 tablet 0   oxyCODONE (OXY IR/ROXICODONE) 5 MG immediate release tablet Take 1 tablet (5 mg total) by mouth every 4 (four) hours as needed (severe pain). 20 tablet 0   Acetaminophen (TYLENOL ARTHRITIS PAIN PO) Take by mouth.       Cholecalciferol (VITAMIN D PO) Take by mouth.       diclofenac (VOLTAREN) 75 MG EC tablet Take 1 tablet (75 mg total) by mouth 2 (two) times daily. 60 tablet 0   LORazepam (ATIVAN) 1 MG tablet Take 1 tablet (1 mg total) by mouth every 8 (eight) hours. 2 tablet 0   methylPREDNISolone (MEDROL) 4 MG tablet Take as directed 21 tablet 0   Multiple Vitamin (MULTIVITAMIN) tablet Take 1 tablet by mouth daily.       oxybutynin (DITROPAN-XL) 5 MG 24 hr tablet Take 5 mg by mouth daily.       traMADol (ULTRAM) 50 MG tablet Take 1 tablet (50 mg total) by mouth every 6 (six) hours as needed. 30 tablet 0    No current facility-administered medications for this visit.     Imaging Results (Last 48 hours)  MR Shoulder Left Wo Contrast   Result Date: 12/18/2021 CLINICAL DATA:  Shoulder pain. Rotator cuff disorder suspected. Chronic. EXAM: MRI OF THE LEFT SHOULDER WITHOUT CONTRAST TECHNIQUE: Multiplanar, multisequence MR imaging of the shoulder was performed. No intravenous contrast was administered. COMPARISON:  Left shoulder radiographs 12/07/2021 FINDINGS: Rotator cuff: There are is a screw anchor within the humeral head greater tuberosity in the region of the posterior supraspinatus and anterior infraspinatus tendon footprint insertions, likely from prior rotator cuff repair. There is mild intermediate T2 signal within the adjacent tendons coronal presumably  postsurgical change. There is also scattered postsurgical metallic susceptibility artifact in this region. No fluid bright tear. The subscapularis and teres minor  are intact. Muscles: Mild anterior supraspinatus muscle atrophy with mild supraspinatus and infraspinatus diffuse feathery fatty infiltration. There is a small fat density likely intramuscular lipoma within the teres major muscle measuring up to approximately 12 x 5 x 21 mm (craniocaudal by AP by transverse shoulder dimensions; sagittal series 11, image 6 and coronal series 9 image 10). Biceps long head: The long head of the biceps tendon is not visualized proximal to the bicipital groove or within the proximal aspect of the bicipital groove, likely from prior proximal tendon rupture versus postsurgical change (tenotomy versus tenodesis). Acromioclavicular Joint: There appear to be postsurgical changes of prior acromioplasty and distal clavicle partial excision. No subacromial/subdeltoid bursitis. Glenohumeral Joint: Moderate thinning of the glenoid and humeral head cartilage. Moderate anterior superior glenoid subchondral degenerative cystic change. Labrum: Lack of intra-articular fluid limits evaluation of the glenoid labrum. There appears to be mild attenuation of the posterosuperior glenoid labrum with mild surrounding fluid likely at least degenerative change. Bones: Mild inferior glenoid and humeral head-neck junction degenerative osteophytes. Other: None. IMPRESSION:: IMPRESSION: 1. Prior posterior supraspinatus and anterior infraspinatus rotator cuff repair. No fluid bright rotator cuff tear is seen. 2. Mild anterior supraspinatus muscle atrophy and mild supraspinatus and infraspinatus fatty infiltration. 3. 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 4. The long head of the biceps tendon is not visualized until the mid aspect of the bicipital groove likely from prior proximal tendon rupture versus postsurgical change (tenotomy versus  tenodesis). 5. Postsurgical changes of prior acromioplasty and distal clavicle partial excision. 6. Moderate glenohumeral cartilage degenerative changes. Electronically Signed   By: Yvonne Kendall M.D.   On: 12/18/2021 23:16       Review of Systems:   A ROS was performed including pertinent positives and negatives as documented in the HPI.   Physical Exam :   Constitutional: NAD and appears stated age Neurological: Alert and oriented Psych: Appropriate affect and cooperative There were no vitals taken for this visit.    Comprehensive Musculoskeletal Exam:     Musculoskeletal Exam      Inspection Right Left  Skin No atrophy or winging No atrophy or winging  Palpation      Tenderness None Lateral deltoid, bicipital groove  Range of Motion      Flexion (passive) 170 160  Flexion (active) 170 1160  Abduction 170 160  ER at the side 70 50  Can reach behind back to T12 T12  Strength        Full 4/5 supra, infraspinatus as well as a positive belly press  Special Tests      Pseudoparalytic No No  Neurologic      Fires PIN, radial, median, ulnar, musculocutaneous, axillary, suprascapular, long thoracic, and spinal accessory innervated muscles. No abnormal sensibility  Vascular/Lymphatic      Radial Pulse 2+ 2+  Cervical Exam      Patient has symmetric cervical range of motion with negative Spurling's test.  Special Test: Positive Neer impingement with weakness        Imaging:   Xray (3 views left shoulder): There is evidence of previous distal clavicle resection.    MRI left shoulder: Status post repair of her supraspinatus tendon.  There is significant stranding in the substance of the supraspinatus without a frank root tear.  On T1 imaging there is a positive tangent sign with significant fatty degeneration of the supraspinatus muscle.  There is an intramuscular lipoma of the teres major.  There is moderate glenohumeral degenerative changes  involving the left shoulder.     I  personally reviewed and interpreted the radiographs.     Assessment:   61 y.o. female presents with left shoulder pain in the setting of a known rotator cuff repair 6 years prior.  At this time she has been working on a home strengthening program which she was specifically instructed on previously following a rotator cuff repair done 6 years prior.  This is given her no relief over the course of the last 2 months.  I discussed the results of her MRI of her shoulder with her.  I did discuss that while there is no frank retear of her rotator cuff there does appear to be very degenerative tendon in the setting of significant supraspinatus atrophy and fatty deposition.  To this effect I do not necessarily believe that her rotator cuff is functioning well and when in combination with her moderate degenerative findings of the glenohumeral joint I do believe that her shoulder pain and dysfunction are a result of rotator cuff arthropathy as a whole.  We discussed treatment options.  She has showed me the exercises she has been working on which I do believe are the correct exercises in terms of a rotator cuff strengthening program.  These have been ineffective.  I have discussed the possibility of an injection although she is quite concerned about her overall strength and weakness and inability to perform her job.  She has previously had injections prior to her previous rotator cuff repair and is not willing to undergo anything that would just be a transient or temporary solution.  She is having significant pain and has not been able to sleep well for the last several months.  Specifically with regard to surgery, given the fact that she does have degenerative findings about the glenohumeral joint, I do not believe that she would necessarily be a good candidate for any type of rotator cuff augmentation.  We did discuss the possibility of reverse shoulder arthroplasty given her arthropathy findings.  She would like to  proceed with this after long discussion. Plan :     -Plan for left shoulder reverse shoulder arthroplasty        After a lengthy discussion of treatment options, including risks, benefits, alternatives, complications of surgical and nonsurgical conservative options, the patient elected surgical repair.    The patient  is aware of the material risks  and complications including, but not limited to injury to adjacent structures, neurovascular injury, infection, numbness, bleeding, implant failure, thermal burns, stiffness, persistent pain, failure to heal, disease transmission from allograft, need for further surgery, dislocation, anesthetic risks, blood clots, risks of death,and others. The probabilities of surgical success and failure discussed with patient given their particular co-morbidities.The time and nature of expected rehabilitation and recovery was discussed.The patient's questions were all answered preoperatively.  No barriers to understanding were noted. I explained the natural history of the disease process and Rx rationale.  I explained to the patient what I considered to be reasonable expectations given their personal situation.  The final treatment plan was arrived at through a shared patient decision making process model.   Patient was prescribed a shoulder immobilizer today for the diagnosis listed above under assessment. The patient requires stabilization from this orthosis in their left arm. I personally saw and evaluated the patient, and participated in the management and treatment plan.   Vanetta Mulders, MD Attending Physician, Orthopedic Surgery

## 2022-02-14 NOTE — Brief Op Note (Signed)
   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 Item Serial No. Manufacturer Lot No. LRB No. Used Action  SCREW 5.5X26 - [id number] Screw SCREW 5.5X26  [employer]  Left 1 Implanted  SCREW 5.5X22 - [id number] Screw SCREW 5.5X22  [employer]  Left 3 Implanted  AUG BASEPLATE 15DEG 25 WEDGE - [id number] Joint AUG BASEPLATE 15DEG 25 WEDGE [id number] [employer]  Left 1 Implanted  SCREW BONE INTRNL SM 7 - [id number] Screw SCREW BONE INTRNL SM 7 [id number] [employer]  Left 1 Implanted  STEM HUMERAL STD SHORT SIZE 2 - [id number] Orthopedic Implant STEM HUMERAL STD SHORT SIZE 2  [employer] [id number] Left 1 Implanted  INSERT REVERSED HUMERAL SIZE 1 - [id number] Orthopedic Implant INSERT REVERSED HUMERAL SIZE 1  [employer] [id number] Left 1 Implanted  GLENOSPHERE REV SHOULDER 36 - [id number] Joint GLENOSPHERE REV SHOULDER 36  [employer] [id number] Left 1 Implanted  ANCHOR JUGGERKNOT WTAP NDL 2.9 - [id number] Anchor ANCHOR JUGGERKNOT WTAP NDL 2.9  [employer](ORTH,TRAU,BIO,SG) [id number] Left 1 Implanted  ANCHOR JUGGERKNOT WTAP NDL 2.9 - [id number] Anchor ANCHOR JUGGERKNOT WTAP NDL 2.9  [employer](ORTH,TRAU,BIO,SG)  Left 1 Implanted    Surgeons: Surgeon(s): Vanetta Mulders, MD  Anesthesia: General    Estimated Blood Loss: See anesthesia record  Complications: None  Condition to PACU: Stable  Yevonne Pax, MD 02/14/2022 10:00 AM

## 2022-02-14 NOTE — Discharge Instructions (Signed)
     Discharge Instructions    Attending Surgeon: Vanetta Mulders, MD Office Phone Number: [phone]   Diagnosis and Procedures:    Surgeries Performed: Left reverse shoulder arthroplasty  Discharge Plan:    Diet: Resume usual diet. Begin with light or bland foods.  Drink plenty of fluids.  Activity:  Keep sling and dressing in place until your follow up visit in Physical Therapy You are advised to go home directly from the hospital or surgical center. Restrict your activities.  GENERAL INSTRUCTIONS: 1.  Keep your surgical site elevated above your heart for at least 5-7 days or longer to prevent swelling. This will improve your comfort and your overall recovery following surgery.     2. Please call Dr. Eddie Dibbles office at [phone] with questions Monday-Friday during business hours. If no one answers, please leave a message and someone should get back to the patient within 24 hours. For emergencies please call 911 or proceed to the emergency room.   3. Patient to notify surgical team if experiences any of the following: Bowel/Bladder dysfunction, uncontrolled pain, nerve/muscle weakness, incision with increased drainage or redness, nausea/vomiting and Fever greater than 101.0 F.  Be alert for signs of infection including redness, streaking, odor, fever or chills. Be alert for excessive pain or bleeding and notify your surgeon immediately.  WOUND INSTRUCTIONS:   Leave your dressing/cast/splint in place until your post operative visit.  Keep it clean and dry.  Always keep the incision clean and dry until the staples/sutures are removed. If there is no drainage from the incision you should keep it open to air. If there is drainage from the incision you must keep it covered at all times until the drainage stops  Do not soak in a bath tub, hot tub, pool, lake or other body of water until 21 days after your surgery and your incision is completely dry and healed.  If you have  removable sutures (or staples) they must be removed 10-14 days (unless otherwise instructed) from the day of your surgery.     1)  Elevate the extremity as much as possible.  2)  Keep the dressing clean and dry.  3)  Please call us if the dressing becomes wet or dirty.  4)  If you are experiencing worsening pain or worsening swelling, please call.     MEDICATIONS: Resume all previous home medications at the previous prescribed dose and frequency unless otherwise noted Start taking the  pain medications on an as-needed basis as prescribed  Please taper down pain medication over the next week following surgery.  Ideally you should not require a refill of any narcotic pain medication.  Take pain medication with food to minimize nausea. In addition to the prescribed pain medication, you may take over-the-counter pain relievers such as Tylenol.  Do NOT take additional tylenol if your pain medication already has tylenol in it.  Aspirin '325mg'$  daily for four weeks.      FOLLOWUP INSTRUCTIONS: 1. Follow up at the Physical Therapy Clinic 3-4 days following surgery. This appointment should be scheduled unless other arrangements have been made.The Physical Therapy scheduling number is [phone] if an appointment has not already been arranged.  2. Contact Dr. Eddie Dibbles office during office hours at [phone] or the practice after hours line at [phone] for non-emergencies. For medical emergencies call 911.   Discharge Location: Home

## 2022-02-14 NOTE — Op Note (Addendum)
Date of Surgery: 02/14/2022  INDICATIONS: [name] is a 61 y.o.-year-old female with left shoulder rotator cuff arthropathy which is failed conservative management.  The risk and benefits of the procedure were discussed in detail and documented in the pre-operative evaluation.   PREOPERATIVE DIAGNOSIS: 1.  Left rotator cuff arthropathy  POSTOPERATIVE DIAGNOSIS: Same.  PROCEDURE: 1.  Left reverse shoulder arthroplasty  SURGEON: Yevonne Pax MD  ASSISTANT: Raynelle Fanning, ATC  ANESTHESIA:  general interscalene nerve block  IV FLUIDS AND URINE: See anesthesia record.  ANTIBIOTICS: Ancef  ESTIMATED BLOOD LOSS: 50 mL.  IMPLANTS:  Implant Name Type Inv. Item Serial No. Manufacturer Lot No. LRB No. Used Action  SCREW 5.5X26 - [id number] Screw SCREW 5.5X26  [employer]  Left 1 Implanted  SCREW 5.5X22 - [id number] Screw SCREW 5.5X22  [employer]  Left 3 Implanted  AUG BASEPLATE 15DEG 25 WEDGE - [id number] Joint AUG BASEPLATE 15DEG 25 WEDGE [id number] [employer]  Left 1 Implanted  SCREW BONE INTRNL SM 7 - [id number] Screw SCREW BONE INTRNL SM 7 [id number] [employer]  Left 1 Implanted  STEM HUMERAL STD SHORT SIZE 2 - [id number] Orthopedic Implant STEM HUMERAL STD SHORT SIZE 2  [employer] [id number] Left 1 Implanted  INSERT REVERSED HUMERAL SIZE 1 - [id number] Orthopedic Implant INSERT REVERSED HUMERAL SIZE 1  [employer] [id number] Left 1 Implanted  GLENOSPHERE REV SHOULDER 36 - [id number] Joint GLENOSPHERE REV SHOULDER 36  [employer] [id number] Left 1 Implanted  ANCHOR JUGGERKNOT WTAP NDL 2.9 - [id number] Anchor ANCHOR JUGGERKNOT WTAP NDL 2.9  [employer](ORTH,TRAU,BIO,SG) [id number] Left 1 Implanted  ANCHOR JUGGERKNOT WTAP NDL 2.9 - [id number] Anchor ANCHOR JUGGERKNOT WTAP NDL 2.9  [employer](ORTH,TRAU,BIO,SG)  Left 1 Implanted    DRAINS: None  CULTURES: None  COMPLICATIONS: none  DESCRIPTION OF PROCEDURE:  Patient was identified in the preoperative holding area.  Anesthesia  performed an interscalene nerve block after universal timeout was performed with nursing.  Ancef was given 1 hour prior to skin incision.    The surgical site was scrubbed with a chlorhexidine scrub brush and alcohol.  The patient was then prepped with chlorhexidine skin prep.  The patient was subsequently taken back to the operating room.  Anesthesia was induced.  He was transferred to the beachchair position.  All bony prominences were padded.  Final timeout was again performed.     The bony landmarks of the shoulder were marked with a marking pen. A delto-pectoral incision was made, extending up approximately 5 inches. The wound with then irrigated with dilute betadine. Cephalic vein was identified, and an protected. This was retracted medially. Subdeltoid and subpectoral lesions were released. Neurovascular structures were carefully protected. The Gelpi retractor was used to retract the deltoid and pectoralis major. A 1 cm release was performed on the upper pectoralis.   The deltoid was retracted laterally with a Brown humeral retractor.  The conjoined tendon was identified. The cleido-pectoral fascia was excised.  The axillary nerve was palpated and carefully protected throughout the procedure.  The biceps was not seen in the groove consistent with prior biceps tenodesis.  The bicipital groove was used for a landmark to establish rotator cuff interval. The subscap was tagged with a #2 FiberWire.  At this point the subscap was peeled off from the lesser tuberosity with care to avoid dissection distally in order to protect the axillary nerve.  Once the joint was exposed the proximal humerus was delivered with external rotation and extension of the arm.  There was a portion of the posterior rotator cuff intact with several transosseous sutures that needed to be removed from the humeral head bone.  Given that there was a previous biceps tenodesis procedure the native anatomic planes have been altered.  The  humerus was prepped initially by performing a humeral neck cut. This was done with the guide using 20 degrees of retroversion as a reference.  The head portion was removed.  A medullary sounding reamer was then used.  We subsequently placed our guidewire through the center of the humeral head using the reference guide.  This was a size 2.  Metaphyseal reamer was then used.  Finally the size 3 broach was malleted into place with excellent purchase.  A tonsil clamp was used to attempt to pull this out with very good purchase   Attention was then turned to the glenoid.  Posteriorly a large Darach retractor was used.  A 360 Degree release of the subscapularis and glenoid were done. The capsule was released from the humerus.    Glenoid retractors were placed posteriorly, superiorly behind the biceps tendon and anteriorly on the glenoid neck. A 360-degree release of the capsule was performed with cautery.  The triceps was released off the inferior tubercle of the glenoid. The axillary nerve was carefully protected with the surgeon's index finger, retracting it and using cautery.   A guidepin was placed through the glenoid guide. The guidepin was drilled until it exited the cortex. The guidepin was over drilled. Next, the glenoid was prepared with the reamer  down to cortical bone.  The central peg hole was totally within the scapular neck tested with the probe.  The baseplate was then placed screwed securely with good purchase in position and then secured with 4 screws. In each case, they were drilled and measured and the appropriate length screw placed with excellent rigid fixation of the baseplate.    A +3 liner was then used with the appropriate broach.  This was brought to just the level of the reduction but not completely reduced.  A +3 final poly was selected and impacted.    Appropriate tension was noted on the conjoined tendon and deltoid muscle.  Extension was stable, external and internal rotation as  well.  The subscap was pulled over but as this was not able to reach comfortably decision was made not to repair in order to prevent limited in external rotation.  The wound was then irrigated. Vancomycin powder was placed in the wound again for infection prevention.   The wound was then closed in layers with 0 Vicryl interrupted in the deep subcu followed by 2-0 Vicryl in the superficial subcu and 3-0 Monocryl for skin.  An Aquacel dressing was applied as well as an Naval architect.  A shoulder immobilizer was applied.  All counts were correct at the end of the case she was taken to the PACU without complication.         POSTOPERATIVE PLAN: She will follow-up with physical therapy postop.  I will see her back in 2 weeks for wound check.  We will obtain an AP x-ray in the PACU.  She will be discharged home.  Yevonne Pax, MD 10:00 AM

## 2022-02-15 ENCOUNTER — Encounter (HOSPITAL_COMMUNITY): Payer: Self-pay | Admitting: Orthopaedic Surgery

## 2022-02-16 ENCOUNTER — Encounter (HOSPITAL_COMMUNITY): Payer: Self-pay | Admitting: Orthopaedic Surgery

## 2022-02-20 ENCOUNTER — Encounter (HOSPITAL_BASED_OUTPATIENT_CLINIC_OR_DEPARTMENT_OTHER): Payer: Self-pay | Admitting: Physical Therapy

## 2022-02-20 ENCOUNTER — Ambulatory Visit (HOSPITAL_BASED_OUTPATIENT_CLINIC_OR_DEPARTMENT_OTHER): Payer: BC Managed Care – PPO | Attending: Orthopaedic Surgery | Admitting: Physical Therapy

## 2022-02-20 DIAGNOSIS — M6281 Muscle weakness (generalized): Secondary | ICD-10-CM | POA: Insufficient documentation

## 2022-02-20 DIAGNOSIS — M25512 Pain in left shoulder: Secondary | ICD-10-CM | POA: Diagnosis not present

## 2022-02-20 DIAGNOSIS — M12812 Other specific arthropathies, not elsewhere classified, left shoulder: Secondary | ICD-10-CM | POA: Insufficient documentation

## 2022-02-20 DIAGNOSIS — M25612 Stiffness of left shoulder, not elsewhere classified: Secondary | ICD-10-CM | POA: Insufficient documentation

## 2022-02-20 NOTE — Therapy (Signed)
OUTPATIENT PHYSICAL THERAPY SHOULDER EVALUATION   Patient Name: [name] MRN: [id number] DOB:07-25-1961, 61 y.o., female Today's Date: 02/20/2022   PT End of Session - 02/20/22 1213     Visit Number 1    Number of Visits 16    Date for PT Re-Evaluation 04/17/22    PT Start Time 1015    PT Stop Time 1058    PT Time Calculation (min) 43 min    Activity Tolerance Patient tolerated treatment well    Behavior During Therapy [facility] for tasks assessed/performed             Past Medical History:  Diagnosis Date   Arthritis    rheumatoid per pt   Past Surgical History:  Procedure Laterality Date   ABDOMINAL HYSTERECTOMY  2001   TAH/BSO fibroids   CATARACT EXTRACTION Right    COLONOSCOPY  04/27/2011   obstructed bile duct  2004   Dr Sharmaine Base   OOPHORECTOMY     BSO   REVERSE SHOULDER ARTHROPLASTY Left 02/14/2022   Procedure: LEFT REVERSE SHOULDER ARTHROPLASTY;  Surgeon: Vanetta Mulders, MD;  Location: [place];  Service: Orthopedics;  Laterality: Left;   ROTATOR CUFF REPAIR Bilateral 12/2010   Left and right   Patient Active Problem List   Diagnosis Date Noted   Rotator cuff arthropathy of left shoulder    Preop examination 12/30/2021   Arthritis of carpometacarpal [facility]) joint of left thumb 12/02/2021   Lumbar radiculopathy 05/29/2017   Greater trochanteric bursitis of left hip 05/29/2017   Contusion, hip and thigh, left, initial encounter 05/29/2017   Left hip pain 04/30/2017   Abdominal pain, LLQ 04/30/2017   Back pain 02/16/2015   Obesity (BMI 30-39.9) 06/09/2013   OSTEOARTHRITIS 12/15/2009   IMPAIRMENT, ONE EYE MODERATE, OTHER NOS 07/01/2007   SBO 68/07/5725   HERNIA, UMBILICAL 20/35/5974    PCP: Ann Held, DO  REFERRING PROVIDER: Vanetta Mulders, MD  REFERRING DIAG: M 12.812 - Rotator cuff arthropathy of left shoulder  THERAPY DIAG:  Acute pain of left shoulder  Stiffness of left shoulder, not elsewhere classified  Muscle weakness  (generalized)  Rationale for Evaluation and Treatment Rehabilitation  ONSET DATE: 02/14/2022  SUBJECTIVE:                                                                                                                                                                                      SUBJECTIVE STATEMENT: Pt presents to therapy today following a L TSA performed on 6/13. Prior to surgery, she had been having difficulty with overhead lifting, reaching behind head or back, and pain with most ADLs. Since surgery, she has been having  moderate pain over anterior shoulder, surgical incision, and through bicep with occasional "nerve-like" pain shooting down through anterior forearm. She notes adherence to sling protocol and also occasional swelling near the inferior incision site. Pain has been eased with medication and ice. Patient wants to return to full function and get back to her walking and gym program.  PERTINENT HISTORY: OA, bil rotator cuff repair  PAIN:  Are you having pain? Yes: NPRS scale: 6/10 Pain location: Anterior shoulder, bicep Pain description: Sharp, occasional nerve pain Aggravating factors: Movement, sleeping Relieving factors: Ice, medication  PRECAUTIONS: Shoulder  WEIGHT BEARING RESTRICTIONS No  FALLS:  Has patient fallen in last 6 months? No  LIVING ENVIRONMENT: Lives with: lives with their family and lives alone Lives in: House/apartment Stairs: No Has following equipment at home: None  OCCUPATION: [employer]  PLOF: Independent  PATIENT GOALS  Back to normal activity - walking, gym routine  OBJECTIVE:   DIAGNOSTIC FINDINGS:  Nothing post op   PATIENT SURVEYS:  FOTO 29% initial 70% expected in 15 visits   COGNITION:  Overall cognitive status: Within functional limits for tasks assessed     SENSATION: WFL  POSTURE:   UPPER EXTREMITY ROM:   Not tested at eval.  Passive ROM Right eval Left eval  Shoulder flexion  55  Shoulder extension     Shoulder abduction    Shoulder adduction    Shoulder internal rotation  Some pain getting to sling position   Shoulder external rotation  12  Elbow flexion    Elbow extension    Wrist flexion    Wrist extension    Wrist ulnar deviation    Wrist radial deviation    Wrist pronation    Wrist supination    (Blank rows = not tested)  UPPER EXTREMITY MMT:  Not indicated at eval.  MMT Right eval Left eval  Shoulder flexion    Shoulder extension    Shoulder abduction    Shoulder adduction    Shoulder internal rotation    Shoulder external rotation    Middle trapezius    Lower trapezius    Elbow flexion    Elbow extension    Wrist flexion    Wrist extension    Wrist ulnar deviation    Wrist radial deviation    Wrist pronation    Wrist supination    Grip strength (lbs)    (Blank rows = not tested) Nothing at this tim      TODAY'S TREATMENT:  Eval Wound dressing change  Access Code: [facility] URL: [email or web address] Date: 02/20/2022 Prepared by: Carolyne Littles  Exercises - Circular Shoulder Pendulum with Table Support  - 3 x daily - 7 x weekly - 3 sets - 10 reps - Seated Elbow Flexion and Extension AROM  - 1 x daily - 7 x weekly - 3 sets - 10 reps - Wrist Flexion Extension AROM with Fingers Curled and Palm Down  - 1 x daily - 7 x weekly - 3 sets - 10 reps   PATIENT EDUCATION: Education details: HEP, surgical protocol, therapy plan of care Person educated: Patient Education method: Explanation Education comprehension: verbalized understanding   HOME EXERCISE PROGRAM:  Access Code: [facility] URL: [email or web address] Date: 02/20/2022 Prepared by: Carolyne Littles  ASSESSMENT:  CLINICAL IMPRESSION: Patient is a 61 y.o. F who was seen today for physical therapy evaluation and treatment following L TSA. She presents with expected impairments in ROM, strength, and function. No indicators of infection present. Patient has good  rehab potential to return to full function under the care of skilled therapy.    OBJECTIVE IMPAIRMENTS decreased activity tolerance, decreased ROM, decreased strength, and impaired UE functional use.   ACTIVITY LIMITATIONS carrying, dressing, and reach over head  PARTICIPATION LIMITATIONS: community activity and occupation  PERSONAL FACTORS Age and Time since onset of injury/illness/exacerbation are also affecting patient's functional outcome.   REHAB POTENTIAL: Good  CLINICAL DECISION MAKING: Evolving/moderate complexity  EVALUATION COMPLEXITY: Moderate   GOALS: Goals reviewed with patient? Yes GOALS: Goals reviewed with patient? Yes   SHORT TERM GOALS: Target date: 03/13/2022   Pt will have 130 deg passive flexion ROM in supine. Baseline: Goal status: INITIAL   2.  Pt will have 30 deg passive ER ROM in supine. Baseline:  Goal status: INITIAL   3.  Pt will report pain below 3/10 NPS with PROM. Baseline:  Goal status: INITIAL   LONG TERM GOALS: Target date: 04/03/2022     Pt will demonstrate 130 deg active flexion and scaption in standing. Baseline:  Goal status: INITIAL   2.  Pt will demonstrate 30 deg active ER in sidelying. Baseline:  Goal status: INITIAL   3.  Pt will be able to activate deltoid and rotator cuff musculature isometrically without pain. Baseline:  Goal status: INITIAL     Goals set to meet criteria at week 3 and 6 to indicate protocol phase advancement. Additional goals for full return to function to be made based on progression with surgical protocol.   PLAN: PT FREQUENCY: 2x/week  PT DURATION: 6 weeks  PLANNED INTERVENTIONS: Therapeutic exercises, Therapeutic activity, Neuromuscular re-education, Balance training, Gait training, Patient/Family education, Joint mobilization, Aquatic Therapy, Dry Needling, Cryotherapy, Moist heat, and scar mobilization  PLAN FOR NEXT SESSION:  Progress as indicated per TSA protocol - PROM, pendulums,  active wrist/elbow ROM and strengthening   Carney Living, PT 02/20/2022, 12:24 PM

## 2022-02-21 ENCOUNTER — Encounter (HOSPITAL_BASED_OUTPATIENT_CLINIC_OR_DEPARTMENT_OTHER): Payer: Self-pay | Admitting: Physical Therapy

## 2022-02-27 ENCOUNTER — Ambulatory Visit (INDEPENDENT_AMBULATORY_CARE_PROVIDER_SITE_OTHER): Payer: BC Managed Care – PPO | Admitting: Orthopaedic Surgery

## 2022-02-27 ENCOUNTER — Ambulatory Visit (INDEPENDENT_AMBULATORY_CARE_PROVIDER_SITE_OTHER): Payer: BC Managed Care – PPO

## 2022-02-27 ENCOUNTER — Ambulatory Visit (HOSPITAL_BASED_OUTPATIENT_CLINIC_OR_DEPARTMENT_OTHER): Payer: BC Managed Care – PPO | Admitting: Physical Therapy

## 2022-02-27 ENCOUNTER — Telehealth: Payer: Self-pay

## 2022-02-27 ENCOUNTER — Encounter (HOSPITAL_BASED_OUTPATIENT_CLINIC_OR_DEPARTMENT_OTHER): Payer: Self-pay | Admitting: Physical Therapy

## 2022-02-27 DIAGNOSIS — M6281 Muscle weakness (generalized): Secondary | ICD-10-CM | POA: Diagnosis not present

## 2022-02-27 DIAGNOSIS — M25612 Stiffness of left shoulder, not elsewhere classified: Secondary | ICD-10-CM

## 2022-02-27 DIAGNOSIS — M19012 Primary osteoarthritis, left shoulder: Secondary | ICD-10-CM | POA: Diagnosis not present

## 2022-02-27 DIAGNOSIS — M25512 Pain in left shoulder: Secondary | ICD-10-CM | POA: Diagnosis not present

## 2022-02-27 DIAGNOSIS — Z9889 Other specified postprocedural states: Secondary | ICD-10-CM | POA: Diagnosis not present

## 2022-02-27 DIAGNOSIS — M12812 Other specific arthropathies, not elsewhere classified, left shoulder: Secondary | ICD-10-CM | POA: Diagnosis not present

## 2022-02-27 NOTE — Progress Notes (Signed)
Post Operative Evaluation    Procedure/Date of Surgery: Left reverse shoulder arthroplasty 02/14/22  Interval History:   Presents today for 2-week follow-up status post the above procedure.  He has been taking her aspirin as well as begun physical therapy.  She has not needed any pain medication for pain control.  Overall she feels much better.  She is continuing to experience some pain and stiffness in the right shoulder as well as right hip which is vascular.  She is now doing physical therapy several times a week.  She has begun passive range of motion.  She has been in a sling.   PMH/PSH/Family History/Social History/Meds/Allergies:    Past Medical History:  Diagnosis Date   Arthritis    rheumatoid per pt   Past Surgical History:  Procedure Laterality Date   ABDOMINAL HYSTERECTOMY  2001   TAH/BSO fibroids   CATARACT EXTRACTION Right    COLONOSCOPY  04/27/2011   obstructed bile duct  2004   Dr Micki Riley   OOPHORECTOMY     BSO   REVERSE SHOULDER ARTHROPLASTY Left 02/14/2022   Procedure: LEFT REVERSE SHOULDER ARTHROPLASTY;  Surgeon: Huel Cote, MD;  Location: [facility];  Service: Orthopedics;  Laterality: Left;   ROTATOR CUFF REPAIR Bilateral 12/2010   Left and right   Social History   Socioeconomic History   Marital status: Single    Spouse name: Not on file   Number of children: 1   Years of education: Not on file   Highest education level: Not on file  Occupational History   Occupation: MEAT CUTTER    Employer: [employer]  Tobacco Use   Smoking status: Never   Smokeless tobacco: Never  Vaping Use   Vaping Use: Never used  Substance and Sexual Activity   Alcohol use: Yes    Alcohol/week: 0.0 standard drinks of alcohol    Comment: Occas. wine   Drug use: No   Sexual activity: Not Currently    Partners: Male    Birth control/protection: Surgical    Comment: 1st intercourse 61 yo-More than 5 partners,hysterectomy  Other  Topics Concern   Not on file  Social History Narrative   Not on file   Social Determinants of Health   Financial Resource Strain: Not on file  Food Insecurity: Not on file  Transportation Needs: Not on file  Physical Activity: Not on file  Stress: Not on file  Social Connections: Not on file   Family History  Problem Relation Age of Onset   Heart disease Mother 76       MI   Stroke Mother    Hypertension Mother    Arthritis Mother    Pulmonary embolism Mother    Prostate cancer Father    Hypertension Father    Arthritis Sister    Colon cancer Neg Hx    Colon polyps Neg Hx    Esophageal cancer Neg Hx    Rectal cancer Neg Hx    Stomach cancer Neg Hx    No Known Allergies Current Outpatient Medications  Medication Sig Dispense Refill   aspirin EC 325 MG tablet Take 1 tablet (325 mg total) by mouth daily. 30 tablet 0   Cholecalciferol (VITAMIN D PO) Take 1 tablet by mouth 2 (two) times a week.     cyclobenzaprine (FLEXERIL)  5 MG tablet Take 1 tablet (5 mg total) by mouth at bedtime. (Patient taking differently: Take 5 mg by mouth daily as needed for muscle spasms.) 30 tablet 0   LORazepam (ATIVAN) 1 MG tablet Take 1 tablet (1 mg total) by mouth every 8 (eight) hours. 2 tablet 0   methocarbamol (ROBAXIN) 500 MG tablet Take 1 tablet (500 mg total) by mouth 2 (two) times daily. 20 tablet 0   methylPREDNISolone (MEDROL) 4 MG tablet Take as directed 21 tablet 0   oxyCODONE (OXY IR/ROXICODONE) 5 MG immediate release tablet Take 1 tablet (5 mg total) by mouth every 4 (four) hours as needed (severe pain). 20 tablet 0   traMADol (ULTRAM) 50 MG tablet Take 1 tablet (50 mg total) by mouth every 6 (six) hours as needed. 30 tablet 0   No current facility-administered medications for this visit.   No results found.  Review of Systems:   A ROS was performed including pertinent positives and negatives as documented in the HPI.   Musculoskeletal Exam:    There were no vitals taken for  this visit.  Left shoulder incision is very well-appearing.  No erythema or drainage.  She is able to flex and extend at the left elbow.  In the supine position she is able to passively forward elevate to 90 degrees with 20 degrees of external rotation at the side.  Sensation is intact in all distributions.  Fires all heads of her deltoids.  2+ radial pulse  Imaging:    X-ray left shoulder 3 views: Status post reverse shoulder arthroplasty with of any evidence of complication  I personally reviewed and interpreted the radiographs.   Assessment:   61 year old female 2 weeks status post left reverse shoulder arthroplasty overall doing extremely well.  She will continue to advance per protocol.  I have discussed that I would like her to remain in the sling at night for an additional 2 weeks.  At that time she may wean.  She will begin active and active assisted range of motion at this time.  Plan :    -Return to clinic in 4 weeks      I personally saw and evaluated the patient, and participated in the management and treatment plan.  Huel Cote, MD Attending Physician, Orthopedic Surgery  This document was dictated using Dragon voice recognition software. A reasonable attempt at proof reading has been made to minimize errors.

## 2022-03-06 ENCOUNTER — Encounter (HOSPITAL_BASED_OUTPATIENT_CLINIC_OR_DEPARTMENT_OTHER): Payer: Self-pay | Admitting: Physical Therapy

## 2022-03-06 ENCOUNTER — Ambulatory Visit (HOSPITAL_BASED_OUTPATIENT_CLINIC_OR_DEPARTMENT_OTHER): Payer: BC Managed Care – PPO | Attending: Orthopaedic Surgery | Admitting: Physical Therapy

## 2022-03-06 DIAGNOSIS — M25612 Stiffness of left shoulder, not elsewhere classified: Secondary | ICD-10-CM | POA: Insufficient documentation

## 2022-03-06 DIAGNOSIS — M25512 Pain in left shoulder: Secondary | ICD-10-CM | POA: Insufficient documentation

## 2022-03-06 DIAGNOSIS — M6281 Muscle weakness (generalized): Secondary | ICD-10-CM | POA: Insufficient documentation

## 2022-03-06 NOTE — Therapy (Signed)
OUTPATIENT PHYSICAL THERAPY SHOULDER EVALUATION   Patient Name: [name] MRN: [id number] DOB:1961-08-15, 61 y.o., female Today's Date: 03/06/2022   PT End of Session - 03/06/22 1007     Visit Number 3    Number of Visits 16    Date for PT Re-Evaluation 04/17/22    PT Start Time 1010    PT Stop Time 1045    PT Time Calculation (min) 35 min    Activity Tolerance Patient tolerated treatment well    Behavior During Therapy [facility] for tasks assessed/performed             Past Medical History:  Diagnosis Date   Arthritis    rheumatoid per pt   Past Surgical History:  Procedure Laterality Date   ABDOMINAL HYSTERECTOMY  2001   TAH/BSO fibroids   CATARACT EXTRACTION Right    COLONOSCOPY  04/27/2011   obstructed bile duct  2004   Dr Sharmaine Base   OOPHORECTOMY     BSO   REVERSE SHOULDER ARTHROPLASTY Left 02/14/2022   Procedure: LEFT REVERSE SHOULDER ARTHROPLASTY;  Surgeon: Vanetta Mulders, MD;  Location: [place];  Service: Orthopedics;  Laterality: Left;   ROTATOR CUFF REPAIR Bilateral 12/2010   Left and right   Patient Active Problem List   Diagnosis Date Noted   Rotator cuff arthropathy of left shoulder    Preop examination 12/30/2021   Arthritis of carpometacarpal [facility]) joint of left thumb 12/02/2021   Lumbar radiculopathy 05/29/2017   Greater trochanteric bursitis of left hip 05/29/2017   Contusion, hip and thigh, left, initial encounter 05/29/2017   Left hip pain 04/30/2017   Abdominal pain, LLQ 04/30/2017   Back pain 02/16/2015   Obesity (BMI 30-39.9) 06/09/2013   OSTEOARTHRITIS 12/15/2009   IMPAIRMENT, ONE EYE MODERATE, OTHER NOS 07/01/2007   SBO 15/17/6160   HERNIA, UMBILICAL 73/71/0626    PCP: Ann Held, DO  REFERRING PROVIDER: Vanetta Mulders, MD  REFERRING DIAG: M 12.812 - Rotator cuff arthropathy of left shoulder  THERAPY DIAG:  Stiffness of left shoulder, not elsewhere classified  Acute pain of left shoulder  Muscle weakness  (generalized)  Rationale for Evaluation and Treatment Rehabilitation  ONSET DATE: 02/14/2022  SUBJECTIVE:                                                                                                                                                                                      SUBJECTIVE STATEMENT: Not really hurting, just stiff. Neck is doing ok.   PERTINENT HISTORY: OA, bil rotator cuff repair  PAIN:  Are you having pain? Yes: NPRS scale: 3/10 Pain location: Anterior shoulder, bicep Pain description: Sharp, occasional nerve  pain Aggravating factors: Movement, sleeping Relieving factors: Ice, medication  PRECAUTIONS: Shoulder  WEIGHT BEARING RESTRICTIONS No  FALLS:  Has patient fallen in last 6 months? No  LIVING ENVIRONMENT: Lives with: lives with their family and lives alone Lives in: House/apartment Stairs: No Has following equipment at home: None  OCCUPATION: [employer]  PLOF: Independent  PATIENT GOALS  Back to normal activity - walking, gym routine  OBJECTIVE:   DIAGNOSTIC FINDINGS:  Nothing post op   PATIENT SURVEYS:  FOTO 29% initial 70% expected in 15 visits   COGNITION:  Overall cognitive status: Within functional limits for tasks assessed     SENSATION: WFL  POSTURE:   UPPER EXTREMITY ROM:   Not tested at eval.  Passive ROM Right eval Left eval Left 6/26  Shoulder flexion  55 85 with guarding   Shoulder extension     Shoulder abduction     Shoulder adduction     Shoulder internal rotation  Some pain getting to sling position    Shoulder external rotation  12 Not stretched but able to move to 5 degrees   Elbow flexion     Elbow extension     Wrist flexion     Wrist extension     Wrist ulnar deviation     Wrist radial deviation     Wrist pronation     Wrist supination     (Blank rows = not tested)  UPPER EXTREMITY MMT:  Not indicated at eval.  MMT Right eval Left eval  Shoulder flexion    Shoulder extension     Shoulder abduction    Shoulder adduction    Shoulder internal rotation    Shoulder external rotation    Middle trapezius    Lower trapezius    Elbow flexion    Elbow extension    Wrist flexion    Wrist extension    Wrist ulnar deviation    Wrist radial deviation    Wrist pronation    Wrist supination    Grip strength (lbs)    (Blank rows = not tested)     TODAY'S TREATMENT:  7/3 MANUAL: STM to upper trap, PROM per limits; PT resisted gentle Er/IR isometrics at neutral Seated scap retraction  Started adding elevation in scapular plane- pt achieved 35 deg with good form Seated scaption roller on table Upper trap & levator stretch   6/26 Manual: PROM into flexion; trigger point release to upper trap; gentle STM to anterior shoulder   Elbow flexion 3x10 Scap retraction 3x10 mod cuing to let her shoulder loose and not hike her sholders   Pendulums x10 in all directions with cuing to use her hips.    PATIENT EDUCATION: Education details: HEP, surgical protocol, therapy plan of care Person educated: Patient Education method: Explanation Education comprehension: verbalized understanding   HOME EXERCISE PROGRAM:  Access Code: [facility] URL: [email or web address]   ASSESSMENT:  CLINICAL IMPRESSION: Pt continues to progress well. Compensatory patterns evident around 35 deg of elevation in scapular plane. Advised of importance of resting posture and scap retraction.   OBJECTIVE IMPAIRMENTS decreased activity tolerance, decreased ROM, decreased strength, and impaired UE functional use.   ACTIVITY LIMITATIONS carrying, dressing, and reach over head  PARTICIPATION LIMITATIONS: community activity and occupation  PERSONAL FACTORS Age and Time since onset of injury/illness/exacerbation are also affecting patient's functional outcome.   REHAB POTENTIAL: Good  CLINICAL DECISION MAKING: Evolving/moderate complexity  EVALUATION COMPLEXITY:  Moderate   GOALS: Goals reviewed with patient? Yes GOALS: Goals  reviewed with patient? Yes   SHORT TERM GOALS: Target date: 03/13/2022   Pt will have 130 deg passive flexion ROM in supine. Baseline: Goal status: INITIAL   2.  Pt will have 30 deg passive ER ROM in supine. Baseline:  Goal status: INITIAL   3.  Pt will report pain below 3/10 NPS with PROM. Baseline:  Goal status: INITIAL   LONG TERM GOALS: Target date: 04/03/2022     Pt will demonstrate 130 deg active flexion and scaption in standing. Baseline:  Goal status: INITIAL   2.  Pt will demonstrate 30 deg active ER in sidelying. Baseline:  Goal status: INITIAL   3.  Pt will be able to activate deltoid and rotator cuff musculature isometrically without pain. Baseline:  Goal status: INITIAL     Goals set to meet criteria at week 3 and 6 to indicate protocol phase advancement. Additional goals for full return to function to be made based on progression with surgical protocol.   PLAN: PT FREQUENCY: 2x/week  PT DURATION: 6 weeks  PLANNED INTERVENTIONS: Therapeutic exercises, Therapeutic activity, Neuromuscular re-education, Balance training, Gait training, Patient/Family education, Joint mobilization, Aquatic Therapy, Dry Needling, Cryotherapy, Moist heat, and scar mobilization  PLAN FOR NEXT SESSION:  Progress as indicated per TSA protocol - PROM, pendulums, active wrist/elbow ROM and strengthening   Serafin [place] C. Salvador Coupe PT, DPT 03/06/22 10:48 AM

## 2022-03-13 ENCOUNTER — Encounter (HOSPITAL_BASED_OUTPATIENT_CLINIC_OR_DEPARTMENT_OTHER): Payer: Self-pay | Admitting: Physical Therapy

## 2022-03-13 ENCOUNTER — Ambulatory Visit (HOSPITAL_BASED_OUTPATIENT_CLINIC_OR_DEPARTMENT_OTHER): Payer: BC Managed Care – PPO | Admitting: Physical Therapy

## 2022-03-13 DIAGNOSIS — M25612 Stiffness of left shoulder, not elsewhere classified: Secondary | ICD-10-CM | POA: Diagnosis not present

## 2022-03-13 DIAGNOSIS — M6281 Muscle weakness (generalized): Secondary | ICD-10-CM

## 2022-03-13 DIAGNOSIS — M25512 Pain in left shoulder: Secondary | ICD-10-CM | POA: Diagnosis not present

## 2022-03-13 NOTE — Therapy (Signed)
OUTPATIENT PHYSICAL THERAPY SHOULDER EVALUATION   Patient Name: [name] MRN: [id number] DOB:[birth year], 61 y.o., female Today's Date: 03/13/2022   PT End of Session - 03/13/22 0935     Visit Number 4    Number of Visits 16    Date for PT Re-Evaluation 04/17/22    PT Start Time 0846    PT Stop Time 0925    PT Time Calculation (min) 39 min    Activity Tolerance Patient tolerated treatment well    Behavior During Therapy [facility] for tasks assessed/performed             Past Medical History:  Diagnosis Date   Arthritis    rheumatoid per pt   Past Surgical History:  Procedure Laterality Date   ABDOMINAL HYSTERECTOMY  2001   TAH/BSO fibroids   CATARACT EXTRACTION Right    COLONOSCOPY  04/27/2011   obstructed bile duct  2004   Dr Sharmaine Base   OOPHORECTOMY     BSO   REVERSE SHOULDER ARTHROPLASTY Left 02/14/2022   Procedure: LEFT REVERSE SHOULDER ARTHROPLASTY;  Surgeon: Vanetta Mulders, MD;  Location: [place];  Service: Orthopedics;  Laterality: Left;   ROTATOR CUFF REPAIR Bilateral 12/2010   Left and right   Patient Active Problem List   Diagnosis Date Noted   Rotator cuff arthropathy of left shoulder    Preop examination 12/30/2021   Arthritis of carpometacarpal [facility]) joint of left thumb 12/02/2021   Lumbar radiculopathy 05/29/2017   Greater trochanteric bursitis of left hip 05/29/2017   Contusion, hip and thigh, left, initial encounter 05/29/2017   Left hip pain 04/30/2017   Abdominal pain, LLQ 04/30/2017   Back pain 02/16/2015   Obesity (BMI 30-39.9) 06/09/2013   OSTEOARTHRITIS 12/15/2009   IMPAIRMENT, ONE EYE MODERATE, OTHER NOS 07/01/2007   SBO 25/42/7062   HERNIA, UMBILICAL 37/62/8315     PCP: Ann Held, DO   REFERRING PROVIDER: Vanetta Mulders, MD   REFERRING DIAG: M 12.812 - Rotator cuff arthropathy of left shoulder   THERAPY DIAG:  No diagnosis found.   Rationale for Evaluation and Treatment Rehabilitation   ONSET DATE: 02/14/2022    SUBJECTIVE:                                                                                                                                                                                       SUBJECTIVE STATEMENT: Pt states she is making steady progress with shoulder. She had some mild stiffness and soreness following the prior session. She has been walking with light weights recently and icing her shoulder.   PERTINENT HISTORY: OA, bil rotator cuff repair   PAIN:  Are you having pain? Yes: NPRS scale: 3/10 Pain location: Anterior shoulder, bicep Pain description: Sharp, occasional nerve pain Aggravating factors: Movement, sleeping Relieving factors: Ice, medication   PRECAUTIONS: Shoulder   WEIGHT BEARING RESTRICTIONS No   FALLS:  Has patient fallen in last 6 months? No   LIVING ENVIRONMENT: Lives with: lives with their family and lives alone Lives in: House/apartment Stairs: No Has following equipment at home: None   OCCUPATION: [employer]   PLOF: Independent   PATIENT GOALS  Back to normal activity - walking, gym routine   OBJECTIVE:    DIAGNOSTIC FINDINGS:  Nothing post op    PATIENT SURVEYS:  FOTO 29% initial 70% expected in 15 visits    COGNITION:           Overall cognitive status: Within functional limits for tasks assessed                                  SENSATION: WFL   POSTURE:     UPPER EXTREMITY ROM:    Not tested at eval.   Passive ROM Right eval Left eval Left 6/26  Shoulder flexion   55 85 with guarding   Shoulder extension        Shoulder abduction        Shoulder adduction        Shoulder internal rotation   Some pain getting to sling position     Shoulder external rotation   12 Not stretched but able to move to 5 degrees   Elbow flexion        Elbow extension        Wrist flexion        Wrist extension        Wrist ulnar deviation        Wrist radial deviation        Wrist pronation        Wrist supination        (Blank  rows = not tested)   UPPER EXTREMITY MMT:   Not indicated at eval.   MMT Right eval Left eval  Shoulder flexion      Shoulder extension      Shoulder abduction      Shoulder adduction      Shoulder internal rotation      Shoulder external rotation      Middle trapezius      Lower trapezius      Elbow flexion      Elbow extension      Wrist flexion      Wrist extension      Wrist ulnar deviation      Wrist radial deviation      Wrist pronation      Wrist supination      Grip strength (lbs)      (Blank rows = not tested)                 TODAY'S TREATMENT:    7/10 Warm-up: Pulleys AAROM, 2 min Manual: PROM flexion, abd, ER per protocol limits w/ Grade I/II AP mobs Supine serratus punch, AAROM 2x10 w/1lb wand Seated flexion rollout x15 Wall isometrics x15 each - flex, abd, ext   7/3 MANUAL: STM to upper trap, PROM per limits; PT resisted gentle Er/IR isometrics at neutral Seated scap retraction            Started adding elevation in scapular  plane- pt achieved 35 deg with good form Seated scaption roller on table Upper trap & levator stretch     6/26 Manual: PROM into flexion; trigger point release to upper trap; gentle STM to anterior shoulder    Elbow flexion 3x10 Scap retraction 3x10 mod cuing to let her shoulder loose and not hike her sholders    Pendulums x10 in all directions with cuing to use her hips.      PATIENT EDUCATION: Education details: HEP, surgical protocol, therapy plan of care Person educated: Patient Education method: Explanation Education comprehension: verbalized understanding     HOME EXERCISE PROGRAM:   Access Code: [facility] URL: [email or web address]     ASSESSMENT:   CLINICAL IMPRESSION: Pt continues to progress well - she shows improvement with passive range and reports a decrease in pain. She tolerates exercises well with some mild soreness at end of session. Pt was provided education on home exercises and  monitoring her walking with weights. She will continue to benefit from skilled PT to progress her per protocol as tolerated.    OBJECTIVE IMPAIRMENTS decreased activity tolerance, decreased ROM, decreased strength, and impaired UE functional use.    ACTIVITY LIMITATIONS carrying, dressing, and reach over head   PARTICIPATION LIMITATIONS: community activity and occupation   PERSONAL FACTORS Age and Time since onset of injury/illness/exacerbation are also affecting patient's functional outcome.    REHAB POTENTIAL: Good   CLINICAL DECISION MAKING: Evolving/moderate complexity   EVALUATION COMPLEXITY: Moderate     GOALS: Goals reviewed with patient? Yes GOALS: Goals reviewed with patient? Yes   SHORT TERM GOALS: Target date: 03/13/2022   Pt will have 130 deg passive flexion ROM in supine. Baseline: Goal status: In progress - 100deg on 7/10   2.  Pt will have 30 deg passive ER ROM in supine. Baseline:  Goal status: Achieved 7/10   3.  Pt will report pain below 3/10 NPS with PROM. Baseline:  Goal status: Achieved 7/10   LONG TERM GOALS: Target date: 04/03/2022     Pt will demonstrate 130 deg active flexion and scaption in standing. Baseline:  Goal status: INITIAL   2.  Pt will demonstrate 30 deg active ER in sidelying. Baseline:  Goal status: INITIAL   3.  Pt will be able to activate deltoid and rotator cuff musculature isometrically without pain. Baseline:  Goal status: INITIAL     Goals set to meet criteria at week 3 and 6 to indicate protocol phase advancement. Additional goals for full return to function to be made based on progression with surgical protocol.   PLAN: PT FREQUENCY: 2x/week   PT DURATION: 6 weeks   PLANNED INTERVENTIONS: Therapeutic exercises, Therapeutic activity, Neuromuscular re-education, Balance training, Gait training, Patient/Family education, Joint mobilization, Aquatic Therapy, Dry Needling, Cryotherapy, Moist heat, and scar mobilization    PLAN FOR NEXT SESSION:  Progress as indicated per TSA protocol - Continue PROM and isometrics, consider AAROM as tolerated.     Carolyne Littles PT DPT  03/13/22 10:53 AM  Juliane Lack Hamlet SPT  03/13/2022  During this treatment session, the therapist was present, participating in and directing the treatment.

## 2022-03-20 ENCOUNTER — Encounter (HOSPITAL_BASED_OUTPATIENT_CLINIC_OR_DEPARTMENT_OTHER): Payer: Self-pay | Admitting: Physical Therapy

## 2022-03-20 ENCOUNTER — Telehealth: Payer: Self-pay | Admitting: Orthopaedic Surgery

## 2022-03-20 ENCOUNTER — Ambulatory Visit (HOSPITAL_BASED_OUTPATIENT_CLINIC_OR_DEPARTMENT_OTHER): Payer: BC Managed Care – PPO | Admitting: Physical Therapy

## 2022-03-20 DIAGNOSIS — M6281 Muscle weakness (generalized): Secondary | ICD-10-CM

## 2022-03-20 DIAGNOSIS — M25612 Stiffness of left shoulder, not elsewhere classified: Secondary | ICD-10-CM | POA: Diagnosis not present

## 2022-03-20 DIAGNOSIS — M25512 Pain in left shoulder: Secondary | ICD-10-CM | POA: Diagnosis not present

## 2022-03-20 NOTE — Telephone Encounter (Signed)
Patient paid $25.00 form fee for [employer] ([place])

## 2022-03-20 NOTE — Therapy (Addendum)
OUTPATIENT PHYSICAL THERAPY SHOULDER TREATMENT   Patient Name: [name] MRN: [id number] DOB:[birth year], 61 y.o., female Today's Date: 03/20/2022   PT End of Session - 03/20/22 1008     Visit Number 5    Number of Visits 16    Date for PT Re-Evaluation 04/17/22    PT Start Time 0848    PT Stop Time 0928    PT Time Calculation (min) 40 min    Activity Tolerance Patient tolerated treatment well    Behavior During Therapy [facility] for tasks assessed/performed              Past Medical History:  Diagnosis Date   Arthritis    rheumatoid per pt   Past Surgical History:  Procedure Laterality Date   ABDOMINAL HYSTERECTOMY  2001   TAH/BSO fibroids   CATARACT EXTRACTION Right    COLONOSCOPY  04/27/2011   obstructed bile duct  2004   Dr Sharmaine Base   OOPHORECTOMY     BSO   REVERSE SHOULDER ARTHROPLASTY Left 02/14/2022   Procedure: LEFT REVERSE SHOULDER ARTHROPLASTY;  Surgeon: Vanetta Mulders, MD;  Location: [place];  Service: Orthopedics;  Laterality: Left;   ROTATOR CUFF REPAIR Bilateral 12/2010   Left and right   Patient Active Problem List   Diagnosis Date Noted   Rotator cuff arthropathy of left shoulder    Preop examination 12/30/2021   Arthritis of carpometacarpal [facility]) joint of left thumb 12/02/2021   Lumbar radiculopathy 05/29/2017   Greater trochanteric bursitis of left hip 05/29/2017   Contusion, hip and thigh, left, initial encounter 05/29/2017   Left hip pain 04/30/2017   Abdominal pain, LLQ 04/30/2017   Back pain 02/16/2015   Obesity (BMI 30-39.9) 06/09/2013   OSTEOARTHRITIS 12/15/2009   IMPAIRMENT, ONE EYE MODERATE, OTHER NOS 07/01/2007   SBO 19/37/9024   HERNIA, UMBILICAL 09/73/5329     PCP: Ann Held, DO   REFERRING PROVIDER: Vanetta Mulders, MD   REFERRING DIAG: M 12.812 - Rotator cuff arthropathy of left shoulder   THERAPY DIAG:  No diagnosis found.   Rationale for Evaluation and Treatment Rehabilitation   ONSET DATE: 02/14/2022    SUBJECTIVE:                                                                                                                                                                                       SUBJECTIVE STATEMENT: Pt states she is making good progress with her shoulder - she reports good function at home at normal countertop height. She was a little stiff following the prior session but loosened up with exercises. She denies pain today.   PERTINENT HISTORY: OA,  bil rotator cuff repair   PAIN:  Are you having pain? Yes: NPRS scale: 0/10  7/17 Pain location: Anterior shoulder, bicep Pain description: Sharp, occasional nerve pain Aggravating factors: Movement, sleeping Relieving factors: Ice, medication   PRECAUTIONS: Shoulder   WEIGHT BEARING RESTRICTIONS No   FALLS:  Has patient fallen in last 6 months? No   LIVING ENVIRONMENT: Lives with: lives with their family and lives alone Lives in: House/apartment Stairs: No Has following equipment at home: None   OCCUPATION: [employer]   PLOF: Independent   PATIENT GOALS  Back to normal activity - walking, gym routine   OBJECTIVE:    DIAGNOSTIC FINDINGS:  Nothing post op    PATIENT SURVEYS:  FOTO 29% initial 70% expected in 15 visits    COGNITION:           Overall cognitive status: Within functional limits for tasks assessed                                  SENSATION: WFL   POSTURE:     UPPER EXTREMITY ROM:    Not tested at eval.   Passive ROM Right eval Left eval Left 6/26 Left 7/17  Shoulder flexion   55 85 with guarding  110  Shoulder extension         Shoulder abduction         Shoulder adduction         Shoulder internal rotation   Some pain getting to sling position      Shoulder external rotation   12 Not stretched but able to move to 5 degrees  20  Elbow flexion         Elbow extension         Wrist flexion         Wrist extension         Wrist ulnar deviation         Wrist radial deviation          Wrist pronation         Wrist supination         (Blank rows = not tested)                  TODAY'S TREATMENT:   7/17 Warm-up: Pulleys AAROM, 2 min Manual: PROM flexion, abd, ER per protocol limits w/ Grade I/II AP mobs Supine wand press AAROM, x15 with 1lb wand Supine wand flexion, ER AAROM, 2x10 each 1lb wand Wall isometrics - flex, abd, ext x15 each Supine serratus press - x15, PT-assisted AAROM  7/10 Warm-up: Pulleys AAROM, 2 min Manual: PROM flexion, abd, ER per protocol limits w/ Grade I/II AP mobs Supine serratus punch, AAROM 2x10 w/1lb wand Seated flexion rollout x15 Wall isometrics x15 each - flex, abd, ext   7/3 MANUAL: STM to upper trap, PROM per limits; PT resisted gentle Er/IR isometrics at neutral Seated scap retraction            Started adding elevation in scapular plane- pt achieved 35 deg with good form Seated scaption roller on table Upper trap & levator stretch       PATIENT EDUCATION: Education details: HEP, surgical protocol, therapy plan of care Person educated: Patient Education method: Explanation Education comprehension: verbalized understanding     HOME EXERCISE PROGRAM:   Access Code: [facility] URL: [email or web address]     ASSESSMENT:   CLINICAL IMPRESSION: Patient is  making good progress. Her passive range has improved steadily and she is able to activate all deltoid heads with submax isometrics with minimal pain. She reports a decrease in joint stiffness following today's session. She will continue to benefit from skilled therapy to progress per protocol to return her to full function.   OBJECTIVE IMPAIRMENTS decreased activity tolerance, decreased ROM, decreased strength, and impaired UE functional use.    ACTIVITY LIMITATIONS carrying, dressing, and reach over head   PARTICIPATION LIMITATIONS: community activity and occupation   PERSONAL FACTORS Age and Time since onset of injury/illness/exacerbation are  also affecting patient's functional outcome.    REHAB POTENTIAL: Good   CLINICAL DECISION MAKING: Evolving/moderate complexity   EVALUATION COMPLEXITY: Moderate     GOALS: Goals reviewed with patient? Yes GOALS: Goals reviewed with patient? Yes   SHORT TERM GOALS: Target date: 03/13/2022   Pt will have 130 deg passive flexion ROM in supine. Baseline: Goal status: In progress - 100deg on 7/10   2.  Pt will have 30 deg passive ER ROM in supine. Baseline:  Goal status: Achieved 7/10   3.  Pt will report pain below 3/10 NPS with PROM. Baseline:  Goal status: Achieved 7/10   LONG TERM GOALS: Target date: 04/03/2022     Pt will demonstrate 130 deg active flexion and scaption in standing. Baseline:  Goal status: INITIAL   2.  Pt will demonstrate 30 deg active ER in sidelying. Baseline:  Goal status: INITIAL   3.  Pt will be able to activate deltoid and rotator cuff musculature isometrically without pain. Baseline:  Goal status: INITIAL     Goals set to meet criteria at week 3 and 6 to indicate protocol phase advancement. Additional goals for full return to function to be made based on progression with surgical protocol.   PLAN: PT FREQUENCY: 2x/week   PT DURATION: 6 weeks   PLANNED INTERVENTIONS: Therapeutic exercises, Therapeutic activity, Neuromuscular re-education, Balance training, Gait training, Patient/Family education, Joint mobilization, Aquatic Therapy, Dry Needling, Cryotherapy, Moist heat, and scar mobilization   PLAN FOR NEXT SESSION:  Progress as indicated per TSA protocol - Continue PROM and isometrics, continue AAROM. Incorporate periscap AROM and resisted exercises as tolerated.      Mathew Hamlet SPT   During this treatment session, the therapist was present, participating in and directing the treatment.   Selinda Michaels III PT, DPT 03/20/22 2:05 PM

## 2022-03-27 ENCOUNTER — Ambulatory Visit (INDEPENDENT_AMBULATORY_CARE_PROVIDER_SITE_OTHER): Payer: BC Managed Care – PPO | Admitting: Orthopaedic Surgery

## 2022-03-27 ENCOUNTER — Ambulatory Visit (HOSPITAL_BASED_OUTPATIENT_CLINIC_OR_DEPARTMENT_OTHER): Payer: BC Managed Care – PPO | Admitting: Physical Therapy

## 2022-03-27 ENCOUNTER — Encounter (HOSPITAL_BASED_OUTPATIENT_CLINIC_OR_DEPARTMENT_OTHER): Payer: Self-pay | Admitting: Physical Therapy

## 2022-03-27 DIAGNOSIS — M25512 Pain in left shoulder: Secondary | ICD-10-CM | POA: Diagnosis not present

## 2022-03-27 DIAGNOSIS — M25612 Stiffness of left shoulder, not elsewhere classified: Secondary | ICD-10-CM | POA: Diagnosis not present

## 2022-03-27 DIAGNOSIS — Z9889 Other specified postprocedural states: Secondary | ICD-10-CM

## 2022-03-27 DIAGNOSIS — M6281 Muscle weakness (generalized): Secondary | ICD-10-CM

## 2022-03-27 NOTE — Progress Notes (Signed)
Post Operative Evaluation    Procedure/Date of Surgery: Left reverse shoulder arthroplasty 02/14/22  Interval History:   Presents today for follow-up of her left reverse shoulder arthroplasty overall doing extremely well.  At today's visit she has no pain.  Range of motion is improving dramatically.  She continues to work with physical therapy.   PMH/PSH/Family History/Social History/Meds/Allergies:    Past Medical History:  Diagnosis Date   Arthritis    rheumatoid per pt   Past Surgical History:  Procedure Laterality Date   ABDOMINAL HYSTERECTOMY  2001   TAH/BSO fibroids   CATARACT EXTRACTION Right    COLONOSCOPY  04/27/2011   obstructed bile duct  2004   Dr Sharmaine Base   OOPHORECTOMY     BSO   REVERSE SHOULDER ARTHROPLASTY Left 02/14/2022   Procedure: LEFT REVERSE SHOULDER ARTHROPLASTY;  Surgeon: Vanetta Mulders, MD;  Location: [place];  Service: Orthopedics;  Laterality: Left;   ROTATOR CUFF REPAIR Bilateral 12/2010   Left and right   Social History   Socioeconomic History   Marital status: Single    Spouse name: Not on file   Number of children: 1   Years of education: Not on file   Highest education level: Not on file  Occupational History   Occupation: MEAT CUTTER    Employer: [employer]  Tobacco Use   Smoking status: Never   Smokeless tobacco: Never  Vaping Use   Vaping Use: Never used  Substance and Sexual Activity   Alcohol use: Yes    Alcohol/week: 0.0 standard drinks of alcohol    Comment: Occas. wine   Drug use: No   Sexual activity: Not Currently    Partners: Male    Birth control/protection: Surgical    Comment: 1st intercourse 60 yo-More than 5 partners,hysterectomy  Other Topics Concern   Not on file  Social History Narrative   Not on file   Social Determinants of Health   Financial Resource Strain: Not on file  Food Insecurity: Not on file  Transportation Needs: Not on file  Physical Activity: Not on  file  Stress: Not on file  Social Connections: Not on file   Family History  Problem Relation Age of Onset   Heart disease Mother 39       MI   Stroke Mother    Hypertension Mother    Arthritis Mother    Pulmonary embolism Mother    Prostate cancer Father    Hypertension Father    Arthritis Sister    Colon cancer Neg Hx    Colon polyps Neg Hx    Esophageal cancer Neg Hx    Rectal cancer Neg Hx    Stomach cancer Neg Hx    No Known Allergies Current Outpatient Medications  Medication Sig Dispense Refill   aspirin EC 325 MG tablet Take 1 tablet (325 mg total) by mouth daily. 30 tablet 0   Cholecalciferol (VITAMIN D PO) Take 1 tablet by mouth 2 (two) times a week.     cyclobenzaprine (FLEXERIL) 5 MG tablet Take 1 tablet (5 mg total) by mouth at bedtime. (Patient taking differently: Take 5 mg by mouth daily as needed for muscle spasms.) 30 tablet 0   LORazepam (ATIVAN) 1 MG tablet Take 1 tablet (1 mg total) by mouth every 8 (eight) hours. 2 tablet  0   methocarbamol (ROBAXIN) 500 MG tablet Take 1 tablet (500 mg total) by mouth 2 (two) times daily. 20 tablet 0   methylPREDNISolone (MEDROL) 4 MG tablet Take as directed 21 tablet 0   oxyCODONE (OXY IR/ROXICODONE) 5 MG immediate release tablet Take 1 tablet (5 mg total) by mouth every 4 (four) hours as needed (severe pain). 20 tablet 0   traMADol (ULTRAM) 50 MG tablet Take 1 tablet (50 mg total) by mouth every 6 (six) hours as needed. 30 tablet 0   No current facility-administered medications for this visit.   No results found.  Review of Systems:   A ROS was performed including pertinent positives and negatives as documented in the HPI.   Musculoskeletal Exam:    There were no vitals taken for this visit.  Left shoulder incision is healed.  No erythema or drainage.  She is able to flex and extend at the left elbow.  While standing she is able to forward elevate to 100 degrees, with external rotation 30 degrees actively which is  equal to the contralateral side with regard to external rotation.  Forward flexion on the contralateral side is to 160 degrees.  IR is to the back pocket compared to L1 on the contralateral side.  Sensation is intact in all distributions.  Fires all heads of her deltoids.  2+ radial pulse  Imaging:    X-ray left shoulder 3 views: Status post reverse shoulder arthroplasty with of any evidence of complication  I personally reviewed and interpreted the radiographs.   Assessment:   61 year old female 6 weeks status post left reverse shoulder overall doing very well.  This time she will continue to progress per protocol and she will begin the strengthening phase protocol.  I will plan to see her back in 6 weeks for reassessment.  All questions regarding limitations were answered Plan :    -Return to clinic in 6 weeks      I personally saw and evaluated the patient, and participated in the management and treatment plan.  Vanetta Mulders, MD Attending Physician, Orthopedic Surgery  This document was dictated using Dragon voice recognition software. A reasonable attempt at proof reading has been made to minimize errors.

## 2022-03-27 NOTE — Therapy (Signed)
OUTPATIENT PHYSICAL THERAPY SHOULDER TREATMENT   Patient Name: [name] MRN: [id number] DOB:September 24, 1960, 61 y.o., female Today's Date: 03/27/2022   PT End of Session - 03/27/22 0810     Visit Number 6    Number of Visits 16    Date for PT Re-Evaluation 04/17/22    PT Start Time 0810    PT Stop Time 0849    PT Time Calculation (min) 39 min    Activity Tolerance Patient tolerated treatment well    Behavior During Therapy [facility] for tasks assessed/performed              Past Medical History:  Diagnosis Date   Arthritis    rheumatoid per pt   Past Surgical History:  Procedure Laterality Date   ABDOMINAL HYSTERECTOMY  2001   TAH/BSO fibroids   CATARACT EXTRACTION Right    COLONOSCOPY  04/27/2011   obstructed bile duct  2004   Dr Sharmaine Base   OOPHORECTOMY     BSO   REVERSE SHOULDER ARTHROPLASTY Left 02/14/2022   Procedure: LEFT REVERSE SHOULDER ARTHROPLASTY;  Surgeon: Vanetta Mulders, MD;  Location: [place];  Service: Orthopedics;  Laterality: Left;   ROTATOR CUFF REPAIR Bilateral 12/2010   Left and right   Patient Active Problem List   Diagnosis Date Noted   Rotator cuff arthropathy of left shoulder    Preop examination 12/30/2021   Arthritis of carpometacarpal [facility]) joint of left thumb 12/02/2021   Lumbar radiculopathy 05/29/2017   Greater trochanteric bursitis of left hip 05/29/2017   Contusion, hip and thigh, left, initial encounter 05/29/2017   Left hip pain 04/30/2017   Abdominal pain, LLQ 04/30/2017   Back pain 02/16/2015   Obesity (BMI 30-39.9) 06/09/2013   OSTEOARTHRITIS 12/15/2009   IMPAIRMENT, ONE EYE MODERATE, OTHER NOS 07/01/2007   SBO 41/74/0814   HERNIA, UMBILICAL 48/18/5631     PCP: Ann Held, DO   REFERRING PROVIDER: Vanetta Mulders, MD   REFERRING DIAG: M 12.812 - Rotator cuff arthropathy of left shoulder   THERAPY DIAG:  M52.612   M25.512   m62.81   Rationale for Evaluation and Treatment Rehabilitation   ONSET DATE:  02/14/2022   SUBJECTIVE:                                                                                                                                                                                       SUBJECTIVE STATEMENT: A little stiff but overall pretty good   PERTINENT HISTORY: OA, bil rotator cuff repair   PAIN:  Are you having pain? Yes: NPRS scale: 0/10   Pain location: Anterior shoulder, bicep Pain description: Sharp, occasional nerve  pain Aggravating factors: Movement, sleeping Relieving factors: Ice, medication   PRECAUTIONS: Shoulder   WEIGHT BEARING RESTRICTIONS No   FALLS:  Has patient fallen in last 6 months? No   LIVING ENVIRONMENT: Lives with: lives with their family and lives alone Lives in: House/apartment Stairs: No Has following equipment at home: None   OCCUPATION: [employer]   PLOF: Independent   PATIENT GOALS  Back to normal activity - walking, gym routine   OBJECTIVE:      PATIENT SURVEYS:  FOTO 29% initial 70% expected in 15 visits    COGNITION:           Overall cognitive status: Within functional limits for tasks assessed                                  SENSATION: WFL    UPPER EXTREMITY ROM:    Not tested at eval.   Passive ROM Right eval Left eval Left 6/26 Left 7/17  Shoulder flexion   55 85 with guarding  110  Shoulder extension         Shoulder abduction         Shoulder adduction         Shoulder internal rotation   Some pain getting to sling position      Shoulder external rotation   12 Not stretched but able to move to 5 degrees  20  Elbow flexion         Elbow extension         Wrist flexion         Wrist extension         Wrist ulnar deviation         Wrist radial deviation         Wrist pronation         Wrist supination         (Blank rows = not tested)                  TODAY'S TREATMENT:   7/24 pulleysAA with end range holds  flx & scaption MANUAL: STM Lt upper trap, passive shoulder  flexion Supine 90 to overhead with 2lb- PT assisted scap motion  3x15 Short lever arm AROM abd (to 50) & flexion (to 90)  7/17 Warm-up: Pulleys AAROM, 2 min Manual: PROM flexion, abd, ER per protocol limits w/ Grade I/II AP mobs Supine wand press AAROM, x15 with 1lb wand Supine wand flexion, ER AAROM, 2x10 each 1lb wand Wall isometrics - flex, abd, ext x15 each Supine serratus press - x15, PT-assisted AAROM  7/10 Warm-up: Pulleys AAROM, 2 min Manual: PROM flexion, abd, ER per protocol limits w/ Grade I/II AP mobs Supine serratus punch, AAROM 2x10 w/1lb wand Seated flexion rollout x15 Wall isometrics x15 each - flex, abd, ext          PATIENT EDUCATION: Education details: HEP, surgical protocol, therapy plan of care Person educated: Patient Education method: Explanation Education comprehension: verbalized understanding     HOME EXERCISE PROGRAM:   Access Code: [facility] URL: [email or web address]     ASSESSMENT:   CLINICAL IMPRESSION: Mechanics below 90 is excellent, will require further training to reduce overuse of upper trap.    OBJECTIVE IMPAIRMENTS decreased activity tolerance, decreased ROM, decreased strength, and impaired UE functional use.    ACTIVITY LIMITATIONS carrying, dressing, and reach over head   PARTICIPATION LIMITATIONS: community activity and occupation  PERSONAL FACTORS Age and Time since onset of injury/illness/exacerbation are also affecting patient's functional outcome.    REHAB POTENTIAL: Good   CLINICAL DECISION MAKING: Evolving/moderate complexity   EVALUATION COMPLEXITY: Moderate     GOALS: Goals reviewed with patient? Yes GOALS: Goals reviewed with patient? Yes   SHORT TERM GOALS: Target date: 03/13/2022   Pt will have 130 deg passive flexion ROM in supine. Baseline: Goal status: In progress - 100deg on 7/10   2.  Pt will have 30 deg passive ER ROM in supine. Baseline:  Goal status: Achieved 7/10   3.  Pt  will report pain below 3/10 NPS with PROM. Baseline:  Goal status: Achieved 7/10   LONG TERM GOALS: Target date: 04/03/2022     Pt will demonstrate 130 deg active flexion and scaption in standing. Baseline:  Goal status: INITIAL   2.  Pt will demonstrate 30 deg active ER in sidelying. Baseline:  Goal status: INITIAL   3.  Pt will be able to activate deltoid and rotator cuff musculature isometrically without pain. Baseline:  Goal status: INITIAL     Goals set to meet criteria at week 3 and 6 to indicate protocol phase advancement. Additional goals for full return to function to be made based on progression with surgical protocol.   PLAN: PT FREQUENCY: 2x/week   PT DURATION: 6 weeks   PLANNED INTERVENTIONS: Therapeutic exercises, Therapeutic activity, Neuromuscular re-education, Balance training, Gait training, Patient/Family education, Joint mobilization, Aquatic Therapy, Dry Needling, Cryotherapy, Moist heat, and scar mobilization   PLAN FOR NEXT SESSION:  continue A/P ROM  Kenzly Rogoff C. Taleigh Gero PT, DPT 03/27/22 8:56 PM

## 2022-03-31 DIAGNOSIS — H524 Presbyopia: Secondary | ICD-10-CM | POA: Diagnosis not present

## 2022-03-31 DIAGNOSIS — H40013 Open angle with borderline findings, low risk, bilateral: Secondary | ICD-10-CM | POA: Diagnosis not present

## 2022-04-03 ENCOUNTER — Ambulatory Visit (HOSPITAL_BASED_OUTPATIENT_CLINIC_OR_DEPARTMENT_OTHER): Payer: BC Managed Care – PPO | Admitting: Physical Therapy

## 2022-04-05 ENCOUNTER — Other Ambulatory Visit: Payer: Self-pay | Admitting: Family Medicine

## 2022-04-05 DIAGNOSIS — Z1231 Encounter for screening mammogram for malignant neoplasm of breast: Secondary | ICD-10-CM

## 2022-04-14 ENCOUNTER — Encounter (HOSPITAL_BASED_OUTPATIENT_CLINIC_OR_DEPARTMENT_OTHER): Payer: BC Managed Care – PPO | Admitting: Physical Therapy

## 2022-04-19 ENCOUNTER — Ambulatory Visit (HOSPITAL_BASED_OUTPATIENT_CLINIC_OR_DEPARTMENT_OTHER): Payer: BC Managed Care – PPO | Attending: Orthopaedic Surgery | Admitting: Physical Therapy

## 2022-04-19 DIAGNOSIS — M25512 Pain in left shoulder: Secondary | ICD-10-CM | POA: Diagnosis not present

## 2022-04-19 DIAGNOSIS — M25612 Stiffness of left shoulder, not elsewhere classified: Secondary | ICD-10-CM | POA: Insufficient documentation

## 2022-04-19 DIAGNOSIS — M6281 Muscle weakness (generalized): Secondary | ICD-10-CM | POA: Diagnosis not present

## 2022-04-19 NOTE — Therapy (Signed)
OUTPATIENT PHYSICAL THERAPY SHOULDER TREATMENT / PROGRESS NOTE   Patient Name: [name] MRN: [id number] DOB:12-17-1960, 61 y.o., female Today's Date: 04/20/2022   PT End of Session - 04/19/22 0932     Visit Number 7    Number of Visits 23    Date for PT Re-Evaluation 06/14/22    Authorization Type BCBS    PT Start Time 0850    PT Stop Time 0930    PT Time Calculation (min) 40 min    Activity Tolerance Patient tolerated treatment well    Behavior During Therapy [facility] for tasks assessed/performed               Past Medical History:  Diagnosis Date   Arthritis    rheumatoid per pt   Past Surgical History:  Procedure Laterality Date   ABDOMINAL HYSTERECTOMY  2001   TAH/BSO fibroids   CATARACT EXTRACTION Right    COLONOSCOPY  04/27/2011   obstructed bile duct  2004   Dr Sharmaine Base   OOPHORECTOMY     BSO   REVERSE SHOULDER ARTHROPLASTY Left 02/14/2022   Procedure: LEFT REVERSE SHOULDER ARTHROPLASTY;  Surgeon: Vanetta Mulders, MD;  Location: [place];  Service: Orthopedics;  Laterality: Left;   ROTATOR CUFF REPAIR Bilateral 12/2010   Left and right   Patient Active Problem List   Diagnosis Date Noted   Rotator cuff arthropathy of left shoulder    Preop examination 12/30/2021   Arthritis of carpometacarpal [facility]) joint of left thumb 12/02/2021   Lumbar radiculopathy 05/29/2017   Greater trochanteric bursitis of left hip 05/29/2017   Contusion, hip and thigh, left, initial encounter 05/29/2017   Left hip pain 04/30/2017   Abdominal pain, LLQ 04/30/2017   Back pain 02/16/2015   Obesity (BMI 30-39.9) 06/09/2013   OSTEOARTHRITIS 12/15/2009   IMPAIRMENT, ONE EYE MODERATE, OTHER NOS 07/01/2007   SBO 25/42/7062   HERNIA, UMBILICAL 37/62/8315     PCP: Ann Held, DO   REFERRING PROVIDER: Vanetta Mulders, MD   REFERRING DIAG: M 12.812 - Rotator cuff arthropathy of left shoulder   THERAPY DIAG:  M52.612   M25.512   m62.81   Rationale for Evaluation and  Treatment Rehabilitation   ONSET DATE: 02/14/2022   SUBJECTIVE:                                                                                                                                                                                       SUBJECTIVE STATEMENT: Pt is 9 weeks and 1 day s/p Left reverse shoulder arthroplasty.  Pt has been out of town recently and her sister also had a stroke which is why  she has not been to PT since 7/24.  Pt may return to work on 05/08/22.   Pt states she hasn't been having any pain just stiffness.  Pt does have some soreness, but not pain.  She reports compliance with HEP and started doing some shoulder flexion AAROM wall walks.  Pt states some days it feels like she can move it easy without much stiffness and then at other times, she feels very stiff.    FUNCTIONAL IMPROVEMENTS:  reaching including reaching upward, lifting light objects, dressing FUNCTIONAL LIMITATIONS:  reaching overhead, reaching backward, bathing, donning bra or tighter shirt, driving, applying lotion to legs.  Not performing occupational activities.   PERTINENT HISTORY: OA, bil rotator cuff repair   PAIN:  Are you having pain? No: NPRS scale: 0/10, Worst Pain:  0/10   Pain location: Anterior shoulder, bicep Pain description: Sharp, occasional nerve pain Aggravating factors: Movement, sleeping Relieving factors: Ice, medication   PRECAUTIONS: Shoulder   WEIGHT BEARING RESTRICTIONS No   FALLS:  Has patient fallen in last 6 months? No   LIVING ENVIRONMENT: Lives with: lives with their family and lives alone Lives in: House/apartment Stairs: No Has following equipment at home: None   OCCUPATION: [employer]   PLOF: Independent   PATIENT GOALS  Back to normal activity - walking, gym routine   OBJECTIVE:     TODAY'S TREATMENT:   PATIENT SURVEYS:  FOTO:  Improved from 62 initially to 32 currently.  70% expected in 15 visits       UPPER EXTREMITY ROM:     Passive  ROM Right eval Left eval Left 6/26 Left 7/17 Left 8/16  Shoulder flexion   55 85 with guarding  110 PROM/AAROM:  133/131  Shoulder extension          Shoulder abduction        PROM:  95  Shoulder adduction          Shoulder internal rotation   Some pain getting to sling position       Shoulder external rotation   12 Not stretched but able to move to 5 degrees  20 PROM/AAROM:  30/33  Elbow flexion          Elbow extension          Wrist flexion          Wrist extension          Wrist ulnar deviation          Wrist radial deviation          Wrist pronation          Wrist supination          (Blank rows = not tested)             -Reviewed current function, pain levels, and HEP compliance. -Assessed ROM. -Pt completed FOTO -Pt performed: Pulleys x 20 reps in flexion and x10 reps in scaption Supine wand flexion approx x12 reps Supine wand ER approx 12 reps Standing wall walks in flexion approx 6 reps  -Pt received L shoulder flexion, abd, and ER PROM per protocol ranges and pt tolerance.  Pt reports an occasional 5/10 pain with shoulder flexion PROM          PATIENT EDUCATION: Education details: HEP, objective findings, POC, current progress, and exercise form Person educated: Patient Education method: Explanation, verbal and tactile cues, demonstration Education comprehension: verbalized understanding, returned demonstration, verbal and tactile cues required     HOME  EXERCISE PROGRAM:   Access Code: [facility] URL: [email or web address]     ASSESSMENT:   CLINICAL IMPRESSION: Pt has tightness and pain with L shoulder PROM.  Pt had pain with L shoulder PROM and AAROM.  Pt is improving with L shoulder ROM t/o though has tightness and limitations.  Pt has been absent from PT since 7/24 due to her sister's medical concerns and being out of town.  Pt reports improved performance of self care activities including dressing though still has difficulty with  bathing.  She reports improved reaching though continues to have expected limitations with reaching.       OBJECTIVE IMPAIRMENTS decreased activity tolerance, decreased ROM, decreased strength, and impaired UE functional use.    ACTIVITY LIMITATIONS carrying, dressing, and reach over head   PARTICIPATION LIMITATIONS: community activity and occupation   PERSONAL FACTORS Age and Time since onset of injury/illness/exacerbation are also affecting patient's functional outcome.    REHAB POTENTIAL: Good   CLINICAL DECISION MAKING: Evolving/moderate complexity   EVALUATION COMPLEXITY: Moderate     GOALS: Goals reviewed with patient? Yes GOALS: Goals reviewed with patient? Yes   SHORT TERM GOALS: Target date: 03/13/2022   Pt will have 130 deg passive flexion ROM in supine. Baseline: Goal status: GOAL MET- 04/19/2022   2.  Pt will have 30 deg passive ER ROM in supine. Baseline:  Goal status: Achieved 7/10   3.  Pt will report pain below 3/10 NPS with PROM. Baseline:  Goal status: PARTIALLY MET   LONG TERM GOALS:      Pt will demonstrate 130 deg active flexion and scaption in standing. Baseline:  Goal status: ONGOING Target date: 05/24/2022    2.  Pt will demonstrate 30 deg active ER in sidelying. Baseline:  Goal status: ONGOING Target date: 04/03/2022   3.  Pt will be able to activate deltoid and rotator cuff musculature isometrically without pain. Baseline:  Goal status: ONGOING Target date: 04/03/2022  4.  Pt will be able to actively elevate L UE > 90 deg without significant shoulder hike for improved reaching Baseline:  Goal status: INITIAL Target date: 05/10/2022   5.  Pt will demo L shoulder AROM to be [facility] t/o for performance of ADLs and IADLs Baseline:  Goal status: INITIAL Target date: 06/14/2022  6.  Pt will be able to perform her ADLs and IADLs without significant difficulty and pain. Baseline:  Goal status: INITIAL Target date: 06/14/2022   PLAN: PT  FREQUENCY: 2x/week   PT DURATION: 6 - 8 weeks   PLANNED INTERVENTIONS: Therapeutic exercises, Therapeutic activity, Neuromuscular re-education, Balance training, Gait training, Patient/Family education, Joint mobilization, Aquatic Therapy, Dry Needling, Cryotherapy, Moist heat, and scar mobilization   PLAN FOR NEXT SESSION:  continue A/P ROM.  Cont per Dr. Nicki [place] reverse shoulder arthroplasty.  Selinda Michaels III PT, DPT 04/20/22 10:11 AM

## 2022-04-20 ENCOUNTER — Encounter (HOSPITAL_BASED_OUTPATIENT_CLINIC_OR_DEPARTMENT_OTHER): Payer: Self-pay | Admitting: Physical Therapy

## 2022-04-20 ENCOUNTER — Ambulatory Visit: Payer: BC Managed Care – PPO

## 2022-04-24 ENCOUNTER — Other Ambulatory Visit: Payer: Self-pay | Admitting: Family Medicine

## 2022-04-24 DIAGNOSIS — Z1231 Encounter for screening mammogram for malignant neoplasm of breast: Secondary | ICD-10-CM

## 2022-04-25 ENCOUNTER — Ambulatory Visit (HOSPITAL_BASED_OUTPATIENT_CLINIC_OR_DEPARTMENT_OTHER): Payer: BC Managed Care – PPO | Admitting: Physical Therapy

## 2022-04-25 ENCOUNTER — Encounter (HOSPITAL_BASED_OUTPATIENT_CLINIC_OR_DEPARTMENT_OTHER): Payer: Self-pay | Admitting: Physical Therapy

## 2022-04-25 DIAGNOSIS — M25612 Stiffness of left shoulder, not elsewhere classified: Secondary | ICD-10-CM

## 2022-04-25 DIAGNOSIS — M25512 Pain in left shoulder: Secondary | ICD-10-CM

## 2022-04-25 DIAGNOSIS — M6281 Muscle weakness (generalized): Secondary | ICD-10-CM

## 2022-04-25 NOTE — Therapy (Signed)
OUTPATIENT PHYSICAL THERAPY SHOULDER TREATMENT / PROGRESS NOTE   Patient Name: [name] MRN: [id number] DOB:08-28-1961, 61 y.o., female Today's Date: 04/25/2022   PT End of Session - 04/25/22 0924     Visit Number 8    Number of Visits 23    Date for PT Re-Evaluation 06/14/22    Authorization Type BCBS    PT Start Time 0930    PT Stop Time 0958    PT Time Calculation (min) 28 min    Activity Tolerance Patient tolerated treatment well    Behavior During Therapy [facility] for tasks assessed/performed               Past Medical History:  Diagnosis Date   Arthritis    rheumatoid per pt   Past Surgical History:  Procedure Laterality Date   ABDOMINAL HYSTERECTOMY  2001   TAH/BSO fibroids   CATARACT EXTRACTION Right    COLONOSCOPY  04/27/2011   obstructed bile duct  2004   Dr Sharmaine Base   OOPHORECTOMY     BSO   REVERSE SHOULDER ARTHROPLASTY Left 02/14/2022   Procedure: LEFT REVERSE SHOULDER ARTHROPLASTY;  Surgeon: Vanetta Mulders, MD;  Location: [place];  Service: Orthopedics;  Laterality: Left;   ROTATOR CUFF REPAIR Bilateral 12/2010   Left and right   Patient Active Problem List   Diagnosis Date Noted   Rotator cuff arthropathy of left shoulder    Preop examination 12/30/2021   Arthritis of carpometacarpal [facility]) joint of left thumb 12/02/2021   Lumbar radiculopathy 05/29/2017   Greater trochanteric bursitis of left hip 05/29/2017   Contusion, hip and thigh, left, initial encounter 05/29/2017   Left hip pain 04/30/2017   Abdominal pain, LLQ 04/30/2017   Back pain 02/16/2015   Obesity (BMI 30-39.9) 06/09/2013   OSTEOARTHRITIS 12/15/2009   IMPAIRMENT, ONE EYE MODERATE, OTHER NOS 07/01/2007   SBO 29/47/6546   HERNIA, UMBILICAL 50/35/4656     PCP: Ann Held, DO   REFERRING PROVIDER: Vanetta Mulders, MD   REFERRING DIAG: M 12.812 - Rotator cuff arthropathy of left shoulder   THERAPY DIAG:  M52.612   M25.512   m62.81   Rationale for Evaluation and  Treatment Rehabilitation   ONSET DATE: 02/14/2022   SUBJECTIVE:                                                                                                                                                                                       SUBJECTIVE STATEMENT: Pt is 10 weeks and 0 day s/p Left reverse shoulder arthroplasty.  Pt may return to work on 05/08/22.   The incision is a little bothersome.  FUNCTIONAL IMPROVEMENTS:  reaching including reaching upward, lifting light objects, dressing FUNCTIONAL LIMITATIONS:  reaching overhead, reaching backward, bathing, donning bra or tighter shirt, driving, applying lotion to legs.  Not performing occupational activities.   PERTINENT HISTORY: OA, bil rotator cuff repair   PAIN:  Are you having pain? No: NPRS scale: 0/10, Worst Pain:  0/10   Pain location: Anterior shoulder, bicep Pain description: Sharp, occasional nerve pain Aggravating factors: Movement, sleeping Relieving factors: Ice, medication   PRECAUTIONS: Shoulder   WEIGHT BEARING RESTRICTIONS No   FALLS:  Has patient fallen in last 6 months? No   LIVING ENVIRONMENT: Lives with: lives with their family and lives alone Lives in: House/apartment Stairs: No Has following equipment at home: None   OCCUPATION: [employer]   PLOF: Independent   PATIENT GOALS  Back to normal activity - walking, gym routine   OBJECTIVE:     TODAY'S TREATMENT:   Incision mobilizations- focus proximally to pull posterior and rolling of distal 1/2.  Eccentric lower from 90 flexion- cues for scap retraction Slow biceps curls 3lb, cues for scap stability 1lb biceps curl + horiz abd with elbow bent 1lb biceps curl + fwd punch   PATIENT SURVEYS:  FOTO:  Improved from 47 initially to 53 currently.  70% expected in 15 visits       UPPER EXTREMITY ROM:     Passive ROM Left eval Left 6/26 Left 7/17 Left 8/16  Shoulder flexion 55 85 with guarding  110 PROM/AAROM:  133/131   Shoulder extension        Shoulder abduction      PROM:  95  Shoulder adduction        Shoulder internal rotation Some pain getting to sling position       Shoulder external rotation 12 Not stretched but able to move to 5 degrees  20 PROM/AAROM:  30/33  Elbow flexion        Elbow extension        Wrist flexion        Wrist extension        Wrist ulnar deviation        Wrist radial deviation        Wrist pronation        Wrist supination        (Blank rows = not tested)                 PATIENT EDUCATION: Education details: HEP, objective findings, POC, current progress, and exercise form Person educated: Patient Education method: Explanation, verbal and tactile cues, demonstration Education comprehension: verbalized understanding, returned demonstration, verbal and tactile cues required     HOME EXERCISE PROGRAM:   Access Code: [facility] URL: [email or web address]  Mobilize incision, reach with 1lb weight during ADLs and think about scap retraction on return.      ASSESSMENT:   CLINICAL IMPRESSION: Limited incision mobility affecting use of biceps. Multiple reminders required for scapular stability for UE movement.      OBJECTIVE IMPAIRMENTS decreased activity tolerance, decreased ROM, decreased strength, and impaired UE functional use.    ACTIVITY LIMITATIONS carrying, dressing, and reach over head   PARTICIPATION LIMITATIONS: community activity and occupation   PERSONAL FACTORS Age and Time since onset of injury/illness/exacerbation are also affecting patient's functional outcome.    REHAB POTENTIAL: Good   CLINICAL DECISION MAKING: Evolving/moderate complexity   EVALUATION COMPLEXITY: Moderate     GOALS: Goals reviewed with patient? Yes GOALS: Goals reviewed with patient? Yes  SHORT TERM GOALS: Target date: 03/13/2022   Pt will have 130 deg passive flexion ROM in supine. Baseline: Goal status: GOAL MET- 04/19/2022   2.  Pt will have 30  deg passive ER ROM in supine. Baseline:  Goal status: Achieved 7/10   3.  Pt will report pain below 3/10 NPS with PROM. Baseline:  Goal status: PARTIALLY MET   LONG TERM GOALS:      Pt will demonstrate 130 deg active flexion and scaption in standing. Baseline:  Goal status: ONGOING Target date: 05/24/2022    2.  Pt will demonstrate 30 deg active ER in sidelying. Baseline:  Goal status: ONGOING Target date: 04/03/2022   3.  Pt will be able to activate deltoid and rotator cuff musculature isometrically without pain. Baseline:  Goal status: ONGOING Target date: 04/03/2022  4.  Pt will be able to actively elevate L UE > 90 deg without significant shoulder hike for improved reaching Baseline:  Goal status: INITIAL Target date: 05/10/2022   5.  Pt will demo L shoulder AROM to be [facility] t/o for performance of ADLs and IADLs Baseline:  Goal status: INITIAL Target date: 06/14/2022  6.  Pt will be able to perform her ADLs and IADLs without significant difficulty and pain. Baseline:  Goal status: INITIAL Target date: 06/14/2022   PLAN: PT FREQUENCY: 2x/week   PT DURATION: 6 - 8 weeks   PLANNED INTERVENTIONS: Therapeutic exercises, Therapeutic activity, Neuromuscular re-education, Balance training, Gait training, Patient/Family education, Joint mobilization, Aquatic Therapy, Dry Needling, Cryotherapy, Moist heat, and scar mobilization   PLAN FOR NEXT SESSION:  continue A/P ROM.  Cont per Dr. Nicki Guadalajara reverse shoulder arthroplasty.  Miaya Lafontant C. Shlomie Romig PT, DPT 04/25/22 9:59 AM

## 2022-04-27 ENCOUNTER — Ambulatory Visit
Admission: RE | Admit: 2022-04-27 | Discharge: 2022-04-27 | Disposition: A | Discharge status: Home / Self Care | Payer: BC Managed Care – PPO | Source: Ambulatory Visit | Attending: Family Medicine | Admitting: Family Medicine

## 2022-04-27 DIAGNOSIS — Z1231 Encounter for screening mammogram for malignant neoplasm of breast: Secondary | ICD-10-CM | POA: Diagnosis not present

## 2022-04-28 ENCOUNTER — Ambulatory Visit (INDEPENDENT_AMBULATORY_CARE_PROVIDER_SITE_OTHER): Payer: BC Managed Care – PPO | Admitting: Orthopaedic Surgery

## 2022-04-28 ENCOUNTER — Ambulatory Visit (INDEPENDENT_AMBULATORY_CARE_PROVIDER_SITE_OTHER): Payer: BC Managed Care – PPO

## 2022-04-28 DIAGNOSIS — Z9889 Other specified postprocedural states: Secondary | ICD-10-CM

## 2022-04-28 DIAGNOSIS — Z471 Aftercare following joint replacement surgery: Secondary | ICD-10-CM | POA: Diagnosis not present

## 2022-04-28 DIAGNOSIS — Z96612 Presence of left artificial shoulder joint: Secondary | ICD-10-CM | POA: Diagnosis not present

## 2022-04-28 NOTE — Progress Notes (Signed)
Post Operative Evaluation    Procedure/Date of Surgery: Left reverse shoulder arthroplasty 02/14/22  Interval History:   Presents today for follow-up of her left reverse.  Overall she is doing extremely well.  She continues to make improvement with physical therapy.  She is here today for further assessment.  She is concerned about returning to work as she does not feel like she has the strength needed.   PMH/PSH/Family History/Social History/Meds/Allergies:    Past Medical History:  Diagnosis Date   Arthritis    rheumatoid per pt   Past Surgical History:  Procedure Laterality Date   ABDOMINAL HYSTERECTOMY  2001   TAH/BSO fibroids   CATARACT EXTRACTION Right    COLONOSCOPY  04/27/2011   obstructed bile duct  2004   Dr Sharmaine Base   OOPHORECTOMY     BSO   REVERSE SHOULDER ARTHROPLASTY Left 02/14/2022   Procedure: LEFT REVERSE SHOULDER ARTHROPLASTY;  Surgeon: Vanetta Mulders, MD;  Location: [place];  Service: Orthopedics;  Laterality: Left;   ROTATOR CUFF REPAIR Bilateral 12/2010   Left and right   Social History   Socioeconomic History   Marital status: Single    Spouse name: Not on file   Number of children: 1   Years of education: Not on file   Highest education level: Not on file  Occupational History   Occupation: MEAT CUTTER    Employer: [employer]  Tobacco Use   Smoking status: Never   Smokeless tobacco: Never  Vaping Use   Vaping Use: Never used  Substance and Sexual Activity   Alcohol use: Yes    Alcohol/week: 0.0 standard drinks of alcohol    Comment: Occas. wine   Drug use: No   Sexual activity: Not Currently    Partners: Male    Birth control/protection: Surgical    Comment: 1st intercourse 61 yo-More than 5 partners,hysterectomy  Other Topics Concern   Not on file  Social History Narrative   Not on file   Social Determinants of Health   Financial Resource Strain: Not on file  Food Insecurity: Not on file   Transportation Needs: Not on file  Physical Activity: Not on file  Stress: Not on file  Social Connections: Not on file   Family History  Problem Relation Age of Onset   Heart disease Mother 44       MI   Stroke Mother    Hypertension Mother    Arthritis Mother    Pulmonary embolism Mother    Prostate cancer Father    Hypertension Father    Arthritis Sister    Colon cancer Neg Hx    Colon polyps Neg Hx    Esophageal cancer Neg Hx    Rectal cancer Neg Hx    Stomach cancer Neg Hx    Breast cancer Neg Hx    No Known Allergies Current Outpatient Medications  Medication Sig Dispense Refill   aspirin EC 325 MG tablet Take 1 tablet (325 mg total) by mouth daily. 30 tablet 0   Cholecalciferol (VITAMIN D PO) Take 1 tablet by mouth 2 (two) times a week.     cyclobenzaprine (FLEXERIL) 5 MG tablet Take 1 tablet (5 mg total) by mouth at bedtime. (Patient taking differently: Take 5 mg by mouth daily as needed for muscle spasms.) 30 tablet 0  LORazepam (ATIVAN) 1 MG tablet Take 1 tablet (1 mg total) by mouth every 8 (eight) hours. 2 tablet 0   methocarbamol (ROBAXIN) 500 MG tablet Take 1 tablet (500 mg total) by mouth 2 (two) times daily. 20 tablet 0   methylPREDNISolone (MEDROL) 4 MG tablet Take as directed 21 tablet 0   oxyCODONE (OXY IR/ROXICODONE) 5 MG immediate release tablet Take 1 tablet (5 mg total) by mouth every 4 (four) hours as needed (severe pain). 20 tablet 0   traMADol (ULTRAM) 50 MG tablet Take 1 tablet (50 mg total) by mouth every 6 (six) hours as needed. 30 tablet 0   No current facility-administered medications for this visit.   No results found.  Review of Systems:   A ROS was performed including pertinent positives and negatives as documented in the HPI.   Musculoskeletal Exam:    There were no vitals taken for this visit.  Left shoulder incision is healed.  No erythema or drainage.  She is able to flex and extend at the left elbow.  While standing she is able  to forward elevate to 120 degrees, with external rotation 50 degrees actively bilaterally.  Forward flexion on the contralateral side is to 160 degrees.  IR is to the back pocket compared to L1 on the contralateral side.  Sensation is intact in all distributions.  Fires all heads of her deltoids.  2+ radial pulse  Imaging:    X-ray left shoulder 3 views: Status post reverse shoulder arthroplasty with of any evidence of complication  I personally reviewed and interpreted the radiographs.   Assessment:   61 year old female 10 weeks status post left reverse shoulder arthroplasty overall continuing to improve.  She will continue rehab at this time.  I would like to keep her out of work for an additional 8 weeks to allow her to strengthen prior to doing any type of heavy lifting.  I will see her back at that time for recheck visit. Plan :    -Return to clinic in 6 weeks      I personally saw and evaluated the patient, and participated in the management and treatment plan.  Vanetta Mulders, MD Attending Physician, Orthopedic Surgery  This document was dictated using Dragon voice recognition software. A reasonable attempt at proof reading has been made to minimize errors.

## 2022-05-02 ENCOUNTER — Telehealth: Payer: Self-pay | Admitting: Orthopaedic Surgery

## 2022-05-02 NOTE — Telephone Encounter (Signed)
Pt called for an extension for 6 wk out of work note. Please call pt when ready for pick up. Pt phone number is [phone]

## 2022-05-02 NOTE — Telephone Encounter (Signed)
LMOM stating note was ready for pick up as well as Dr. Eddie Dibbles recommendation of 8 weeks OOW instead of 6

## 2022-05-03 ENCOUNTER — Encounter (HOSPITAL_BASED_OUTPATIENT_CLINIC_OR_DEPARTMENT_OTHER): Payer: Self-pay | Admitting: Physical Therapy

## 2022-05-03 ENCOUNTER — Ambulatory Visit (HOSPITAL_BASED_OUTPATIENT_CLINIC_OR_DEPARTMENT_OTHER): Payer: BC Managed Care – PPO | Admitting: Physical Therapy

## 2022-05-03 DIAGNOSIS — M25512 Pain in left shoulder: Secondary | ICD-10-CM | POA: Diagnosis not present

## 2022-05-03 DIAGNOSIS — M6281 Muscle weakness (generalized): Secondary | ICD-10-CM | POA: Diagnosis not present

## 2022-05-03 DIAGNOSIS — M25612 Stiffness of left shoulder, not elsewhere classified: Secondary | ICD-10-CM | POA: Diagnosis not present

## 2022-05-03 NOTE — Therapy (Signed)
OUTPATIENT PHYSICAL THERAPY SHOULDER TREATMENT / PROGRESS NOTE   Patient Name: [name] MRN: [id number] DOB:1960/09/25, 61 y.o., female Today's Date: 05/03/2022   PT End of Session - 05/03/22 0927     Visit Number 9    Number of Visits 23    Date for PT Re-Evaluation 06/14/22    Authorization Type BCBS    PT Start Time 0927    PT Stop Time 1008    PT Time Calculation (min) 41 min    Activity Tolerance Patient tolerated treatment well    Behavior During Therapy [facility] for tasks assessed/performed               Past Medical History:  Diagnosis Date   Arthritis    rheumatoid per pt   Past Surgical History:  Procedure Laterality Date   ABDOMINAL HYSTERECTOMY  2001   TAH/BSO fibroids   CATARACT EXTRACTION Right    COLONOSCOPY  04/27/2011   obstructed bile duct  2004   Dr Sharmaine Base   OOPHORECTOMY     BSO   REVERSE SHOULDER ARTHROPLASTY Left 02/14/2022   Procedure: LEFT REVERSE SHOULDER ARTHROPLASTY;  Surgeon: Vanetta Mulders, MD;  Location: [place];  Service: Orthopedics;  Laterality: Left;   ROTATOR CUFF REPAIR Bilateral 12/2010   Left and right   Patient Active Problem List   Diagnosis Date Noted   Rotator cuff arthropathy of left shoulder    Preop examination 12/30/2021   Arthritis of carpometacarpal [facility]) joint of left thumb 12/02/2021   Lumbar radiculopathy 05/29/2017   Greater trochanteric bursitis of left hip 05/29/2017   Contusion, hip and thigh, left, initial encounter 05/29/2017   Left hip pain 04/30/2017   Abdominal pain, LLQ 04/30/2017   Back pain 02/16/2015   Obesity (BMI 30-39.9) 06/09/2013   OSTEOARTHRITIS 12/15/2009   IMPAIRMENT, ONE EYE MODERATE, OTHER NOS 07/01/2007   SBO 38/25/0539   HERNIA, UMBILICAL 76/73/4193     PCP: Ann Held, DO   REFERRING PROVIDER: Vanetta Mulders, MD   REFERRING DIAG: M 12.812 - Rotator cuff arthropathy of left shoulder   THERAPY DIAG:  M52.612   M25.512   m62.81   Rationale for Evaluation and  Treatment Rehabilitation   ONSET DATE: 02/14/2022   SUBJECTIVE:                                                                                                                                                                                       SUBJECTIVE STATEMENT: Pt is 11 weeks and 1 day s/p Left reverse shoulder arthroplasty.  Pt may return to work on 05/08/22.   Doing ok, out of work for another 8  weeks.    PERTINENT HISTORY: OA, bil rotator cuff repair   PAIN:  Are you having pain? No: NPRS scale: 0/10, Worst Pain:  7/10   Pain location: rubbing superior left shoulder Pain description: sore, ache Aggravating factors: Movement, sleeping Relieving factors: Ice, medication   PRECAUTIONS: Shoulder   WEIGHT BEARING RESTRICTIONS No   FALLS:  Has patient fallen in last 6 months? No   LIVING ENVIRONMENT: Lives with: lives with their family and lives alone Lives in: House/apartment Stairs: No Has following equipment at home: None   OCCUPATION: [employer]   PLOF: Independent   PATIENT GOALS  Back to normal activity - walking, gym routine   OBJECTIVE:     TODAY'S TREATMENT:   MANUAL: upper trap STM, passive flexion with subscap STM; IASTM to incision site  Supine ABCs at 90 flexion Sidelying shoulder: ER, neutral ER to flexion punch at 90 flexion, flexion with arm // to floor. All performed with PT assisted scapular motion Sidelying resisted scapular protraction/retraction   PATIENT SURVEYS:  FOTO:  Improved from 47 initially to 8 currently.  70% expected in 15 visits       UPPER EXTREMITY ROM:     Passive ROM Left eval Left 6/26 Left 7/17 Left 8/16  Shoulder flexion 55 85 with guarding  110 PROM/AAROM:  133/131  Shoulder extension        Shoulder abduction      PROM:  95  Shoulder adduction        Shoulder internal rotation Some pain getting to sling position       Shoulder external rotation 12 Not stretched but able to move to 5 degrees  20 PROM/AAROM:   30/33  Elbow flexion        Elbow extension        Wrist flexion        Wrist extension        Wrist ulnar deviation        Wrist radial deviation        Wrist pronation        Wrist supination        (Blank rows = not tested)                 PATIENT EDUCATION: Education details: HEP, objective findings, POC, current progress, and exercise form Person educated: Patient Education method: Explanation, verbal and tactile cues, demonstration Education comprehension: verbalized understanding, returned demonstration, verbal and tactile cues required     HOME EXERCISE PROGRAM:   Access Code: [facility] URL: [email or web address]  Mobilize incision, reach with 1lb weight during ADLs and think about scap retraction on return.      ASSESSMENT:   CLINICAL IMPRESSION: Bra strap pressing into superior aspect of shoulder where she is describing pain. Notable hypersensitivity at incision site.    OBJECTIVE IMPAIRMENTS decreased activity tolerance, decreased ROM, decreased strength, and impaired UE functional use.    ACTIVITY LIMITATIONS carrying, dressing, and reach over head   PARTICIPATION LIMITATIONS: community activity and occupation   PERSONAL FACTORS Age and Time since onset of injury/illness/exacerbation are also affecting patient's functional outcome.    REHAB POTENTIAL: Good   CLINICAL DECISION MAKING: Evolving/moderate complexity   EVALUATION COMPLEXITY: Moderate     GOALS: Goals reviewed with patient? Yes GOALS: Goals reviewed with patient? Yes   SHORT TERM GOALS: Target date: 03/13/2022   Pt will have 130 deg passive flexion ROM in supine. Baseline: Goal status: GOAL MET- 04/19/2022   2.  Pt will  have 30 deg passive ER ROM in supine. Baseline:  Goal status: Achieved 7/10   3.  Pt will report pain below 3/10 NPS with PROM. Baseline:  Goal status: PARTIALLY MET   LONG TERM GOALS:      Pt will demonstrate 130 deg active flexion and scaption  in standing. Baseline:  Goal status: ONGOING Target date: 05/24/2022    2.  Pt will demonstrate 30 deg active ER in sidelying. Baseline: 35 deg Goal status: achieved on 8/30    3.  Pt will be able to activate deltoid and rotator cuff musculature isometrically without pain. Baseline:  Goal status: achieved   4.  Pt will be able to actively elevate L UE > 90 deg without significant shoulder hike for improved reaching Baseline:  Goal status: INITIAL Target date: 05/10/2022   5.  Pt will demo L shoulder AROM to be [facility] t/o for performance of ADLs and IADLs Baseline:  Goal status: INITIAL Target date: 06/14/2022  6.  Pt will be able to perform her ADLs and IADLs without significant difficulty and pain. Baseline:  Goal status: INITIAL Target date: 06/14/2022   PLAN: PT FREQUENCY: 2x/week   PT DURATION: 6 - 8 weeks   PLANNED INTERVENTIONS: Therapeutic exercises, Therapeutic activity, Neuromuscular re-education, Balance training, Gait training, Patient/Family education, Joint mobilization, Aquatic Therapy, Dry Needling, Cryotherapy, Moist heat, and scar mobilization   PLAN FOR NEXT SESSION:  continue A/P ROM.  Cont per Dr. Nicki Guadalajara reverse shoulder arthroplasty.  Sadee Osland C. Cecely Rengel PT, DPT 05/03/22 10:09 AM

## 2022-05-10 ENCOUNTER — Ambulatory Visit (HOSPITAL_BASED_OUTPATIENT_CLINIC_OR_DEPARTMENT_OTHER): Payer: BC Managed Care – PPO | Attending: Orthopaedic Surgery | Admitting: Physical Therapy

## 2022-05-10 ENCOUNTER — Encounter (HOSPITAL_BASED_OUTPATIENT_CLINIC_OR_DEPARTMENT_OTHER): Payer: Self-pay | Admitting: Physical Therapy

## 2022-05-10 DIAGNOSIS — M25612 Stiffness of left shoulder, not elsewhere classified: Secondary | ICD-10-CM | POA: Insufficient documentation

## 2022-05-10 DIAGNOSIS — M25512 Pain in left shoulder: Secondary | ICD-10-CM | POA: Insufficient documentation

## 2022-05-10 DIAGNOSIS — M6281 Muscle weakness (generalized): Secondary | ICD-10-CM | POA: Insufficient documentation

## 2022-05-10 NOTE — Therapy (Signed)
OUTPATIENT PHYSICAL THERAPY SHOULDER TREATMENT / PROGRESS NOTE   Patient Name: [name] MRN: [id number] DOB:10-02-1960, 61 y.o., female Today's Date: 05/10/2022   PT End of Session - 05/10/22 0806     Visit Number 10    Number of Visits 23    Date for PT Re-Evaluation 06/14/22    Authorization Type [employer]    PT Start Time 0800    PT Stop Time 0844    PT Time Calculation (min) 44 min    Activity Tolerance Patient tolerated treatment well    Behavior During Therapy [facility] for tasks assessed/performed            Progress Note Reporting Period 02/20/2022 to 05/10/2022  See note below for Objective Data and Assessment of Progress/Goals.       Past Medical History:  Diagnosis Date   Arthritis    rheumatoid per pt   Past Surgical History:  Procedure Laterality Date   ABDOMINAL HYSTERECTOMY  2001   TAH/BSO fibroids   CATARACT EXTRACTION Right    COLONOSCOPY  04/27/2011   obstructed bile duct  2004   Dr Sharmaine Base   OOPHORECTOMY     BSO   REVERSE SHOULDER ARTHROPLASTY Left 02/14/2022   Procedure: LEFT REVERSE SHOULDER ARTHROPLASTY;  Surgeon: Vanetta Mulders, MD;  Location: [place];  Service: Orthopedics;  Laterality: Left;   ROTATOR CUFF REPAIR Bilateral 12/2010   Left and right   Patient Active Problem List   Diagnosis Date Noted   Rotator cuff arthropathy of left shoulder    Preop examination 12/30/2021   Arthritis of carpometacarpal [facility]) joint of left thumb 12/02/2021   Lumbar radiculopathy 05/29/2017   Greater trochanteric bursitis of left hip 05/29/2017   Contusion, hip and thigh, left, initial encounter 05/29/2017   Left hip pain 04/30/2017   Abdominal pain, LLQ 04/30/2017   Back pain 02/16/2015   Obesity (BMI 30-39.9) 06/09/2013   OSTEOARTHRITIS 12/15/2009   IMPAIRMENT, ONE EYE MODERATE, OTHER NOS 07/01/2007   SBO 71/69/6789   HERNIA, UMBILICAL 38/06/1750     PCP: Ann Held, DO   REFERRING PROVIDER: Vanetta Mulders, MD   REFERRING DIAG: M  12.812 - Rotator cuff arthropathy of left shoulder   THERAPY DIAG:  M52.612   M25.512   m62.81   Rationale for Evaluation and Treatment Rehabilitation   ONSET DATE: 02/14/2022   SUBJECTIVE:                                                                                                                                                                                       SUBJECTIVE STATEMENT: Pt is 11 weeks and 1 day s/p Left  reverse shoulder arthroplasty.  Pt may return to work on 05/08/22.   Patient has no complaint. She is not having     PERTINENT HISTORY: OA, bil rotator cuff repair   PAIN:  Are you having pain? No: NPRS scale: 0/10, None this morning 9/6   Pain location: rubbing superior left shoulder Pain description: sore, ache Aggravating factors: Movement, sleeping Relieving factors: Ice, medication   PRECAUTIONS: Shoulder   WEIGHT BEARING RESTRICTIONS No   FALLS:  Has patient fallen in last 6 months? No   LIVING ENVIRONMENT: Lives with: lives with their family and lives alone Lives in: House/apartment Stairs: No Has following equipment at home: None   OCCUPATION: [employer]   PLOF: Independent   PATIENT GOALS  Back to normal activity - walking, gym routine   OBJECTIVE:     TODAY'S TREATMENT:  9/6 MANUAL: upper trap STM, passive flexion with subscap STM; IASTM to incision site  Reviewed FOTO score and strength/ ROM measurements   Reviewed de-sensitization at home using the softest material she has and progressing   Supine ABCs at 90 flexion 2x 1lb   Side lying er 1lb 3x10   Wand flexion 3x10   Finger ladder x5   Scap retraction x20 red   Shoulder extension x20 red   MANUAL: upper trap STM, passive flexion with subscap STM; IASTM to incision site  Supine ABCs at 90 flexion Sidelying shoulder: ER, neutral ER to flexion punch at 90 flexion, flexion with arm // to floor. All performed with PT assisted scapular motion Sidelying resisted scapular  protraction/retraction   PATIENT SURVEYS:  FOTO:  Improved from 47 initially to 44 currently( 10 visits .  70% expected in 15 visits       UPPER EXTREMITY ROM:     Passive ROM Left eval Left 6/26 Left 7/17 Left 8/16 Left  Shoulder flexion 55 85 with guarding  110 PROM/AAROM:  133/131 AROM 135   Shoulder extension         Shoulder abduction      PROM:  95   Shoulder adduction         Shoulder internal rotation Some pain getting to sling position      Can reach to gluteal   Shoulder external rotation 12 Not stretched but able to move to 5 degrees  20 PROM/AAROM:  30/33 Can reach behind her head   Elbow flexion         Elbow extension         Wrist flexion         Wrist extension         Wrist ulnar deviation         Wrist radial deviation         Wrist pronation         Wrist supination         (Blank rows = not tested)            MMT Right   Left   Shoulder flexion  4  Shoulder extension    Shoulder abduction    Shoulder adduction    Shoulder internal rotation  4  Shoulder external rotation  4  Elbow flexion    Elbow extension    Wrist flexion    Wrist extension    Wrist ulnar deviation    Wrist radial deviation    Wrist pronation    Wrist supination    (Blank rows = not tested)      PATIENT  EDUCATION: Education details: HEP, objective findings, POC, current progress, and exercise form Person educated: Patient Education method: Explanation, verbal and tactile cues, demonstration Education comprehension: verbalized understanding, returned demonstration, verbal and tactile cues required     HOME EXERCISE PROGRAM:   Access Code: [facility] URL: [email or web address]  Mobilize incision, reach with 1lb weight during ADLs and think about scap retraction on return.      ASSESSMENT:   CLINICAL IMPRESSION: The patient is making good progress. Her active and passive ROM are progressing as would be expected. She is still having some difficulty  with overhead reaching. She has a large trigger point in her upper trap which suggests compensation with flexion. She does well in supine with flexion. We will work on standing and semi-recumbent flexion over the next 6 weeks. No pain with treatment today. We also reviewed desensitization of the scar for home.    OBJECTIVE IMPAIRMENTS decreased activity tolerance, decreased ROM, decreased strength, and impaired UE functional use.    ACTIVITY LIMITATIONS carrying, dressing, and reach over head   PARTICIPATION LIMITATIONS: community activity and occupation   PERSONAL FACTORS Age and Time since onset of injury/illness/exacerbation are also affecting patient's functional outcome.    REHAB POTENTIAL: Good   CLINICAL DECISION MAKING: Evolving/moderate complexity   EVALUATION COMPLEXITY: Moderate     GOALS: Goals reviewed with patient? Yes GOALS: Goals reviewed with patient? Yes   SHORT TERM GOALS: Target date: 03/13/2022   Pt will have 130 deg passive flexion ROM in supine. Baseline: Goal status: GOAL MET- 04/19/2022   2.  Pt will have 30 deg passive ER ROM in supine. Baseline:  Goal status: Achieved 7/10   3.  Pt will report pain below 3/10 NPS with PROM. Baseline:  Goal status: PARTIALLY MET   LONG TERM GOALS:   9/6   Pt will demonstrate 130 deg active flexion and scaption in standing. Baseline:  Goal status: progressing towards goal. Still can not do scaption to 130  Target date: 05/24/2022    2.  Pt will demonstrate 30 deg active ER in sidelying. Baseline: 35 deg Goal status: achieved on 8/30    3.  Pt will be able to activate deltoid and rotator cuff musculature isometrically without pain. Baseline:  Goal status: achieved   4.  Pt will be able to actively elevate L UE > 90 deg without significant shoulder hike for improved reaching Baseline:  Goal status: INITIAL Target date: 05/10/2022  still hiking    5.  Pt will demo L shoulder AROM to be [facility] t/o for  performance of ADLs and IADLs Baseline:  Goal status: INITIAL Target date: 06/14/2022  6.  Pt will be able to perform her ADLs and IADLs without significant difficulty and pain. Baseline:  Goal status: INITIAL Target date: 06/14/2022 minor pain    PLAN: PT FREQUENCY: 2x/week   PT DURATION: 6 - 8 weeks   PLANNED INTERVENTIONS: Therapeutic exercises, Therapeutic activity, Neuromuscular re-education, Balance training, Gait training, Patient/Family education, Joint mobilization, Aquatic Therapy, Dry Needling, Cryotherapy, Moist heat, and scar mobilization   PLAN FOR NEXT SESSION:  continue A/P ROM.  Cont per Dr. Nicki Guadalajara reverse shoulder arthroplasty.   Carolyne Littles PT DPT  05/10/22 11:58 AM

## 2022-05-17 ENCOUNTER — Encounter (HOSPITAL_BASED_OUTPATIENT_CLINIC_OR_DEPARTMENT_OTHER): Payer: Self-pay | Admitting: Physical Therapy

## 2022-05-17 ENCOUNTER — Ambulatory Visit (HOSPITAL_BASED_OUTPATIENT_CLINIC_OR_DEPARTMENT_OTHER): Payer: BC Managed Care – PPO | Admitting: Physical Therapy

## 2022-05-17 DIAGNOSIS — M25612 Stiffness of left shoulder, not elsewhere classified: Secondary | ICD-10-CM | POA: Diagnosis not present

## 2022-05-17 DIAGNOSIS — M6281 Muscle weakness (generalized): Secondary | ICD-10-CM | POA: Diagnosis not present

## 2022-05-17 DIAGNOSIS — M25512 Pain in left shoulder: Secondary | ICD-10-CM

## 2022-05-17 NOTE — Therapy (Signed)
OUTPATIENT PHYSICAL THERAPY SHOULDER TREATMENT / PROGRESS NOTE   Patient Name: [name] MRN: [id number] DOB:Aug 04, 1961, 61 y.o., female Today's Date: 05/17/2022   PT End of Session - 05/17/22 0820     Visit Number 17    Number of Visits 23    Date for PT Re-Evaluation 06/21/22    Authorization Type [employer] progress note perfromed on visit 10    PT Start Time 0800    PT Stop Time 0840    PT Time Calculation (min) 40 min    Activity Tolerance Patient tolerated treatment well    Behavior During Therapy [facility] for tasks assessed/performed             Progress Note Reporting Period 02/20/2022 to 05/10/2022  See note below for Objective Data and Assessment of Progress/Goals.       Past Medical History:  Diagnosis Date   Arthritis    rheumatoid per pt   Past Surgical History:  Procedure Laterality Date   ABDOMINAL HYSTERECTOMY  2001   TAH/BSO fibroids   CATARACT EXTRACTION Right    COLONOSCOPY  04/27/2011   obstructed bile duct  2004   Dr Sharmaine Base   OOPHORECTOMY     BSO   REVERSE SHOULDER ARTHROPLASTY Left 02/14/2022   Procedure: LEFT REVERSE SHOULDER ARTHROPLASTY;  Surgeon: Vanetta Mulders, MD;  Location: [place];  Service: Orthopedics;  Laterality: Left;   ROTATOR CUFF REPAIR Bilateral 12/2010   Left and right   Patient Active Problem List   Diagnosis Date Noted   Rotator cuff arthropathy of left shoulder    Preop examination 12/30/2021   Arthritis of carpometacarpal [facility]) joint of left thumb 12/02/2021   Lumbar radiculopathy 05/29/2017   Greater trochanteric bursitis of left hip 05/29/2017   Contusion, hip and thigh, left, initial encounter 05/29/2017   Left hip pain 04/30/2017   Abdominal pain, LLQ 04/30/2017   Back pain 02/16/2015   Obesity (BMI 30-39.9) 06/09/2013   OSTEOARTHRITIS 12/15/2009   IMPAIRMENT, ONE EYE MODERATE, OTHER NOS 07/01/2007   SBO 62/37/6283   HERNIA, UMBILICAL 15/17/6160     PCP: Ann Held, DO   REFERRING PROVIDER:  Vanetta Mulders, MD   REFERRING DIAG: M 12.812 - Rotator cuff arthropathy of left shoulder   THERAPY DIAG:  M52.612   M25.512   m62.81   Rationale for Evaluation and Treatment Rehabilitation   ONSET DATE: 02/14/2022   SUBJECTIVE:                                                                                                                                                                                       SUBJECTIVE STATEMENT: Pt is  11 weeks and 1 day s/p Left reverse shoulder arthroplasty.  Pt may return to work on 05/08/22.   The patient continues to have some sensitivity in the front of her shoulder in the scar area. Otherwise she is doing well.   PERTINENT HISTORY: OA, bil rotator cuff repair   PAIN:  Are you having pain? No: NPRS scale: 0/10, None this morning 9/13    Pain location: rubbing superior left shoulder Pain description: sore, ache Aggravating factors: Movement, sleeping Relieving factors: Ice, medication   PRECAUTIONS: Shoulder   WEIGHT BEARING RESTRICTIONS No   FALLS:  Has patient fallen in last 6 months? No   LIVING ENVIRONMENT: Lives with: lives with their family and lives alone Lives in: House/apartment Stairs: No Has following equipment at home: None   OCCUPATION: [employer]   PLOF: Independent   PATIENT GOALS  Back to normal activity - walking, gym routine   OBJECTIVE:     TODAY'S TREATMENT:  9/13 MANUAL: upper trap STM, passive flexion with subscap STM; IASTM to incision site  Supine ABCs at 90 flexion 2x 1lb   Side lying er 1lb 3x10   Wand flexion 3x10 1lb x10 2lbs 2x10                Finger ladder x5   Scap retraction x20 red   Shoulder extension x20 red   MANUAL: upper trap STM, passive flexion with subscap STM; IASTM to incision site 9/6 MANUAL: upper trap STM, passive flexion with subscap STM; IASTM to incision site  Reviewed FOTO score and strength/ ROM measurements   Reviewed de-sensitization at home using the  softest material she has and progressing   Supine ABCs at 90 flexion 2x 1lb   Side lying er 1lb 3x10   Wand flexion 3x10   Finger ladder x5   Scap retraction x20 red   Shoulder extension x20 red   MANUAL: upper trap STM, passive flexion with subscap STM; IASTM to incision site  Supine ABCs at 90 flexion Sidelying shoulder: ER, neutral ER to flexion punch at 90 flexion, flexion with arm // to floor. All performed with PT assisted scapular motion Sidelying resisted scapular protraction/retraction   PATIENT SURVEYS:  FOTO:  Improved from 47 initially to 100 currently( 10 visits .  70% expected in 15 visits       UPPER EXTREMITY ROM:     Passive ROM Left eval Left 6/26 Left 7/17 Left 8/16 Left  Shoulder flexion 55 85 with guarding  110 PROM/AAROM:  133/131 AROM 135   Shoulder extension         Shoulder abduction      PROM:  95   Shoulder adduction         Shoulder internal rotation Some pain getting to sling position      Can reach to gluteal   Shoulder external rotation 12 Not stretched but able to move to 5 degrees  20 PROM/AAROM:  30/33 Can reach behind her head   Elbow flexion         Elbow extension         Wrist flexion         Wrist extension         Wrist ulnar deviation         Wrist radial deviation         Wrist pronation         Wrist supination         (Blank rows = not tested)  MMT Right   Left   Shoulder flexion  4  Shoulder extension    Shoulder abduction    Shoulder adduction    Shoulder internal rotation  4  Shoulder external rotation  4  Elbow flexion    Elbow extension    Wrist flexion    Wrist extension    Wrist ulnar deviation    Wrist radial deviation    Wrist pronation    Wrist supination    (Blank rows = not tested)      PATIENT EDUCATION: Education details: HEP, objective findings, POC, current progress, and exercise form Person educated: Patient Education method: Explanation, verbal and tactile cues,  demonstration Education comprehension: verbalized understanding, returned demonstration, verbal and tactile cues required     HOME EXERCISE PROGRAM:   Access Code: [facility] URL: [email or web address]  Mobilize incision, reach with 1lb weight during ADLs and think about scap retraction on return.      ASSESSMENT:   CLINICAL IMPRESSION: The patient continues to have sensitivity in the anterior shoulder. She was educated at home how to do self desensitization of the scar. We advanced her weight with supine forward flexion. She required some cuing for technique and had minor pain. We also added forward flexion in the mirror and scaption. She had mild pain but otherwise tolerated well. We will continue to progress functional strengthening with a progression towards return to work. We reviewed her HEP.   OBJECTIVE IMPAIRMENTS decreased activity tolerance, decreased ROM, decreased strength, and impaired UE functional use.    ACTIVITY LIMITATIONS carrying, dressing, and reach over head   PARTICIPATION LIMITATIONS: community activity and occupation   PERSONAL FACTORS Age and Time since onset of injury/illness/exacerbation are also affecting patient's functional outcome.    REHAB POTENTIAL: Good   CLINICAL DECISION MAKING: Evolving/moderate complexity   EVALUATION COMPLEXITY: Moderate     GOALS: Goals reviewed with patient? Yes GOALS: Goals reviewed with patient? Yes   SHORT TERM GOALS: Target date: 03/13/2022   Pt will have 130 deg passive flexion ROM in supine. Baseline: Goal status: GOAL MET- 04/19/2022   2.  Pt will have 30 deg passive ER ROM in supine. Baseline:  Goal status: Achieved 7/10   3.  Pt will report pain below 3/10 NPS with PROM. Baseline:  Goal status: PARTIALLY MET   LONG TERM GOALS:   9/6   Pt will demonstrate 130 deg active flexion and scaption in standing. Baseline:  Goal status: progressing towards goal. Still can not do scaption to 130   Target date: 05/24/2022    2.  Pt will demonstrate 30 deg active ER in sidelying. Baseline: 35 deg Goal status: achieved on 8/30    3.  Pt will be able to activate deltoid and rotator cuff musculature isometrically without pain. Baseline:  Goal status: achieved   4.  Pt will be able to actively elevate L UE > 90 deg without significant shoulder hike for improved reaching Baseline:  Goal status: INITIAL Target date: 05/10/2022  still hiking    5.  Pt will demo L shoulder AROM to be [facility] t/o for performance of ADLs and IADLs Baseline:  Goal status: INITIAL Target date: 06/14/2022  6.  Pt will be able to perform her ADLs and IADLs without significant difficulty and pain. Baseline:  Goal status: INITIAL Target date: 06/14/2022 minor pain    PLAN: PT FREQUENCY: 2x/week   PT DURATION: 6 - 8 weeks   PLANNED INTERVENTIONS: Therapeutic exercises, Therapeutic activity, Neuromuscular re-education, Balance training, Gait  training, Patient/Family education, Joint mobilization, Aquatic Therapy, Dry Needling, Cryotherapy, Moist heat, and scar mobilization   PLAN FOR NEXT SESSION:  continue A/P ROM.  Cont per Dr. Nicki Guadalajara reverse shoulder arthroplasty.   Carolyne Littles PT DPT  05/17/22 9:04 AM

## 2022-05-24 ENCOUNTER — Encounter (HOSPITAL_BASED_OUTPATIENT_CLINIC_OR_DEPARTMENT_OTHER): Payer: Self-pay | Admitting: Physical Therapy

## 2022-05-24 ENCOUNTER — Ambulatory Visit (HOSPITAL_BASED_OUTPATIENT_CLINIC_OR_DEPARTMENT_OTHER): Payer: BC Managed Care – PPO | Admitting: Physical Therapy

## 2022-05-24 DIAGNOSIS — M25612 Stiffness of left shoulder, not elsewhere classified: Secondary | ICD-10-CM | POA: Diagnosis not present

## 2022-05-24 DIAGNOSIS — M25512 Pain in left shoulder: Secondary | ICD-10-CM | POA: Diagnosis not present

## 2022-05-24 DIAGNOSIS — M6281 Muscle weakness (generalized): Secondary | ICD-10-CM

## 2022-05-24 NOTE — Therapy (Signed)
OUTPATIENT PHYSICAL THERAPY SHOULDER TREATMENT / Discharge    Patient Name: [name] MRN: [id number] DOB:Jul 01, 1961, 61 y.o., female Today's Date: 05/24/2022   PT End of Session - 05/24/22 0855     Visit Number 13    Number of Visits 23    Date for PT Re-Evaluation 06/21/22    Authorization Type [employer] progress note perfromed on visit 10    PT Start Time 0845    PT Stop Time 0928    PT Time Calculation (min) 43 min    Activity Tolerance Patient tolerated treatment well    Behavior During Therapy [facility] for tasks assessed/performed             Progress Note Reporting Period 02/20/2022 to 05/10/2022  See note below for Objective Data and Assessment of Progress/Goals.       Past Medical History:  Diagnosis Date   Arthritis    rheumatoid per pt   Past Surgical History:  Procedure Laterality Date   ABDOMINAL HYSTERECTOMY  2001   TAH/BSO fibroids   CATARACT EXTRACTION Right    COLONOSCOPY  04/27/2011   obstructed bile duct  2004   Dr Sharmaine Base   OOPHORECTOMY     BSO   REVERSE SHOULDER ARTHROPLASTY Left 02/14/2022   Procedure: LEFT REVERSE SHOULDER ARTHROPLASTY;  Surgeon: Vanetta Mulders, MD;  Location: [place];  Service: Orthopedics;  Laterality: Left;   ROTATOR CUFF REPAIR Bilateral 12/2010   Left and right   Patient Active Problem List   Diagnosis Date Noted   Rotator cuff arthropathy of left shoulder    Preop examination 12/30/2021   Arthritis of carpometacarpal [facility]) joint of left thumb 12/02/2021   Lumbar radiculopathy 05/29/2017   Greater trochanteric bursitis of left hip 05/29/2017   Contusion, hip and thigh, left, initial encounter 05/29/2017   Left hip pain 04/30/2017   Abdominal pain, LLQ 04/30/2017   Back pain 02/16/2015   Obesity (BMI 30-39.9) 06/09/2013   OSTEOARTHRITIS 12/15/2009   IMPAIRMENT, ONE EYE MODERATE, OTHER NOS 07/01/2007   SBO 40/98/1191   HERNIA, UMBILICAL 47/82/9562     PCP: Ann Held, DO   REFERRING PROVIDER:  Vanetta Mulders, MD   REFERRING DIAG: M 12.812 - Rotator cuff arthropathy of left shoulder   THERAPY DIAG:  M52.612   M25.512   m62.81   Rationale for Evaluation and Treatment Rehabilitation   ONSET DATE: 02/14/2022   SUBJECTIVE:                                                                                                                                                                                       SUBJECTIVE STATEMENT: The patient  has made excellent progress. She feels like she is ready to take her exercises and continue to work with them at home.   The patient continues to have some sensitivity in the front of her shoulder in the scar area. Otherwise she is doing well.   PERTINENT HISTORY: OA, bil rotator cuff repair   PAIN:  Are you having pain? No: NPRS scale: 0/10, None this morning 9/13    Pain location: rubbing superior left shoulder Pain description: sore, ache Aggravating factors: Movement, sleeping Relieving factors: Ice, medication   PRECAUTIONS: Shoulder   WEIGHT BEARING RESTRICTIONS No   FALLS:  Has patient fallen in last 6 months? No   LIVING ENVIRONMENT: Lives with: lives with their family and lives alone Lives in: House/apartment Stairs: No Has following equipment at home: None   OCCUPATION: [employer]   PLOF: Independent   PATIENT GOALS  Back to normal activity - walking, gym routine   OBJECTIVE:     TODAY'S TREATMENT:  09/20 Supine ABCs at 90 flexion 2x 1lb   Side lying er 1lb 3x10   Wand flexion 3x10 1lb x10 2lbs 2x10                Finger ladder x5   Scap retraction x20 red   Shoulder extension x20 red      9/13 MANUAL: upper trap STM, passive flexion with subscap STM; IASTM to incision site  Supine ABCs at 90 flexion 2x 1lb   Side lying er 1lb 3x10   Wand flexion 3x10 1lb x10 2lbs 2x10                Finger ladder x5   Scap retraction x20 red   Shoulder extension x20 red   MANUAL: upper trap STM, passive  flexion with subscap STM; IASTM to incision site 9/6 MANUAL: upper trap STM, passive flexion with subscap STM; IASTM to incision site  Reviewed FOTO score and strength/ ROM measurements   Reviewed de-sensitization at home using the softest material she has and progressing   Supine ABCs at 90 flexion 2x 1lb   Side lying er 1lb 3x10   Wand flexion 3x10   Finger ladder x5   Scap retraction x20 red   Shoulder extension x20 red   MANUAL: upper trap STM, passive flexion with subscap STM; IASTM to incision site  Supine ABCs at 90 flexion Sidelying shoulder: ER, neutral ER to flexion punch at 90 flexion, flexion with arm // to floor. All performed with PT assisted scapular motion Sidelying resisted scapular protraction/retraction   PATIENT SURVEYS:  FOTO:  Improved from 47 initially to 41 currently( 10 visits .  70% expected in 15 visits       UPPER EXTREMITY ROM:     Passive ROM Left eval Left 6/26 Left 7/17 Left 8/16 Left  Shoulder flexion 55 85 with guarding  110 PROM/AAROM:  133/131 AROM 135   Shoulder extension         Shoulder abduction      PROM:  95   Shoulder adduction         Shoulder internal rotation Some pain getting to sling position      Can reach to gluteal   Shoulder external rotation 12 Not stretched but able to move to 5 degrees  20 PROM/AAROM:  30/33 Can reach behind her head   Elbow flexion         Elbow extension         Wrist flexion  Wrist extension         Wrist ulnar deviation         Wrist radial deviation         Wrist pronation         Wrist supination         (Blank rows = not tested)            MMT Right   Left   Shoulder flexion  4  Shoulder extension    Shoulder abduction    Shoulder adduction    Shoulder internal rotation  4  Shoulder external rotation  4  Elbow flexion    Elbow extension    Wrist flexion    Wrist extension    Wrist ulnar deviation    Wrist radial deviation    Wrist pronation    Wrist  supination    (Blank rows = not tested)      PATIENT EDUCATION: Education details: HEP, objective findings, POC, current progress, and exercise form Person educated: Patient Education method: Explanation, verbal and tactile cues, demonstration Education comprehension: verbalized understanding, returned demonstration, verbal and tactile cues required     HOME EXERCISE PROGRAM:   Access Code: [facility] URL: [email or web address]  Mobilize incision, reach with 1lb weight during ADLs and think about scap retraction on return.      ASSESSMENT:   CLINICAL IMPRESSION: The patient has progressed well. She feels comfortable with her program. She will be retiring instead of going back to work. She would like to tart at the gym. We reviewed exercises both for home and at the gym. She was advised to avoid over head lfiting but we reviewed cables and the row machine. She tolerated very well. We also reviewed adding weight to standing flexion. Her main deficits remain in sensitivity of the scar and IR. We reviewed IR stretching for home. We will level her case open for now in case she needs a follow up. We will otherwise D/C to HEP. See below for goal specific progress. OBJECTIVE IMPAIRMENTS decreased activity tolerance, decreased ROM, decreased strength, and impaired UE functional use.    ACTIVITY LIMITATIONS carrying, dressing, and reach over head   PARTICIPATION LIMITATIONS: community activity and occupation   PERSONAL FACTORS Age and Time since onset of injury/illness/exacerbation are also affecting patient's functional outcome.    REHAB POTENTIAL: Good   CLINICAL DECISION MAKING: Evolving/moderate complexity   EVALUATION COMPLEXITY: Moderate     GOALS: Goals reviewed with patient? Yes GOALS: Goals reviewed with patient? Yes   SHORT TERM GOALS: Target date: 03/13/2022   Pt will have 130 deg passive flexion ROM in supine. Baseline: Goal status: GOAL MET- 04/19/2022    2.  Pt will have 30 deg passive ER ROM in supine. Baseline:  Goal status: Achieved 7/10   3.  Pt will report pain below 3/10 NPS with PROM. Baseline:  Goal status: PARTIALLY MET   LONG TERM GOALS:   9/6   Pt will demonstrate 130 deg active flexion and scaption in standing. Baseline:  Goal status: progressing towards goal. Still can not do scaption to 130  Target date: 05/24/2022    2.  Pt will demonstrate 30 deg active ER in sidelying. Baseline: 35 deg Goal status: achieved on 8/30    3.  Pt will be able to activate deltoid and rotator cuff musculature isometrically without pain. Baseline:  Goal status: achieved   4.  Pt will be able to actively elevate L UE > 90 deg without significant shoulder  hike for improved reaching Baseline:  Goal status: able to do without difficulty  Target date: 05/10/2022  still hiking    5.  Pt will demo L shoulder AROM to be [facility] t/o for performance of ADLs and IADLs Baseline:  Goal status:performing without difficulty  Target date: 06/14/2022  6.  Pt will be able to perform her ADLs and IADLs without significant difficulty and pain. Baseline:  Goal status: performing without major difficulty Target date: 06/14/2022 minor pain    PLAN: PT FREQUENCY: 2x/week   PT DURATION: 6 - 8 weeks   PLANNED INTERVENTIONS: Therapeutic exercises, Therapeutic activity, Neuromuscular re-education, Balance training, Gait training, Patient/Family education, Joint mobilization, Aquatic Therapy, Dry Needling, Cryotherapy, Moist heat, and scar mobilization   PLAN FOR NEXT SESSION:  continue A/P ROM.  Cont per Dr. Nicki Guadalajara reverse shoulder arthroplasty.   Carolyne Littles PT DPT  05/24/22 8:57 AM

## 2022-05-29 ENCOUNTER — Telehealth: Payer: Self-pay | Admitting: Orthopaedic Surgery

## 2022-05-29 ENCOUNTER — Encounter (HOSPITAL_BASED_OUTPATIENT_CLINIC_OR_DEPARTMENT_OTHER): Payer: Self-pay | Admitting: Orthopaedic Surgery

## 2022-05-29 NOTE — Telephone Encounter (Signed)
Returned call to patient, work note ready for pick up as requested at [employer] front desk

## 2022-05-29 NOTE — Telephone Encounter (Signed)
Pt called requesting a call from [place]. She states she has medical question about her diagnosis and why she needed to be out of work for 8 wks per her employer questions. Please call pt at [phone].

## 2022-05-31 ENCOUNTER — Encounter (HOSPITAL_BASED_OUTPATIENT_CLINIC_OR_DEPARTMENT_OTHER): Payer: BC Managed Care – PPO | Admitting: Physical Therapy

## 2022-06-09 ENCOUNTER — Ambulatory Visit (INDEPENDENT_AMBULATORY_CARE_PROVIDER_SITE_OTHER): Payer: BC Managed Care – PPO | Admitting: Orthopaedic Surgery

## 2022-06-09 DIAGNOSIS — Z9889 Other specified postprocedural states: Secondary | ICD-10-CM | POA: Diagnosis not present

## 2022-06-09 NOTE — Progress Notes (Signed)
Post Operative Evaluation    Procedure/Date of Surgery: Left reverse shoulder arthroplasty 02/14/22  Interval History:   Presents today for follow-up of her left reverse.  Overall she is doing very well.  Range of motion continues to improve.  She does still endorse some weakness which she is hesitant to return to work as this would require heavy lifting overhead.   PMH/PSH/Family History/Social History/Meds/Allergies:    Past Medical History:  Diagnosis Date   Arthritis    rheumatoid per pt   Past Surgical History:  Procedure Laterality Date   ABDOMINAL HYSTERECTOMY  2001   TAH/BSO fibroids   CATARACT EXTRACTION Right    COLONOSCOPY  04/27/2011   obstructed bile duct  2004   Dr Sharmaine Base   OOPHORECTOMY     BSO   REVERSE SHOULDER ARTHROPLASTY Left 02/14/2022   Procedure: LEFT REVERSE SHOULDER ARTHROPLASTY;  Surgeon: Vanetta Mulders, MD;  Location: [place];  Service: Orthopedics;  Laterality: Left;   ROTATOR CUFF REPAIR Bilateral 12/2010   Left and right   Social History   Socioeconomic History   Marital status: Single    Spouse name: Not on file   Number of children: 1   Years of education: Not on file   Highest education level: Not on file  Occupational History   Occupation: MEAT CUTTER    Employer: [employer]  Tobacco Use   Smoking status: Never   Smokeless tobacco: Never  Vaping Use   Vaping Use: Never used  Substance and Sexual Activity   Alcohol use: Yes    Alcohol/week: 0.0 standard drinks of alcohol    Comment: Occas. wine   Drug use: No   Sexual activity: Not Currently    Partners: Male    Birth control/protection: Surgical    Comment: 1st intercourse 62 yo-More than 5 partners,hysterectomy  Other Topics Concern   Not on file  Social History Narrative   Not on file   Social Determinants of Health   Financial Resource Strain: Not on file  Food Insecurity: Not on file  Transportation Needs: Not on file   Physical Activity: Not on file  Stress: Not on file  Social Connections: Not on file   Family History  Problem Relation Age of Onset   Heart disease Mother 11       MI   Stroke Mother    Hypertension Mother    Arthritis Mother    Pulmonary embolism Mother    Prostate cancer Father    Hypertension Father    Arthritis Sister    Colon cancer Neg Hx    Colon polyps Neg Hx    Esophageal cancer Neg Hx    Rectal cancer Neg Hx    Stomach cancer Neg Hx    Breast cancer Neg Hx    No Known Allergies Current Outpatient Medications  Medication Sig Dispense Refill   aspirin EC 325 MG tablet Take 1 tablet (325 mg total) by mouth daily. 30 tablet 0   Cholecalciferol (VITAMIN D PO) Take 1 tablet by mouth 2 (two) times a week.     cyclobenzaprine (FLEXERIL) 5 MG tablet Take 1 tablet (5 mg total) by mouth at bedtime. (Patient taking differently: Take 5 mg by mouth daily as needed for muscle spasms.) 30 tablet 0   LORazepam (ATIVAN) 1 MG tablet  Take 1 tablet (1 mg total) by mouth every 8 (eight) hours. 2 tablet 0   methocarbamol (ROBAXIN) 500 MG tablet Take 1 tablet (500 mg total) by mouth 2 (two) times daily. 20 tablet 0   methylPREDNISolone (MEDROL) 4 MG tablet Take as directed 21 tablet 0   oxyCODONE (OXY IR/ROXICODONE) 5 MG immediate release tablet Take 1 tablet (5 mg total) by mouth every 4 (four) hours as needed (severe pain). 20 tablet 0   traMADol (ULTRAM) 50 MG tablet Take 1 tablet (50 mg total) by mouth every 6 (six) hours as needed. 30 tablet 0   No current facility-administered medications for this visit.   No results found.  Review of Systems:   A ROS was performed including pertinent positives and negatives as documented in the HPI.   Musculoskeletal Exam:    There were no vitals taken for this visit.  Left shoulder incision is healed.  No erythema or drainage.  She is able to flex and extend at the left elbow.  While standing she is able to forward elevate to 160 degrees  equal to the contralateral side, with external rotation 50 degrees actively bilaterally.   IR is to midline compared to L1 on the contralateral side.  Sensation is intact in all distributions.  Fires all heads of her deltoids.  2+ radial pulse  Imaging:    X-ray left shoulder 3 views: Status post reverse shoulder arthroplasty with of any evidence of complication  I personally reviewed and interpreted the radiographs.   Assessment:   61 year old female 4 months status post left reverse shoulder arthroplasty overall doing very well.  I will plan to see her back in 2 months for recheck.  At this time she remain out of work for an additional 2 months given her strength limitations. Plan :    -Return to clinic in 2 months      I personally saw and evaluated the patient, and participated in the management and treatment plan.  Vanetta Mulders, MD Attending Physician, Orthopedic Surgery  This document was dictated using Dragon voice recognition software. A reasonable attempt at proof reading has been made to minimize errors.

## 2022-07-14 ENCOUNTER — Telehealth: Payer: Self-pay | Admitting: Orthopaedic Surgery

## 2022-07-14 NOTE — Telephone Encounter (Signed)
Attempted CB but was unable to leave VM as it was not secure line

## 2022-07-14 NOTE — Telephone Encounter (Signed)
Received call from Amber with [employer] needing clarification on the restrictions for the patient. The number to contact Amber is [phone]

## 2022-08-01 ENCOUNTER — Telehealth: Payer: Self-pay | Admitting: Orthopaedic Surgery

## 2022-08-01 NOTE — Telephone Encounter (Signed)
Pt called requesting a call back from [place]. She states her sort term disability sent a from to [place] for updates and told pt they have not received form back. I also transferred pt to [employer] just incase they have pt's form. Please call pt at [phone]

## 2022-08-01 NOTE — Telephone Encounter (Signed)
RC to patient and informed her I did not receive STD paperwork for her. No further questions were asked.

## 2022-08-02 ENCOUNTER — Encounter (HOSPITAL_BASED_OUTPATIENT_CLINIC_OR_DEPARTMENT_OTHER): Payer: Self-pay | Admitting: Orthopaedic Surgery

## 2022-08-18 ENCOUNTER — Ambulatory Visit (HOSPITAL_BASED_OUTPATIENT_CLINIC_OR_DEPARTMENT_OTHER): Payer: BC Managed Care – PPO | Admitting: Orthopaedic Surgery

## 2022-09-08 ENCOUNTER — Ambulatory Visit (INDEPENDENT_AMBULATORY_CARE_PROVIDER_SITE_OTHER): Payer: 59 | Admitting: Orthopaedic Surgery

## 2022-09-08 ENCOUNTER — Ambulatory Visit (INDEPENDENT_AMBULATORY_CARE_PROVIDER_SITE_OTHER): Payer: 59

## 2022-09-08 DIAGNOSIS — M25552 Pain in left hip: Secondary | ICD-10-CM

## 2022-09-08 DIAGNOSIS — M67959 Unspecified disorder of synovium and tendon, unspecified thigh: Secondary | ICD-10-CM | POA: Diagnosis not present

## 2022-09-08 DIAGNOSIS — Z9889 Other specified postprocedural states: Secondary | ICD-10-CM

## 2022-09-08 DIAGNOSIS — Z96642 Presence of left artificial hip joint: Secondary | ICD-10-CM | POA: Diagnosis not present

## 2022-09-08 MED ORDER — TRIAMCINOLONE ACETONIDE 40 MG/ML IJ SUSP
80.0000 mg | INTRAMUSCULAR | Status: AC | PRN
  Administered 2022-09-08: 80 mg via INTRA_ARTICULAR

## 2022-09-08 MED ORDER — LIDOCAINE HCL 1 % IJ SOLN
4.0000 mL | INTRAMUSCULAR | Status: AC | PRN
  Administered 2022-09-08: 4 mL

## 2022-09-08 NOTE — Progress Notes (Signed)
Chief Complaint: Left hip pain radiating leg pain     History of Present Illness:    [name] is a 62 y.o. female presents today for her left hip and leg pain.  She states that for the last 3 years this is been radiating around from the back into the side of the hip and down the knee.  She did previously have epidural injections the second of which did not provide any relief.  She was seeing [name] prior who performed an MRI of her lower back.  She is quite limited in most activities of daily living due to this.  She is here today for further assessment    Surgical History:   none  PMH/PSH/Family History/Social History/Meds/Allergies:    Past Medical History:  Diagnosis Date  . Arthritis    rheumatoid per pt   Past Surgical History:  Procedure Laterality Date  . ABDOMINAL HYSTERECTOMY  2001   TAH/BSO fibroids  . CATARACT EXTRACTION Right   . COLONOSCOPY  04/27/2011  . obstructed bile duct  2004   Dr Sharmaine Base  . OOPHORECTOMY     BSO  . REVERSE SHOULDER ARTHROPLASTY Left 02/14/2022   Procedure: LEFT REVERSE SHOULDER ARTHROPLASTY;  Surgeon: Vanetta Mulders, MD;  Location: [place];  Service: Orthopedics;  Laterality: Left;  . ROTATOR CUFF REPAIR Bilateral 12/2010   Left and right   Social History   Socioeconomic History  . Marital status: Single    Spouse name: Not on file  . Number of children: 1  . Years of education: Not on file  . Highest education level: Not on file  Occupational History  . Occupation: MEAT CUTTER    Employer: [employer]  Tobacco Use  . Smoking status: Never  . Smokeless tobacco: Never  Vaping Use  . Vaping Use: Never used  Substance and Sexual Activity  . Alcohol use: Yes    Alcohol/week: 0.0 standard drinks of alcohol    Comment: Occas. wine  . Drug use: No  . Sexual activity: Not Currently    Partners: Male    Birth control/protection: Surgical    Comment: 1st intercourse 62 yo-More than 5  partners,hysterectomy  Other Topics Concern  . Not on file  Social History Narrative  . Not on file   Social Determinants of Health   Financial Resource Strain: Not on file  Food Insecurity: Not on file  Transportation Needs: Not on file  Physical Activity: Not on file  Stress: Not on file  Social Connections: Not on file   Family History  Problem Relation Age of Onset  . Heart disease Mother 48       MI  . Stroke Mother   . Hypertension Mother   . Arthritis Mother   . Pulmonary embolism Mother   . Prostate cancer Father   . Hypertension Father   . Arthritis Sister   . Colon cancer Neg Hx   . Colon polyps Neg Hx   . Esophageal cancer Neg Hx   . Rectal cancer Neg Hx   . Stomach cancer Neg Hx   . Breast cancer Neg Hx    No Known Allergies Current Outpatient Medications  Medication Sig Dispense Refill  . aspirin EC 325 MG tablet Take 1 tablet (325 mg total) by mouth daily. 30 tablet  0  . Cholecalciferol (VITAMIN D PO) Take 1 tablet by mouth 2 (two) times a week.    . cyclobenzaprine (FLEXERIL) 5 MG tablet Take 1 tablet (5 mg total) by mouth at bedtime. (Patient taking differently: Take 5 mg by mouth daily as needed for muscle spasms.) 30 tablet 0  . LORazepam (ATIVAN) 1 MG tablet Take 1 tablet (1 mg total) by mouth every 8 (eight) hours. 2 tablet 0  . methocarbamol (ROBAXIN) 500 MG tablet Take 1 tablet (500 mg total) by mouth 2 (two) times daily. 20 tablet 0  . methylPREDNISolone (MEDROL) 4 MG tablet Take as directed 21 tablet 0  . oxyCODONE (OXY IR/ROXICODONE) 5 MG immediate release tablet Take 1 tablet (5 mg total) by mouth every 4 (four) hours as needed (severe pain). 20 tablet 0  . traMADol (ULTRAM) 50 MG tablet Take 1 tablet (50 mg total) by mouth every 6 (six) hours as needed. 30 tablet 0   No current facility-administered medications for this visit.   No results found.  Review of Systems:   A ROS was performed including pertinent positives and negatives as  documented in the HPI.  Physical Exam :   Constitutional: NAD and appears stated age Neurological: Alert and oriented Psych: Appropriate affect and cooperative There were no vitals taken for this visit.   Comprehensive Musculoskeletal Exam:    Inspection Right Left  Skin No atrophy or gross abnormalities appreciated No atrophy or gross abnormalities appreciated  Palpation    Tenderness None None  Crepitus None None  Range of Motion    Flexion (passive) 120 120  Extension 30 30  IR 30 30 no pain  ER 40 45 with pain lateral trochanter  Strength    Flexion  5/5 5/5  Extension 5/5 5/5  Special Tests    FABER Negative Negative  FADIR Negative Negative  ER Lag/Capsular Insufficiency Negative Negative  Instability Negative Negative  Sacroiliac pain Negative  Negative   Instability    Generalized Laxity No No  Neurologic    sciatic, femoral, obturator nerves intact to light sensation  Vascular/Lymphatic    DP pulse 2+ 2+  Lumbar Exam    Patient has symmetric lumbar range of motion with negative pain referral to hip     Imaging:   Xray (2 views left hip): Mild degenerative findings about the femoral acetabular joint  MRI (lumbar spine): There are some degenerative changes without any significant evidence of neural compression  I personally reviewed and interpreted the radiographs.   Assessment:   62 y.o. female with left hip pain radiating around the side.  I did describe that this may be consistent more with gluteal pathology given the fact that her MRI of the lumbar spine is very normal.  Side effect I would like to plan for an ultrasound-guided injection over the gluteus tendon.  I will plan to see her back in 2 weeks to assess the efficacy  Plan :    -Left hip ultrasound-guided injection performed after verbal consent obtained    Procedure Note  Patient: [name]             Date of Birth: December 18, 1960           MRN: [id number]             Visit Date:  09/08/2022  Procedures: Visit Diagnoses:  1. 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 Consent was given by the patient. Immediately prior to procedure a time out was called to verify the correct patient, procedure, equipment, support staff and site/side marked as required. Patient was prepped and draped in the usual sterile fashion.          I personally saw and evaluated the patient, and participated in the management and treatment plan.  Vanetta Mulders, MD Attending Physician, Orthopedic Surgery  This document was dictated using Dragon voice recognition software. A reasonable attempt at proof reading has been made to minimize errors.

## 2022-09-22 ENCOUNTER — Ambulatory Visit (HOSPITAL_BASED_OUTPATIENT_CLINIC_OR_DEPARTMENT_OTHER): Payer: 59 | Admitting: Orthopaedic Surgery

## 2022-09-22 DIAGNOSIS — M67959 Unspecified disorder of synovium and tendon, unspecified thigh: Secondary | ICD-10-CM | POA: Diagnosis not present

## 2022-09-22 NOTE — Progress Notes (Signed)
Chief Complaint: Left hip pain radiating leg pain     History of Present Illness:   09/22/2022: Presents today for follow-up of her left hip.  Overall she did get 2 weeks of extremely good relief with her injection although this is subsequently worn off.  She continues to have pain and weakness on the lateral aspect of the hip.  [name] is a 62 y.o. female presents today for her left hip and leg pain.  She states that for the last 3 years this is been radiating around from the back into the side of the hip and down the knee.  She did previously have epidural injections the second of which did not provide any relief.  She was seeing [name] prior who performed an MRI of her lower back.  She is quite limited in most activities of daily living due to this.  She is here today for further assessment    Surgical History:   none  PMH/PSH/Family History/Social History/Meds/Allergies:    Past Medical History:  Diagnosis Date   Arthritis    rheumatoid per pt   Past Surgical History:  Procedure Laterality Date   ABDOMINAL HYSTERECTOMY  2001   TAH/BSO fibroids   CATARACT EXTRACTION Right    COLONOSCOPY  04/27/2011   obstructed bile duct  2004   Dr Sharmaine Base   OOPHORECTOMY     BSO   REVERSE SHOULDER ARTHROPLASTY Left 02/14/2022   Procedure: LEFT REVERSE SHOULDER ARTHROPLASTY;  Surgeon: Vanetta Mulders, MD;  Location: [facility];  Service: Orthopedics;  Laterality: Left;   ROTATOR CUFF REPAIR Bilateral 12/2010   Left and right   Social History   Socioeconomic History   Marital status: Single    Spouse name: Not on file   Number of children: 1   Years of education: Not on file   Highest education level: Not on file  Occupational History   Occupation: MEAT CUTTER    Employer: [employer]  Tobacco Use   Smoking status: Never   Smokeless tobacco: Never  Vaping Use   Vaping Use: Never used  Substance and Sexual Activity   Alcohol use: Yes     Alcohol/week: 0.0 standard drinks of alcohol    Comment: Occas. wine   Drug use: No   Sexual activity: Not Currently    Partners: Male    Birth control/protection: Surgical    Comment: 1st intercourse 62 yo-More than 5 partners,hysterectomy  Other Topics Concern   Not on file  Social History Narrative   Not on file   Social Determinants of Health   Financial Resource Strain: Not on file  Food Insecurity: Not on file  Transportation Needs: Not on file  Physical Activity: Not on file  Stress: Not on file  Social Connections: Not on file   Family History  Problem Relation Age of Onset   Heart disease Mother 99       MI   Stroke Mother    Hypertension Mother    Arthritis Mother    Pulmonary embolism Mother    Prostate cancer Father    Hypertension Father    Arthritis Sister    Colon cancer Neg Hx    Colon polyps Neg Hx    Esophageal cancer Neg Hx    Rectal cancer Neg Hx  Stomach cancer Neg Hx    Breast cancer Neg Hx    No Known Allergies Current Outpatient Medications  Medication Sig Dispense Refill   aspirin EC 325 MG tablet Take 1 tablet (325 mg total) by mouth daily. 30 tablet 0   Cholecalciferol (VITAMIN D PO) Take 1 tablet by mouth 2 (two) times a week.     cyclobenzaprine (FLEXERIL) 5 MG tablet Take 1 tablet (5 mg total) by mouth at bedtime. (Patient taking differently: Take 5 mg by mouth daily as needed for muscle spasms.) 30 tablet 0   LORazepam (ATIVAN) 1 MG tablet Take 1 tablet (1 mg total) by mouth every 8 (eight) hours. 2 tablet 0   methocarbamol (ROBAXIN) 500 MG tablet Take 1 tablet (500 mg total) by mouth 2 (two) times daily. 20 tablet 0   methylPREDNISolone (MEDROL) 4 MG tablet Take as directed 21 tablet 0   oxyCODONE (OXY IR/ROXICODONE) 5 MG immediate release tablet Take 1 tablet (5 mg total) by mouth every 4 (four) hours as needed (severe pain). 20 tablet 0   traMADol (ULTRAM) 50 MG tablet Take 1 tablet (50 mg total) by mouth every 6 (six) hours as  needed. 30 tablet 0   No current facility-administered medications for this visit.   No results found.  Review of Systems:   A ROS was performed including pertinent positives and negatives as documented in the HPI.  Physical Exam :   Constitutional: NAD and appears stated age Neurological: Alert and oriented Psych: Appropriate affect and cooperative There were no vitals taken for this visit.   Comprehensive Musculoskeletal Exam:    Inspection Right Left  Skin No atrophy or gross abnormalities appreciated No atrophy or gross abnormalities appreciated  Palpation    Tenderness None None  Crepitus None None  Range of Motion    Flexion (passive) 120 120  Extension 30 30  IR 30 30 no pain  ER 40 45 with pain lateral trochanter  Strength    Flexion  5/5 5/5  Extension 5/5 5/5  Special Tests    FABER Negative Negative  FADIR Negative Negative  ER Lag/Capsular Insufficiency Negative Negative  Instability Negative Negative  Sacroiliac pain Negative  Negative   Instability    Generalized Laxity No No  Neurologic    sciatic, femoral, obturator nerves intact to light sensation  Vascular/Lymphatic    DP pulse 2+ 2+  Lumbar Exam    Patient has symmetric lumbar range of motion with negative pain referral to hip     Imaging:   Xray (2 views left hip): Mild degenerative findings about the femoral acetabular joint  MRI (lumbar spine): There are some degenerative changes without any significant evidence of neural compression  I personally reviewed and interpreted the radiographs.   Assessment:   62 y.o. female with left hip pain radiating around the side.  I did describe that this may be consistent more with gluteal pathology given the fact that her MRI of the lumbar spine is very normal.  At this time given the fact that she has persistent Trendelenburg gait I have showed her some exercises online that she may proceed with some gluteal strengthening.  I do believe she would  benefit from an MRI of the left hip as well as formal physical therapy as well.  She would like to research her insurance information further as she has recently switched and she will get back to [place] whether or not we should proceed with these.  Plan :    -  She will send [place] a MyChart message after she has researched her insurance      I personally saw and evaluated the patient, and participated in the management and treatment plan.  Vanetta Mulders, MD Attending Physician, Orthopedic Surgery  This document was dictated using Dragon voice recognition software. A reasonable attempt at proof reading has been made to minimize errors.

## 2022-09-28 ENCOUNTER — Ambulatory Visit (HOSPITAL_BASED_OUTPATIENT_CLINIC_OR_DEPARTMENT_OTHER)
Admission: RE | Admit: 2022-09-28 | Discharge: 2022-09-28 | Disposition: A | Discharge status: Home / Self Care | Payer: 59 | Source: Ambulatory Visit | Attending: Family Medicine | Admitting: Family Medicine

## 2022-09-28 ENCOUNTER — Encounter: Payer: Self-pay | Admitting: Family Medicine

## 2022-09-28 ENCOUNTER — Other Ambulatory Visit: Payer: Self-pay | Admitting: Family Medicine

## 2022-09-28 ENCOUNTER — Ambulatory Visit (INDEPENDENT_AMBULATORY_CARE_PROVIDER_SITE_OTHER): Payer: 59 | Admitting: Family Medicine

## 2022-09-28 VITALS — BP 98/70 | HR 105 | Temp 99.8°F | Resp 20 | Ht 65.0 in | Wt 192.6 lb

## 2022-09-28 DIAGNOSIS — M79661 Pain in right lower leg: Secondary | ICD-10-CM

## 2022-09-28 DIAGNOSIS — U071 COVID-19: Secondary | ICD-10-CM | POA: Diagnosis not present

## 2022-09-28 DIAGNOSIS — M79662 Pain in left lower leg: Secondary | ICD-10-CM | POA: Insufficient documentation

## 2022-09-28 DIAGNOSIS — M7989 Other specified soft tissue disorders: Secondary | ICD-10-CM | POA: Diagnosis not present

## 2022-09-28 DIAGNOSIS — R051 Acute cough: Secondary | ICD-10-CM | POA: Diagnosis not present

## 2022-09-28 LAB — POC COVID19 BINAXNOW: SARS Coronavirus 2 Ag: POSITIVE — AB

## 2022-09-28 LAB — POC INFLUENZA A&B (BINAX/QUICKVUE)
Influenza A, POC: NEGATIVE
Influenza B, POC: NEGATIVE

## 2022-09-28 MED ORDER — PROMETHAZINE-DM 6.25-15 MG/5ML PO SYRP: 5.0000 mL | ORAL_SOLUTION | Freq: Four times a day (QID) | ORAL | 0 refills | Status: DC | PRN

## 2022-09-28 MED ORDER — NIRMATRELVIR/RITONAVIR (PAXLOVID)TABLET: 3.0000 | ORAL_TABLET | Freq: Two times a day (BID) | ORAL | 0 refills | Status: AC

## 2022-09-28 NOTE — Progress Notes (Addendum)
Subjective:   By signing my name below, I, Shehryar Baig, attest that this documentation has been prepared under the direction and in the presence of Ann Held, DO. 09/28/2022   Patient ID: [name], female    DOB: [birth year], 62 y.o.   MRN: [id number]  Chief Complaint  Patient presents with   Cough    Sxs started Monday. No COVID test, pt states having productive cough, chills, headache,no appetite, fatigue. Pt using Tylenol and Mucinex    Cough Associated symptoms include chills, a fever, headaches (while coughing) and a sore throat. Pertinent negatives include no chest pain, rash or shortness of breath.   Patient is in today for a office visit.  She complains of cough, fatigue, decreased appetite, headache, congestion, sore throat, chills and fever since 09/25/2022. She is coughing up white mucous. She has a headache while coughing. She notes that while at the beauty salon her beauticians son was sick and around her frequently. She has never had Covid-19 in the past. She did not test for Covid-19.  She also complains of leg swelling and leg cramping. She has tenderness to palpation. She is drinking more water to manage her symptoms and finds no relief. She has a history of sciatica.    Past Medical History:  Diagnosis Date   Arthritis    rheumatoid per pt    Past Surgical History:  Procedure Laterality Date   ABDOMINAL HYSTERECTOMY  2001   TAH/BSO fibroids   CATARACT EXTRACTION Right    COLONOSCOPY  04/27/2011   obstructed bile duct  2004   Dr Sharmaine Base   OOPHORECTOMY     BSO   REVERSE SHOULDER ARTHROPLASTY Left 02/14/2022   Procedure: LEFT REVERSE SHOULDER ARTHROPLASTY;  Surgeon: Vanetta Mulders, MD;  Location: [facility];  Service: Orthopedics;  Laterality: Left;   ROTATOR CUFF REPAIR Bilateral 12/2010   Left and right    Family History  Problem Relation Age of Onset   Heart disease Mother 12       MI   Stroke Mother    Hypertension Mother     Arthritis Mother    Pulmonary embolism Mother    Prostate cancer Father    Hypertension Father    Arthritis Sister    Colon cancer Neg Hx    Colon polyps Neg Hx    Esophageal cancer Neg Hx    Rectal cancer Neg Hx    Stomach cancer Neg Hx    Breast cancer Neg Hx     Social History   Socioeconomic History   Marital status: Single    Spouse name: Not on file   Number of children: 1   Years of education: Not on file   Highest education level: Not on file  Occupational History   Occupation: MEAT CUTTER    Employer: [employer]  Tobacco Use   Smoking status: Never   Smokeless tobacco: Never  Vaping Use   Vaping Use: Never used  Substance and Sexual Activity   Alcohol use: Yes    Alcohol/week: 0.0 standard drinks of alcohol    Comment: Occas. wine   Drug use: No   Sexual activity: Not Currently    Partners: Male    Birth control/protection: Surgical    Comment: 1st intercourse 62 yo-More than 5 partners,hysterectomy  Other Topics Concern   Not on file  Social History Narrative   Not on file   Social Determinants of Health   Financial Resource Strain: Not on  file  Food Insecurity: Not on file  Transportation Needs: Not on file  Physical Activity: Not on file  Stress: Not on file  Social Connections: Not on file  Intimate Partner Violence: Not on file    Outpatient Medications Prior to Visit  Medication Sig Dispense Refill   Cholecalciferol (VITAMIN D PO) Take 1 tablet by mouth 2 (two) times a week.     cyclobenzaprine (FLEXERIL) 5 MG tablet Take 1 tablet (5 mg total) by mouth at bedtime. (Patient taking differently: Take 5 mg by mouth daily as needed for muscle spasms.) 30 tablet 0   LORazepam (ATIVAN) 1 MG tablet Take 1 tablet (1 mg total) by mouth every 8 (eight) hours. 2 tablet 0   aspirin EC 325 MG tablet Take 1 tablet (325 mg total) by mouth daily. (Patient not taking: Reported on 09/28/2022) 30 tablet 0   methocarbamol (ROBAXIN) 500 MG tablet Take 1 tablet  (500 mg total) by mouth 2 (two) times daily. (Patient not taking: Reported on 09/28/2022) 20 tablet 0   oxyCODONE (OXY IR/ROXICODONE) 5 MG immediate release tablet Take 1 tablet (5 mg total) by mouth every 4 (four) hours as needed (severe pain). (Patient not taking: Reported on 09/28/2022) 20 tablet 0   traMADol (ULTRAM) 50 MG tablet Take 1 tablet (50 mg total) by mouth every 6 (six) hours as needed. (Patient not taking: Reported on 09/28/2022) 30 tablet 0   methylPREDNISolone (MEDROL) 4 MG tablet Take as directed 21 tablet 0   No facility-administered medications prior to visit.    No Known Allergies  Review of Systems  Constitutional:  Positive for chills, fever and malaise/fatigue.       (+)decreased appetite  HENT:  Positive for congestion and sore throat.   Eyes:  Negative for blurred vision.  Respiratory:  Positive for cough and sputum production (white). Negative for shortness of breath.   Cardiovascular:  Negative for chest pain, palpitations and leg swelling.  Gastrointestinal:  Negative for vomiting.  Musculoskeletal:  Negative for back pain.  Skin:  Negative for rash.  Neurological:  Positive for headaches (while coughing). Negative for loss of consciousness.       Objective:    Physical Exam Vitals and nursing note reviewed.  Constitutional:      General: She is not in acute distress.    Appearance: Normal appearance. She is not ill-appearing.  HENT:     Head: Normocephalic and atraumatic.     Right Ear: Tympanic membrane, ear canal and external ear normal.     Left Ear: Tympanic membrane, ear canal and external ear normal.     Mouth/Throat:     Mouth: Mucous membranes are moist.     Pharynx: Pharyngeal swelling and posterior oropharyngeal erythema present.  Eyes:     Extraocular Movements: Extraocular movements intact.     Pupils: Pupils are equal, round, and reactive to light.  Cardiovascular:     Rate and Rhythm: Normal rate and regular rhythm.     Heart sounds:  Normal heart sounds. No murmur heard.    No gallop.  Pulmonary:     Effort: Pulmonary effort is normal. No respiratory distress.     Breath sounds: No wheezing or rales.     Comments: Test positive for Covid-19 Musculoskeletal:     Cervical back: Normal range of motion.     Right lower leg: Tenderness (tenderness to palpation) present.     Left lower leg: No tenderness (tenderness to palpation).  Comments: +calf pain R low ext  +1 edema   Skin:    General: Skin is warm and dry.  Neurological:     Mental Status: She is alert and oriented to person, place, and time.  Psychiatric:        Judgment: Judgment normal.     BP 98/70 (BP Location: Right Arm, Patient Position: Sitting, Cuff Size: Large)   Pulse (!) 105   Temp 99.8 F (37.7 C) (Oral)   Resp 20   Ht '5\' 5"'$  (1.651 m)   Wt 192 lb 9.6 oz (87.4 kg)   SpO2 98%   BMI 32.05 kg/m  Wt Readings from Last 3 Encounters:  09/28/22 192 lb 9.6 oz (87.4 kg)  02/14/22 175 lb (79.4 kg)  02/02/22 182 lb (82.6 kg)       Assessment & Plan:  COVID-19 Assessment & Plan: Antiviral sent in  And cough med Quarantine 5 days then she can be around people with a mask on for 5 more days  Call or return to office if symptoms do not improve   Orders: -     nirmatrelvir/ritonavir; Take 3 tablets by mouth 2 (two) times daily for 5 days. (Take nirmatrelvir 150 mg two tablets twice daily for 5 days and ritonavir 100 mg one tablet twice daily for 5 days) Patient GFR is 95  Dispense: 30 tablet; Refill: 0 -     Promethazine-DM; Take 5 mLs by mouth 4 (four) times daily as needed.  Dispense: 118 mL; Refill: 0  Acute cough -     POC COVID-19 BinaxNow -     POC Influenza A&B(BINAX/QUICKVUE) -     Promethazine-DM; Take 5 mLs by mouth 4 (four) times daily as needed.  Dispense: 118 mL; Refill: 0  Right calf pain -     US Venous Img Lower Unilateral Right (DVT); Future    I, Ann Held, DO, personally preformed the services described in  this documentation.  All medical record entries made by the scribe were at my direction and in my presence.  I have reviewed the chart and discharge instructions (if applicable) and agree that the record reflects my personal performance and is accurate and complete. 09/28/2022   I,Shehryar Baig,acting as a scribe for Ann Held, DO.,have documented all relevant documentation on the behalf of Ann Held, DO,as directed by  Ann Held, DO while in the presence of Ann Held, DO.   Ann Held, DO

## 2022-09-28 NOTE — Assessment & Plan Note (Addendum)
Antiviral sent in  And cough med Quarantine 5 days then she can be around people with a mask on for 5 more days  Call or return to office if symptoms do not improve

## 2022-10-02 ENCOUNTER — Emergency Department (HOSPITAL_COMMUNITY): Payer: 59

## 2022-10-02 ENCOUNTER — Emergency Department (HOSPITAL_COMMUNITY)
Admission: EM | Admit: 2022-10-02 | Discharge: 2022-10-02 | Disposition: A | Discharge status: Home / Self Care | Payer: 59 | Attending: Emergency Medicine | Admitting: Emergency Medicine

## 2022-10-02 ENCOUNTER — Encounter (HOSPITAL_COMMUNITY): Payer: Self-pay

## 2022-10-02 ENCOUNTER — Other Ambulatory Visit: Payer: Self-pay

## 2022-10-02 DIAGNOSIS — Z96612 Presence of left artificial shoulder joint: Secondary | ICD-10-CM | POA: Diagnosis not present

## 2022-10-02 DIAGNOSIS — J9811 Atelectasis: Secondary | ICD-10-CM | POA: Diagnosis not present

## 2022-10-02 DIAGNOSIS — J988 Other specified respiratory disorders: Secondary | ICD-10-CM

## 2022-10-02 DIAGNOSIS — R0602 Shortness of breath: Secondary | ICD-10-CM

## 2022-10-02 DIAGNOSIS — Z8616 Personal history of COVID-19: Secondary | ICD-10-CM | POA: Insufficient documentation

## 2022-10-02 DIAGNOSIS — J069 Acute upper respiratory infection, unspecified: Secondary | ICD-10-CM | POA: Diagnosis not present

## 2022-10-02 DIAGNOSIS — U071 COVID-19: Secondary | ICD-10-CM | POA: Insufficient documentation

## 2022-10-02 LAB — BASIC METABOLIC PANEL
Anion gap: 14 (ref 5–15)
BUN: 15 mg/dL (ref 8–23)
CO2: 20 mmol/L — ABNORMAL LOW (ref 22–32)
Calcium: 9 mg/dL (ref 8.9–10.3)
Chloride: 100 mmol/L (ref 98–111)
Creatinine, Ser: 0.87 mg/dL (ref 0.44–1.00)
GFR, Estimated: >60 mL/min (ref >60)
Glucose, Bld: 107 mg/dL — ABNORMAL HIGH (ref 70–99)
Potassium: 4.2 mmol/L (ref 3.5–5.1)
Sodium: 134 mmol/L — ABNORMAL LOW (ref 135–145)

## 2022-10-02 LAB — CBC WITH DIFFERENTIAL/PLATELET
Abs Immature Granulocytes: 0.02 10*3/uL (ref 0.00–0.07)
Basophils Absolute: 0 10*3/uL (ref 0.0–0.1)
Basophils Relative: 0 %
Eosinophils Absolute: 0 10*3/uL (ref 0.0–0.5)
Eosinophils Relative: 0 %
HCT: 41.6 % (ref 36.0–46.0)
Hemoglobin: 13.4 g/dL (ref 12.0–15.0)
Immature Granulocytes: 0 %
Lymphocytes Relative: 49 %
Lymphs Abs: 3.9 10*3/uL (ref 0.7–4.0)
MCH: 28.4 pg (ref 26.0–34.0)
MCHC: 32.2 g/dL (ref 30.0–36.0)
MCV: 88.1 fL (ref 80.0–100.0)
Monocytes Absolute: 0.3 10*3/uL (ref 0.1–1.0)
Monocytes Relative: 4 %
Neutro Abs: 3.7 10*3/uL (ref 1.7–7.7)
Neutrophils Relative %: 47 %
Platelets: 202 10*3/uL (ref 150–400)
RBC: 4.72 MIL/uL (ref 3.87–5.11)
RDW: 12.9 % (ref 11.5–15.5)
WBC: 7.9 10*3/uL (ref 4.0–10.5)
nRBC: 0 % (ref 0.0–0.2)

## 2022-10-02 LAB — TROPONIN I (HIGH SENSITIVITY): Troponin I (High Sensitivity): 5 ng/L (ref <18)

## 2022-10-02 LAB — D-DIMER, QUANTITATIVE: D-Dimer, Quant: 1.9 ug/mL-FEU — ABNORMAL HIGH (ref 0.00–0.50)

## 2022-10-02 MED ORDER — KETOROLAC TROMETHAMINE 15 MG/ML IJ SOLN
15.0000 mg | Freq: Once | INTRAMUSCULAR | Status: AC
  Administered 2022-10-02: 15 mg via INTRAVENOUS
  Filled 2022-10-02: qty 1

## 2022-10-02 MED ORDER — ONDANSETRON HCL 4 MG/2ML IJ SOLN
4.0000 mg | Freq: Once | INTRAMUSCULAR | Status: AC
Start: 2022-10-02 — End: 2022-10-02
  Administered 2022-10-02: 4 mg via INTRAVENOUS
  Filled 2022-10-02: qty 2

## 2022-10-02 MED ORDER — IOHEXOL 350 MG/ML SOLN
75.0000 mL | Freq: Once | INTRAVENOUS | Status: AC | PRN
  Administered 2022-10-02: 75 mL via INTRAVENOUS

## 2022-10-02 NOTE — ED Triage Notes (Addendum)
Covid + since 1/25 but cough, dizziness and body aches began the previous Tuesday.   Comes to ED POV with son for sudden worsening shob and chest "burning" sensation.   Says tonight when she stood up it appeared as spiders all over the wall. Admits she knew they were not present.

## 2022-10-02 NOTE — ED Provider Notes (Signed)
[facility] DEPT Provider Note: Georgena Spurling, MD, FACEP  CSN: [id number] MRN: [id number] ARRIVAL: 10/02/22 at [place]: [place]   HISTORY OF PRESENT ILLNESS  10/02/22 1:26 AM [name] is a 62 y.o. female who became ill 6 days ago.  Specifically she had cough, fever, general malaise, nausea, loss of appetite and loss of taste and smell.  She was seen by her PCP, Dr. Roma Schanz on 09/28/2022 and diagnosed with COVID.  She was negative for influenza a and B.  She was started on Paxlovid but only took 1 dose due to GI upset.  She has been taking over-the-counter cough and cold medications.  Her symptoms were improving until about 8 PM yesterday evening when she became acutely short of breath.  She is also having a burning sensation in her anterior chest which she rates as a 7 out of 10.  She was noted to be tachypneic on arrival but had an oxygen saturation of 100%.  Prior to arrival she was hallucinating spiders crawling on the wall but was aware that these were not real.  She states she is lightheaded on standing.   Past Medical History:  Diagnosis Date   Arthritis    rheumatoid per pt    Past Surgical History:  Procedure Laterality Date   ABDOMINAL HYSTERECTOMY  2001   TAH/BSO fibroids   CATARACT EXTRACTION Right    COLONOSCOPY  04/27/2011   obstructed bile duct  2004   Dr Sharmaine Base   OOPHORECTOMY     BSO   REVERSE SHOULDER ARTHROPLASTY Left 02/14/2022   Procedure: LEFT REVERSE SHOULDER ARTHROPLASTY;  Surgeon: Vanetta Mulders, MD;  Location: [facility];  Service: Orthopedics;  Laterality: Left;   ROTATOR CUFF REPAIR Bilateral 12/2010   Left and right    Family History  Problem Relation Age of Onset   Heart disease Mother 53       MI   Stroke Mother    Hypertension Mother    Arthritis Mother    Pulmonary embolism Mother    Prostate cancer Father    Hypertension Father    Arthritis Sister    Colon cancer Neg Hx     Colon polyps Neg Hx    Esophageal cancer Neg Hx    Rectal cancer Neg Hx    Stomach cancer Neg Hx    Breast cancer Neg Hx     Social History   Tobacco Use   Smoking status: Never   Smokeless tobacco: Never  Vaping Use   Vaping Use: Never used  Substance Use Topics   Alcohol use: Yes    Alcohol/week: 0.0 standard drinks of alcohol    Comment: Occas. wine   Drug use: No    Prior to Admission medications   Medication Sig Start Date End Date Taking? Authorizing Provider  Cholecalciferol (VITAMIN D PO) Take 1 tablet by mouth 2 (two) times a week.    [provider]  cyclobenzaprine (FLEXERIL) 5 MG tablet Take 1 tablet (5 mg total) by mouth at bedtime. Patient taking differently: Take 5 mg by mouth daily as needed for muscle spasms. 01/06/22   Glennon Mac, DO  LORazepam (ATIVAN) 1 MG tablet Take 1 tablet (1 mg total) by mouth every 8 (eight) hours. 12/13/21   Vanetta Mulders, MD  nirmatrelvir/ritonavir (PAXLOVID) 20 x 150 MG & 10 x '100MG'$  TABS Take 3 tablets by mouth 2 (two) times daily for 5 days. (Take nirmatrelvir 150  mg two tablets twice daily for 5 days and ritonavir 100 mg one tablet twice daily for 5 days) Patient GFR is 95 09/28/22 10/03/22  Lowne Chase, Alferd Apa, DO  promethazine-dextromethorphan (PROMETHAZINE-DM) 6.25-15 MG/5ML syrup Take 5 mLs by mouth 4 (four) times daily as needed. 09/28/22   Ann Held, DO    Allergies Patient has no known allergies.   REVIEW OF SYSTEMS  Negative except as noted here or in the History of Present Illness.   PHYSICAL EXAMINATION  Initial Vital Signs Blood pressure (!) 162/88, pulse 72, temperature 98 F (36.7 C), temperature source Oral, resp. rate (!) 25, height '5\' 5"'$  (1.651 m), weight 87.1 kg, SpO2 100 %.  Examination General: Well-developed, well-nourished female in mild distress; appearance consistent with age of record HENT: normocephalic; atraumatic Eyes: Normal appearance Neck: supple Heart: regular rate  and rhythm Lungs: clear to auscultation bilaterally; tachypnea Abdomen: soft; nondistended; nontender; bowel sounds present Extremities: No deformity; full range of motion; pulses normal Neurologic: Awake, alert and oriented; motor function intact in all extremities and symmetric; no facial droop Skin: Warm and dry Psychiatric: Flat affect   RESULTS  Summary of this visit's results, reviewed and interpreted by myself:   EKG Interpretation  Date/Time:  Monday October 02 2022 01:23:44 EST Ventricular Rate:  76 PR Interval:  161 QRS Duration: 101 QT Interval:  405 QTC Calculation: 456 R Axis:   55 Text Interpretation: Sinus rhythm No significant change was found Confirmed by Shanon Rosser [phone]) on 10/02/2022 1:26:19 AM       Laboratory Studies: Results for orders placed or performed during the hospital encounter of 10/02/22 (from the past 24 hour(s))  CBC with Differential     Status: None   Collection Time: 10/02/22  1:41 AM  Result Value Ref Range   WBC 7.9 4.0 - 10.5 K/uL   RBC 4.72 3.87 - 5.11 MIL/uL   Hemoglobin 13.4 12.0 - 15.0 g/dL   HCT 41.6 36.0 - 46.0 %   MCV 88.1 80.0 - 100.0 fL   MCH 28.4 26.0 - 34.0 pg   MCHC 32.2 30.0 - 36.0 g/dL   RDW 12.9 11.5 - 15.5 %   Platelets 202 150 - 400 K/uL   nRBC 0.0 0.0 - 0.2 %   Neutrophils Relative % 47 %   Neutro Abs 3.7 1.7 - 7.7 K/uL   Lymphocytes Relative 49 %   Lymphs Abs 3.9 0.7 - 4.0 K/uL   Monocytes Relative 4 %   Monocytes Absolute 0.3 0.1 - 1.0 K/uL   Eosinophils Relative 0 %   Eosinophils Absolute 0.0 0.0 - 0.5 K/uL   Basophils Relative 0 %   Basophils Absolute 0.0 0.0 - 0.1 K/uL   Immature Granulocytes 0 %   Abs Immature Granulocytes 0.02 0.00 - 0.07 K/uL  Basic metabolic panel     Status: Abnormal   Collection Time: 10/02/22  1:41 AM  Result Value Ref Range   Sodium 134 (L) 135 - 145 mmol/L   Potassium 4.2 3.5 - 5.1 mmol/L   Chloride 100 98 - 111 mmol/L   CO2 20 (L) 22 - 32 mmol/L   Glucose, Bld 107  (H) 70 - 99 mg/dL   BUN 15 8 - 23 mg/dL   Creatinine, Ser 0.87 0.44 - 1.00 mg/dL   Calcium 9.0 8.9 - 10.3 mg/dL   GFR, Estimated >60 >60 mL/min   Anion gap 14 5 - 15  Troponin I (High Sensitivity)     Status:  None   Collection Time: 10/02/22  1:41 AM  Result Value Ref Range   Troponin I (High Sensitivity) 5 <18 ng/L  D-dimer, quantitative     Status: Abnormal   Collection Time: 10/02/22  1:41 AM  Result Value Ref Range   D-Dimer, Quant 1.90 (H) 0.00 - 0.50 ug/mL-FEU   Imaging Studies: CT Angio Chest PE W and/or Wo Contrast  Result Date: 10/02/2022 CLINICAL DATA:  62 year old female diagnosed with COVID-19 09/28/2022. Increasing shortness of breath, burning sensation. EXAM: CT ANGIOGRAPHY CHEST WITH CONTRAST TECHNIQUE: Multidetector CT imaging of the chest was performed using the standard protocol during bolus administration of intravenous contrast. Multiplanar CT image reconstructions and MIPs were obtained to evaluate the vascular anatomy. RADIATION DOSE REDUCTION: This exam was performed according to the departmental dose-optimization program which includes automated exposure control, adjustment of the mA and/or kV according to patient size and/or use of iterative reconstruction technique. CONTRAST:  10m OMNIPAQUE IOHEXOL 350 MG/ML SOLN COMPARISON:  Portable chest 0155 hours today. CT Abdomen and Pelvis 05/05/2017. FINDINGS: Cardiovascular: Good contrast bolus timing in the pulmonary arterial tree. No focal filling defect identified in the pulmonary arteries to suggest acute pulmonary embolism. Calcified coronary artery atherosclerosis on series 5, image 129. Heart size is stable since 2018 at the upper limits of normal. No pericardial effusion. No significant aortic contrast. Tortuous thoracic aorta, minimal to mild Calcified aortic atherosclerosis. Mediastinum/Nodes: No mediastinal mass or lymphadenopathy. Lungs/Pleura: Major airways are patent. Somewhat low lung volumes with symmetric  dependent and lower lobe atelectasis. No consolidation, pleural effusion, or convincing lung inflammation. Upper Abdomen: Visible noncontrast liver, spleen, pancreas, adrenal glands, kidneys and stomach appear negative. Chronic diverticulosis of large bowel at the splenic flexure. Musculoskeletal: Left shoulder arthroplasty. Widespread thoracic disc and endplate degeneration. No acute osseous abnormality identified. Review of the MIP images confirms the above findings. IMPRESSION: 1. Negative for acute pulmonary embolus. 2. Atelectasis. No evidence of COVID-19 pneumonia. No pleural effusion. Electronically Signed   By: HGenevie AnnM.D.   On: 10/02/2022 04:47   DG Chest Port 1 View  Result Date: 10/02/2022 CLINICAL DATA:  Shortness of breath, COVID positive EXAM: PORTABLE CHEST 1 VIEW COMPARISON:  12/28/2021 FINDINGS: Lungs are clear.  No pleural effusion or pneumothorax. The heart is top-normal in size. Left shoulder arthroplasty. IMPRESSION: No evidence of acute cardiopulmonary disease. Electronically Signed   By: SJulian HyM.D.   On: 10/02/2022 02:08    ED COURSE and MDM  Nursing notes, initial and subsequent vitals signs, including pulse oximetry, reviewed and interpreted by myself.  Vitals:   10/02/22 0125 10/02/22 0130 10/02/22 0200 10/02/22 0400  BP:  (!) 144/89 (!) 144/84 (!) 151/88  Pulse:  80 60 64  Resp:  (!) '21 15 18  '$ Temp: 98 F (36.7 C)     TempSrc: Oral     SpO2:  95% 100% 94%  Weight:      Height:       Medications  ondansetron (ZOFRAN) injection 4 mg (4 mg Intravenous Given 10/02/22 0139)  ketorolac (TORADOL) 15 MG/ML injection 15 mg (15 mg Intravenous Given 10/02/22 0308)  iohexol (OMNIPAQUE) 350 MG/ML injection 75 mL (75 mLs Intravenous Contrast Given 10/02/22 0418)   3:12 AM Patient's lungs are clear but she was tachypneic on arrival.  Her oxygen saturation was normal and she is not tachycardic.  Nevertheless she does have an elevated D-dimer in the context of COVID  which means she has a risk for pulmonary embolism.  We  will obtain a CT angio chest.  5:36 AM No evidence of pulmonary embolism or pneumonia on chest CT.  Patient was given IV Toradol with significant improvement in her chest discomfort and sensation of shortness of breath.  I suspect her shortness of breath may be due to irritation of her lungs without actual bronchospasm or infiltrate.  She was advised to take over-the-counter NSAIDs as needed or to return if symptoms worsen.   PROCEDURES  Procedures   ED DIAGNOSES     ICD-10-CM   1. Shortness of breath  R06.02     2. Respiratory tract infection due to COVID-19 virus  U07.1    J98.8          Kevonna Nolte, MD 10/02/22 [phone]

## 2022-11-09 ENCOUNTER — Encounter: Payer: Self-pay | Admitting: Radiology

## 2023-01-18 ENCOUNTER — Encounter: Payer: Self-pay | Admitting: Family Medicine

## 2023-01-18 ENCOUNTER — Ambulatory Visit (INDEPENDENT_AMBULATORY_CARE_PROVIDER_SITE_OTHER): Payer: 59 | Admitting: Family Medicine

## 2023-01-18 VITALS — BP 108/88 | HR 60 | Temp 98.3°F | Resp 18 | Ht 65.0 in | Wt 200.6 lb

## 2023-01-18 DIAGNOSIS — M545 Low back pain, unspecified: Secondary | ICD-10-CM | POA: Diagnosis not present

## 2023-01-18 DIAGNOSIS — K922 Gastrointestinal hemorrhage, unspecified: Secondary | ICD-10-CM

## 2023-01-18 DIAGNOSIS — E559 Vitamin D deficiency, unspecified: Secondary | ICD-10-CM | POA: Diagnosis not present

## 2023-01-18 DIAGNOSIS — Z0000 Encounter for general adult medical examination without abnormal findings: Secondary | ICD-10-CM

## 2023-01-18 DIAGNOSIS — Z136 Encounter for screening for cardiovascular disorders: Secondary | ICD-10-CM

## 2023-01-18 DIAGNOSIS — Z23 Encounter for immunization: Secondary | ICD-10-CM | POA: Diagnosis not present

## 2023-01-18 DIAGNOSIS — K625 Hemorrhage of anus and rectum: Secondary | ICD-10-CM | POA: Diagnosis not present

## 2023-01-18 LAB — POC URINALSYSI DIPSTICK (AUTOMATED)
Bilirubin, UA: NEGATIVE
Blood, UA: NEGATIVE
Glucose, UA: NEGATIVE
Ketones, UA: NEGATIVE
Leukocytes, UA: NEGATIVE
Nitrite, UA: NEGATIVE
Protein, UA: NEGATIVE
Spec Grav, UA: 1.02 (ref 1.010–1.025)
Urobilinogen, UA: 0.2 E.U./dL
pH, UA: 5 (ref 5.0–8.0)

## 2023-01-18 NOTE — Progress Notes (Addendum)
Subjective:   By signing my name below, I, Shehryar Baig, attest that this documentation has been prepared under the direction and in the presence of Donato Schultz, DO. 01/18/2023   Patient ID: [name], female    DOB: 1961/04/28, 62 y.o.   MRN: [id number]  Chief Complaint  Patient presents with   Annual Exam    Pt states fasting     HPI Patient is in today for a comprehensive physical exam.  She complains of feeling diarrhea last week but found she had only moved clear blood during her bowel movement. The next day she was having dark stools. She also developed lower back pain. She does not feel her symptoms are related to hemorrhoids.  She denies fever, new moles, congestion, sinus pain, sore throat, chest pain, palpitations, cough, shortness of breath, wheezing, nausea, vomiting, abdominal pain, constipation, dysuria, frequency, hematuria, new joint pain, or headaches at this time.  She has no changes to her family medical history. She has no new surgeries to report.  She is exercising regularly by doing water aerobic exercise designed for arthritis.  Mammogram was last completed 04/27/2022. Results are normal. Repeat in 1 year.  She is due for a tetanus vaccine and is interested in receiving it. She is eligible for the shingles vaccines and is interested in receiving the first dose during this visit.  Colonoscopy was last completed 09/21/2021. Results showed - Diverticulosis in the sigmoid colon and in the descending colon. - Two 2 to 3 mm polyps in the descending colon, removed with a cold snare. Resected and retrieved. - Two 1 to 3 mm polyps in the ascending colon, removed with a cold snare. Resected and removed. - The examination was otherwise normal on direct and retroflexion views.   Past Medical History:  Diagnosis Date   Arthritis    rheumatoid per pt    Past Surgical History:  Procedure Laterality Date   ABDOMINAL HYSTERECTOMY  2001   TAH/BSO fibroids    CATARACT EXTRACTION Right    COLONOSCOPY  04/27/2011   obstructed bile duct  2004   Dr Micki Riley   OOPHORECTOMY     BSO   REVERSE SHOULDER ARTHROPLASTY Left 02/14/2022   Procedure: LEFT REVERSE SHOULDER ARTHROPLASTY;  Surgeon: Huel Cote, MD;  Location: [facility];  Service: Orthopedics;  Laterality: Left;   ROTATOR CUFF REPAIR Bilateral 12/2010   Left and right    Family History  Problem Relation Age of Onset   Heart disease Mother 28       MI   Stroke Mother    Hypertension Mother    Arthritis Mother    Pulmonary embolism Mother    Prostate cancer Father    Hypertension Father    Arthritis Sister    Colon cancer Neg Hx    Colon polyps Neg Hx    Esophageal cancer Neg Hx    Rectal cancer Neg Hx    Stomach cancer Neg Hx    Breast cancer Neg Hx     Social History   Socioeconomic History   Marital status: Single    Spouse name: Not on file   Number of children: 1   Years of education: Not on file   Highest education level: Not on file  Occupational History   Occupation: MEAT CUTTER    Employer: [employer]  Tobacco Use   Smoking status: Never   Smokeless tobacco: Never  Vaping Use   Vaping Use: Never used  Substance  and Sexual Activity   Alcohol use: Yes    Alcohol/week: 0.0 standard drinks of alcohol    Comment: Occas. wine   Drug use: No   Sexual activity: Not Currently    Partners: Male    Birth control/protection: Surgical    Comment: 1st intercourse 62 yo-More than 5 partners,hysterectomy  Other Topics Concern   Not on file  Social History Narrative   Exercise-- water aerobics   Social Determinants of Health   Financial Resource Strain: Not on file  Food Insecurity: Not on file  Transportation Needs: Not on file  Physical Activity: Not on file  Stress: Not on file  Social Connections: Not on file  Intimate Partner Violence: Not on file    Outpatient Medications Prior to Visit  Medication Sig Dispense Refill   Cholecalciferol (VITAMIN D PO)  Take 1 tablet by mouth 2 (two) times a week.     cyclobenzaprine (FLEXERIL) 5 MG tablet Take 1 tablet (5 mg total) by mouth at bedtime. (Patient taking differently: Take 5 mg by mouth daily as needed for muscle spasms.) 30 tablet 0   LORazepam (ATIVAN) 1 MG tablet Take 1 tablet (1 mg total) by mouth every 8 (eight) hours. 2 tablet 0   promethazine-dextromethorphan (PROMETHAZINE-DM) 6.25-15 MG/5ML syrup Take 5 mLs by mouth 4 (four) times daily as needed. (Patient not taking: Reported on 01/18/2023) 118 mL 0   No facility-administered medications prior to visit.    No Known Allergies  Review of Systems  Constitutional:  Negative for fever and malaise/fatigue.  HENT:  Negative for congestion, sinus pain and sore throat.   Eyes:  Negative for blurred vision.  Respiratory:  Negative for cough, shortness of breath and wheezing.   Cardiovascular:  Negative for chest pain, palpitations and leg swelling.  Gastrointestinal:  Positive for blood in stool and diarrhea. Negative for abdominal pain, constipation, nausea and vomiting.  Genitourinary:  Negative for dysuria, frequency and hematuria.  Musculoskeletal:  Negative for falls.       (+)lower back pain (-)new joint pain  Skin:  Negative for rash.       (-)New moles  Neurological:  Negative for dizziness, loss of consciousness and headaches.  Endo/Heme/Allergies:  Negative for environmental allergies.  Psychiatric/Behavioral:  Negative for depression. The patient is not nervous/anxious.        Objective:    Physical Exam Vitals and nursing note reviewed.  Constitutional:      General: She is not in acute distress.    Appearance: Normal appearance. She is not ill-appearing.  HENT:     Head: Normocephalic and atraumatic.     Right Ear: Tympanic membrane, ear canal and external ear normal.     Left Ear: Tympanic membrane, ear canal and external ear normal.  Eyes:     Extraocular Movements: Extraocular movements intact.     Pupils: Pupils  are equal, round, and reactive to light.  Cardiovascular:     Rate and Rhythm: Normal rate and regular rhythm.     Heart sounds: Normal heart sounds. No murmur heard.    No gallop.  Pulmonary:     Effort: Pulmonary effort is normal. No respiratory distress.     Breath sounds: Normal breath sounds. No wheezing or rales.  Abdominal:     General: Bowel sounds are normal. There is no distension.     Palpations: Abdomen is soft.     Tenderness: There is no abdominal tenderness. There is no guarding.  Genitourinary:  Rectum: Guaiac result negative.  Skin:    General: Skin is warm and dry.  Neurological:     General: No focal deficit present.     Mental Status: She is alert and oriented to person, place, and time.  Psychiatric:        Mood and Affect: Mood normal.        Judgment: Judgment normal.     BP 108/88 (BP Location: Left Arm, Patient Position: Sitting, Cuff Size: Large)   Pulse 60   Temp 98.3 F (36.8 C) (Oral)   Resp 18   Ht 5\' 5"  (1.651 m)   Wt 200 lb 9.6 oz (91 kg)   BMI 33.38 kg/m  Wt Readings from Last 3 Encounters:  01/18/23 200 lb 9.6 oz (91 kg)  10/02/22 192 lb (87.1 kg)  09/28/22 192 lb 9.6 oz (87.4 kg)       Assessment & Plan:  Preventative health care  Bright red blood per rectum -     CBC with Differential/Platelet -     TSH -     Ambulatory referral to Gastroenterology  Vitamin D deficiency -     VITAMIN D 25 Hydroxy (Vit-D Deficiency, Fractures)  Acute midline low back pain, unspecified whether sciatica present -     CBC with Differential/Platelet -     Comprehensive metabolic panel -     POCT Urinalysis Dipstick (Automated)  Ischemic heart disease screen -     Lipid panel -     TSH  Gastrointestinal hemorrhage, unspecified gastrointestinal hemorrhage type  Need for tetanus booster -     Tdap vaccine greater than or equal to 7yo IM  Need for shingles vaccine -     Varicella-zoster vaccine IM    I, Donato Schultz, DO,  personally preformed the services described in this documentation.  All medical record entries made by the scribe were at my direction and in my presence.  I have reviewed the chart and discharge instructions (if applicable) and agree that the record reflects my personal performance and is accurate and complete. 01/18/2023   I,Shehryar Baig,acting as a scribe for Donato Schultz, DO.,have documented all relevant documentation on the behalf of Donato Schultz, DO,as directed by  Donato Schultz, DO while in the presence of Donato Schultz, DO.   Donato Schultz, DO

## 2023-01-19 LAB — LIPID PANEL
Cholesterol: 269 mg/dL — ABNORMAL HIGH (ref 0–200)
HDL: 69.1 mg/dL (ref >39.00)
LDL Cholesterol: 178 mg/dL — ABNORMAL HIGH (ref 0–99)
NonHDL: 200.18
Total CHOL/HDL Ratio: 4
Triglycerides: 110 mg/dL (ref 0.0–149.0)
VLDL: 22 mg/dL (ref 0.0–40.0)

## 2023-01-19 LAB — CBC WITH DIFFERENTIAL/PLATELET
Basophils Absolute: 0.1 10*3/uL (ref 0.0–0.1)
Basophils Relative: 1 % (ref 0.0–3.0)
Eosinophils Absolute: 0 10*3/uL (ref 0.0–0.7)
Eosinophils Relative: 0.5 % (ref 0.0–5.0)
HCT: 35 % — ABNORMAL LOW (ref 36.0–46.0)
Hemoglobin: 11.4 g/dL — ABNORMAL LOW (ref 12.0–15.0)
Lymphocytes Relative: 44.5 % (ref 12.0–46.0)
Lymphs Abs: 2.3 10*3/uL (ref 0.7–4.0)
MCHC: 32.4 g/dL (ref 30.0–36.0)
MCV: 90.9 fl (ref 78.0–100.0)
Monocytes Absolute: 0.4 10*3/uL (ref 0.1–1.0)
Monocytes Relative: 6.7 % (ref 3.0–12.0)
Neutro Abs: 2.5 10*3/uL (ref 1.4–7.7)
Neutrophils Relative %: 47.3 % (ref 43.0–77.0)
Platelets: 231 10*3/uL (ref 150.0–400.0)
RBC: 3.85 Mil/uL — ABNORMAL LOW (ref 3.87–5.11)
RDW: 13.6 % (ref 11.5–15.5)
WBC: 5.2 10*3/uL (ref 4.0–10.5)

## 2023-01-19 LAB — COMPREHENSIVE METABOLIC PANEL
ALT: 14 U/L (ref 0–35)
AST: 22 U/L (ref 0–37)
Albumin: 4.3 g/dL (ref 3.5–5.2)
Alkaline Phosphatase: 95 U/L (ref 39–117)
BUN: 13 mg/dL (ref 6–23)
CO2: 27 mEq/L (ref 19–32)
Calcium: 9.2 mg/dL (ref 8.4–10.5)
Chloride: 104 mEq/L (ref 96–112)
Creatinine, Ser: 0.79 mg/dL (ref 0.40–1.20)
GFR: 79.87 mL/min (ref >60.00)
Glucose, Bld: 95 mg/dL (ref 70–99)
Potassium: 3.9 mEq/L (ref 3.5–5.1)
Sodium: 140 mEq/L (ref 135–145)
Total Bilirubin: 0.4 mg/dL (ref 0.2–1.2)
Total Protein: 7 g/dL (ref 6.0–8.3)

## 2023-01-19 LAB — VITAMIN D 25 HYDROXY (VIT D DEFICIENCY, FRACTURES): VITD: 9.78 ng/mL — ABNORMAL LOW (ref 30.00–100.00)

## 2023-01-19 LAB — TSH: TSH: 1.36 u[IU]/mL (ref 0.35–5.50)

## 2023-01-22 ENCOUNTER — Telehealth: Payer: Self-pay | Admitting: Family Medicine

## 2023-01-22 NOTE — Telephone Encounter (Signed)
Pt called to go over lab results. All CMAs were unavailable at the time and advised we would give the pt a call back to go over those at a later time.

## 2023-01-23 ENCOUNTER — Telehealth: Payer: Self-pay | Admitting: Family Medicine

## 2023-01-23 ENCOUNTER — Other Ambulatory Visit: Payer: Self-pay

## 2023-01-23 MED ORDER — VITAMIN D (ERGOCALCIFEROL) 1.25 MG (50000 UNIT) PO CAPS: 50000.0000 [IU] | ORAL_CAPSULE | ORAL | 1 refills | Status: DC

## 2023-01-23 NOTE — Telephone Encounter (Signed)
Spoke with patient. Pt verbalized understanding. Rx sent in 

## 2023-01-23 NOTE — Telephone Encounter (Signed)
Pt returned CMA's call regarding labs. Please call to discuss.

## 2023-01-31 ENCOUNTER — Telehealth: Payer: Self-pay | Admitting: Orthopaedic Surgery

## 2023-01-31 ENCOUNTER — Ambulatory Visit (INDEPENDENT_AMBULATORY_CARE_PROVIDER_SITE_OTHER): Payer: 59

## 2023-01-31 ENCOUNTER — Other Ambulatory Visit (HOSPITAL_BASED_OUTPATIENT_CLINIC_OR_DEPARTMENT_OTHER): Payer: Self-pay

## 2023-01-31 ENCOUNTER — Ambulatory Visit (HOSPITAL_BASED_OUTPATIENT_CLINIC_OR_DEPARTMENT_OTHER): Payer: 59 | Admitting: Orthopaedic Surgery

## 2023-01-31 DIAGNOSIS — M25562 Pain in left knee: Secondary | ICD-10-CM | POA: Diagnosis not present

## 2023-01-31 DIAGNOSIS — Z96642 Presence of left artificial hip joint: Secondary | ICD-10-CM

## 2023-01-31 DIAGNOSIS — M67959 Unspecified disorder of synovium and tendon, unspecified thigh: Secondary | ICD-10-CM

## 2023-01-31 DIAGNOSIS — M1612 Unilateral primary osteoarthritis, left hip: Secondary | ICD-10-CM | POA: Diagnosis not present

## 2023-01-31 DIAGNOSIS — M25552 Pain in left hip: Secondary | ICD-10-CM

## 2023-01-31 MED ORDER — TRAMADOL HCL 50 MG PO TABS
50.0000 mg | ORAL_TABLET | Freq: Four times a day (QID) | ORAL | 2 refills | Status: DC | PRN
  Filled 2023-01-31: qty 28, 7d supply, fill #0

## 2023-01-31 MED ORDER — METHYLPREDNISOLONE 4 MG PO TBPK
ORAL_TABLET | ORAL | 0 refills | Status: DC
  Filled 2023-01-31: qty 21, 6d supply, fill #0

## 2023-01-31 MED ORDER — METHYLPREDNISOLONE 4 MG PO TBPK: ORAL_TABLET | ORAL | 0 refills | Status: DC

## 2023-01-31 MED ORDER — TRAMADOL HCL 50 MG PO TABS: 50.0000 mg | ORAL_TABLET | Freq: Four times a day (QID) | ORAL | 2 refills | Status: DC | PRN

## 2023-01-31 NOTE — Telephone Encounter (Signed)
Repear message

## 2023-01-31 NOTE — Progress Notes (Signed)
Chief Complaint: Left hip pain radiating leg pain     History of Present Illness:   01/31/2023: Presents today for follow-up of her left radiating hip pain.  Her left knee is starting to bother her as well as her contralateral leg.  She is here today for further discussion.  She did get 1 week of extremely good relief from her injection but this is worn off.  [name] is a 62 y.o. female presents today for her left hip and leg pain.  She states that for the last 3 years this is been radiating around from the back into the side of the hip and down the knee.  She did previously have epidural injections the second of which did not provide any relief.  She was seeing Rexene Edison prior who performed an MRI of her lower back.  She is quite limited in most activities of daily living due to this.  She is here today for further assessment    Surgical History:   none  PMH/PSH/Family History/Social History/Meds/Allergies:    Past Medical History:  Diagnosis Date   Arthritis    rheumatoid per pt   Past Surgical History:  Procedure Laterality Date   ABDOMINAL HYSTERECTOMY  2001   TAH/BSO fibroids   CATARACT EXTRACTION Right    COLONOSCOPY  04/27/2011   obstructed bile duct  2004   Dr Micki Riley   OOPHORECTOMY     BSO   REVERSE SHOULDER ARTHROPLASTY Left 02/14/2022   Procedure: LEFT REVERSE SHOULDER ARTHROPLASTY;  Surgeon: Huel Cote, MD;  Location: [facility] OR;  Service: Orthopedics;  Laterality: Left;   ROTATOR CUFF REPAIR Bilateral 12/2010   Left and right   Social History   Socioeconomic History   Marital status: Single    Spouse name: Not on file   Number of children: 1   Years of education: Not on file   Highest education level: Not on file  Occupational History   Occupation: MEAT CUTTER    Employer: [employer]  Tobacco Use   Smoking status: Never   Smokeless tobacco: Never  Vaping Use   Vaping Use: Never used  Substance and Sexual  Activity   Alcohol use: Yes    Alcohol/week: 0.0 standard drinks of alcohol    Comment: Occas. wine   Drug use: No   Sexual activity: Not Currently    Partners: Male    Birth control/protection: Surgical    Comment: 1st intercourse 62 yo-More than 5 partners,hysterectomy  Other Topics Concern   Not on file  Social History Narrative   Exercise-- water aerobics   Social Determinants of Health   Financial Resource Strain: Not on file  Food Insecurity: Not on file  Transportation Needs: Not on file  Physical Activity: Not on file  Stress: Not on file  Social Connections: Not on file   Family History  Problem Relation Age of Onset   Heart disease Mother 72       MI   Stroke Mother    Hypertension Mother    Arthritis Mother    Pulmonary embolism Mother    Prostate cancer Father    Hypertension Father    Arthritis Sister    Colon cancer Neg Hx    Colon polyps Neg Hx    Esophageal cancer Neg Hx  Rectal cancer Neg Hx    Stomach cancer Neg Hx    Breast cancer Neg Hx    No Known Allergies Current Outpatient Medications  Medication Sig Dispense Refill   methylPREDNISolone (MEDROL DOSEPAK) 4 MG TBPK tablet Take per packet instructions 21 tablet 0   traMADol (ULTRAM) 50 MG tablet Take 1 tablet (50 mg total) by mouth every 6 (six) hours as needed. 30 tablet 2   Vitamin D, Ergocalciferol, (DRISDOL) 1.25 MG (50000 UNIT) CAPS capsule Take 1 capsule (50,000 Units total) by mouth every 7 (seven) days. 12 capsule 1   Cholecalciferol (VITAMIN D PO) Take 1 tablet by mouth 2 (two) times a week.     cyclobenzaprine (FLEXERIL) 5 MG tablet Take 1 tablet (5 mg total) by mouth at bedtime. (Patient taking differently: Take 5 mg by mouth daily as needed for muscle spasms.) 30 tablet 0   LORazepam (ATIVAN) 1 MG tablet Take 1 tablet (1 mg total) by mouth every 8 (eight) hours. 2 tablet 0   promethazine-dextromethorphan (PROMETHAZINE-DM) 6.25-15 MG/5ML syrup Take 5 mLs by mouth 4 (four) times daily  as needed. (Patient not taking: Reported on 01/18/2023) 118 mL 0   No current facility-administered medications for this visit.   No results found.  Review of Systems:   A ROS was performed including pertinent positives and negatives as documented in the HPI.  Physical Exam :   Constitutional: NAD and appears stated age Neurological: Alert and oriented Psych: Appropriate affect and cooperative There were no vitals taken for this visit.   Comprehensive Musculoskeletal Exam:    Inspection Right Left  Skin No atrophy or gross abnormalities appreciated No atrophy or gross abnormalities appreciated  Palpation    Tenderness None None  Crepitus None None  Range of Motion    Flexion (passive) 120 120  Extension 30 30  IR 30 30 no pain  ER 40 45 with pain lateral trochanter  Strength    Flexion  5/5 5/5  Extension 5/5 5/5  Special Tests    FABER Negative Negative  FADIR Negative Negative  ER Lag/Capsular Insufficiency Negative Negative  Instability Negative Negative  Sacroiliac pain Negative  Negative   Instability    Generalized Laxity No No  Neurologic    sciatic, femoral, obturator nerves intact to light sensation  Vascular/Lymphatic    DP pulse 2+ 2+  Lumbar Exam    Patient has symmetric lumbar range of motion with negative pain referral to hip     Imaging:   Xray (2 views left hip, 4 views left knee): Mild degenerative findings about the femoral acetabular joint, normal left knee  MRI (lumbar spine): There are some degenerative changes without any significant evidence of neural compression  I personally reviewed and interpreted the radiographs.   Assessment:   62 y.o. female with left hip pain radiating around the side.  I did describe that overall I do believe this is consistent with a gluteus medius tear.  She did get 1 week of very good relief from her injection.  Her MRI lumbar spine is normal.  At this time I would like to obtain an MRI of the left hip given  the fact that she has failed physical therapy.  I do believe that the majority of her knee pain is likely stemming from her hip pain and weakness.  Will plan to proceed with an MRI left hip and follow-up to discuss results  Plan :    -MRI left hip and follow-up to discuss  results     I personally saw and evaluated the patient, and participated in the management and treatment plan.  Huel Cote, MD Attending Physician, Orthopedic Surgery  This document was dictated using Dragon voice recognition software. A reasonable attempt at proof reading has been made to minimize errors.

## 2023-01-31 NOTE — Telephone Encounter (Signed)
Patient asking about the medication that was supposed to be called in this am. Please advise

## 2023-01-31 NOTE — Telephone Encounter (Signed)
Patient stats the medication need to be called into [facility] on [address]

## 2023-01-31 NOTE — Addendum Note (Signed)
Addended by: Benancio Deeds on: 01/31/2023 03:36 PM   Modules accepted: Orders

## 2023-02-06 ENCOUNTER — Encounter (HOSPITAL_BASED_OUTPATIENT_CLINIC_OR_DEPARTMENT_OTHER): Payer: Self-pay | Admitting: Orthopaedic Surgery

## 2023-02-08 ENCOUNTER — Ambulatory Visit
Admission: RE | Admit: 2023-02-08 | Discharge: 2023-02-08 | Disposition: A | Discharge status: Home / Self Care | Payer: 59 | Source: Ambulatory Visit | Attending: Orthopaedic Surgery | Admitting: Orthopaedic Surgery

## 2023-02-08 DIAGNOSIS — M1612 Unilateral primary osteoarthritis, left hip: Secondary | ICD-10-CM | POA: Diagnosis not present

## 2023-02-08 DIAGNOSIS — Z96642 Presence of left artificial hip joint: Secondary | ICD-10-CM

## 2023-02-08 DIAGNOSIS — M67854 Other specified disorders of tendon, left hip: Secondary | ICD-10-CM | POA: Diagnosis not present

## 2023-02-08 DIAGNOSIS — M25552 Pain in left hip: Secondary | ICD-10-CM | POA: Diagnosis not present

## 2023-02-28 ENCOUNTER — Ambulatory Visit (HOSPITAL_BASED_OUTPATIENT_CLINIC_OR_DEPARTMENT_OTHER): Payer: 59 | Admitting: Orthopaedic Surgery

## 2023-02-28 ENCOUNTER — Other Ambulatory Visit (HOSPITAL_BASED_OUTPATIENT_CLINIC_OR_DEPARTMENT_OTHER): Payer: Self-pay

## 2023-02-28 ENCOUNTER — Ambulatory Visit (HOSPITAL_BASED_OUTPATIENT_CLINIC_OR_DEPARTMENT_OTHER): Payer: Self-pay | Admitting: Orthopaedic Surgery

## 2023-02-28 DIAGNOSIS — M67959 Unspecified disorder of synovium and tendon, unspecified thigh: Secondary | ICD-10-CM | POA: Diagnosis not present

## 2023-02-28 MED ORDER — ACETAMINOPHEN 500 MG PO TABS
500.0000 mg | ORAL_TABLET | Freq: Three times a day (TID) | ORAL | 0 refills | Status: AC
  Filled 2023-02-28: qty 30, 10d supply, fill #0

## 2023-02-28 MED ORDER — ASPIRIN 325 MG PO TBEC
325.0000 mg | DELAYED_RELEASE_TABLET | Freq: Every day | ORAL | 0 refills | Status: AC
  Filled 2023-02-28: qty 30, 30d supply, fill #0

## 2023-02-28 MED ORDER — OXYCODONE HCL 5 MG PO TABS
5.0000 mg | ORAL_TABLET | ORAL | 0 refills | Status: DC | PRN
  Filled 2023-02-28: qty 15, 3d supply, fill #0

## 2023-02-28 MED ORDER — IBUPROFEN 800 MG PO TABS
800.0000 mg | ORAL_TABLET | Freq: Three times a day (TID) | ORAL | 0 refills | Status: AC
  Filled 2023-02-28: qty 30, 10d supply, fill #0

## 2023-02-28 NOTE — Progress Notes (Signed)
Chief Complaint: Left hip pain radiating leg pain     History of Present Illness:   01/31/2023: Presents today for follow-up of her left radiating hip pain as well as her MRI.  She is still persistently having pain laying directly on the side.   [name] is a 62 y.o. female presents today for her left hip and leg pain.  She states that for the last 3 years this is been radiating around from the back into the side of the hip and down the knee.  She did previously have epidural injections the second of which did not provide any relief.  She was seeing Rexene Edison prior who performed an MRI of her lower back.  She is quite limited in most activities of daily living due to this.  She is here today for further assessment    Surgical History:   none  PMH/PSH/Family History/Social History/Meds/Allergies:    Past Medical History:  Diagnosis Date   Arthritis    rheumatoid per pt   Past Surgical History:  Procedure Laterality Date   ABDOMINAL HYSTERECTOMY  2001   TAH/BSO fibroids   CATARACT EXTRACTION Right    COLONOSCOPY  04/27/2011   obstructed bile duct  2004   Dr Micki Riley   OOPHORECTOMY     BSO   REVERSE SHOULDER ARTHROPLASTY Left 02/14/2022   Procedure: LEFT REVERSE SHOULDER ARTHROPLASTY;  Surgeon: Huel Cote, MD;  Location: [facility] OR;  Service: Orthopedics;  Laterality: Left;   ROTATOR CUFF REPAIR Bilateral 12/2010   Left and right   Social History   Socioeconomic History   Marital status: Single    Spouse name: Not on file   Number of children: 1   Years of education: Not on file   Highest education level: Not on file  Occupational History   Occupation: MEAT CUTTER    Employer: [employer]  Tobacco Use   Smoking status: Never   Smokeless tobacco: Never  Vaping Use   Vaping Use: Never used  Substance and Sexual Activity   Alcohol use: Yes    Alcohol/week: 0.0 standard drinks of alcohol    Comment: Occas. wine   Drug use:  No   Sexual activity: Not Currently    Partners: Male    Birth control/protection: Surgical    Comment: 1st intercourse 62 yo-More than 5 partners,hysterectomy  Other Topics Concern   Not on file  Social History Narrative   Exercise-- water aerobics   Social Determinants of Health   Financial Resource Strain: Not on file  Food Insecurity: Not on file  Transportation Needs: Not on file  Physical Activity: Not on file  Stress: Not on file  Social Connections: Not on file   Family History  Problem Relation Age of Onset   Heart disease Mother 102       MI   Stroke Mother    Hypertension Mother    Arthritis Mother    Pulmonary embolism Mother    Prostate cancer Father    Hypertension Father    Arthritis Sister    Colon cancer Neg Hx    Colon polyps Neg Hx    Esophageal cancer Neg Hx    Rectal cancer Neg Hx    Stomach cancer Neg Hx    Breast cancer Neg Hx  No Known Allergies Current Outpatient Medications  Medication Sig Dispense Refill   methylPREDNISolone (MEDROL DOSEPAK) 4 MG TBPK tablet Take per packet instructions 21 tablet 0   traMADol (ULTRAM) 50 MG tablet Take 1 tablet (50 mg total) by mouth every 6 (six) hours as needed. 30 tablet 2   Vitamin D, Ergocalciferol, (DRISDOL) 1.25 MG (50000 UNIT) CAPS capsule Take 1 capsule (50,000 Units total) by mouth every 7 (seven) days. 12 capsule 1   Cholecalciferol (VITAMIN D PO) Take 1 tablet by mouth 2 (two) times a week.     cyclobenzaprine (FLEXERIL) 5 MG tablet Take 1 tablet (5 mg total) by mouth at bedtime. (Patient taking differently: Take 5 mg by mouth daily as needed for muscle spasms.) 30 tablet 0   LORazepam (ATIVAN) 1 MG tablet Take 1 tablet (1 mg total) by mouth every 8 (eight) hours. 2 tablet 0   promethazine-dextromethorphan (PROMETHAZINE-DM) 6.25-15 MG/5ML syrup Take 5 mLs by mouth 4 (four) times daily as needed. (Patient not taking: Reported on 01/18/2023) 118 mL 0   No current facility-administered medications  for this visit.   No results found.  Review of Systems:   A ROS was performed including pertinent positives and negatives as documented in the HPI.  Physical Exam :   Constitutional: NAD and appears stated age Neurological: Alert and oriented Psych: Appropriate affect and cooperative There were no vitals taken for this visit.   Comprehensive Musculoskeletal Exam:    Inspection Right Left  Skin No atrophy or gross abnormalities appreciated No atrophy or gross abnormalities appreciated  Palpation    Tenderness None None  Crepitus None None  Range of Motion    Flexion (passive) 120 120  Extension 30 30  IR 30 30 no pain  ER 40 45 with pain lateral trochanter  Strength    Flexion  5/5 5/5  Extension 5/5 5/5  Special Tests    FABER Negative Negative  FADIR Negative Negative  ER Lag/Capsular Insufficiency Negative Negative  Instability Negative Negative  Sacroiliac pain Negative  Negative   Instability    Generalized Laxity No No  Neurologic    sciatic, femoral, obturator nerves intact to light sensation  Vascular/Lymphatic    DP pulse 2+ 2+  Lumbar Exam    Patient has symmetric lumbar range of motion with negative pain referral to hip     Imaging:   Xray (2 views left hip, 4 views left knee): Mild degenerative findings about the femoral acetabular joint, normal left knee  MRI (lumbar spine): There are some degenerative changes without any significant evidence of neural compression  MRI left hip: Near full-thickness gluteus medius tearing at the junction of the minimus and medius  I personally reviewed and interpreted the radiographs.   Assessment:   62 y.o. female with left hip pain radiating around the side which is consistent with gluteus medius tear.  At this time she will try physical therapy as well as an injection.  She did get initially very good relief from her injection although this is worn off.  We did discuss additional treatment options.  Given the  fact that she is improving with physical therapy and is still having a difficult time laying directly on the side we did discuss the possibility of left hip gluteus medius repair.  I did discuss limitations restrictions associated with this.  After long discussion she has elected for this.  Plan :    -Plan for left hip gluteus medius repair with possible collagen patch  augmentation   After a lengthy discussion of treatment options, including risks, benefits, alternatives, complications of surgical and nonsurgical conservative options, the patient elected surgical repair.   The patient  is aware of the material risks  and complications including, but not limited to injury to adjacent structures, neurovascular injury, infection, numbness, bleeding, implant failure, thermal burns, stiffness, persistent pain, failure to heal, disease transmission from allograft, need for further surgery, dislocation, anesthetic risks, blood clots, risks of death,and others. The probabilities of surgical success and failure discussed with patient given their particular co-morbidities.The time and nature of expected rehabilitation and recovery was discussed.The patient's questions were all answered preoperatively.  No barriers to understanding were noted. I explained the natural history of the disease process and Rx rationale.  I explained to the patient what I considered to be reasonable expectations given their personal situation.  The final treatment plan was arrived at through a shared patient decision making process model.      I personally saw and evaluated the patient, and participated in the management and treatment plan.  Huel Cote, MD Attending Physician, Orthopedic Surgery  This document was dictated using Dragon voice recognition software. A reasonable attempt at proof reading has been made to minimize errors.

## 2023-03-19 NOTE — Progress Notes (Unsigned)
[name] is a 62 y.o. female presents to the office today for Shingrix #2 injection, per physician's orders. Original order: 01/18/23 Shingrix 0.5 mL IM was administered R Deltoid today. Patient tolerated injection. Patient due for follow up labs/provider appt: No.  Patient next injection due: n/a    Creft, Melton Alar L

## 2023-03-20 ENCOUNTER — Ambulatory Visit (INDEPENDENT_AMBULATORY_CARE_PROVIDER_SITE_OTHER): Payer: 59

## 2023-03-20 DIAGNOSIS — Z23 Encounter for immunization: Secondary | ICD-10-CM | POA: Diagnosis not present

## 2023-03-29 ENCOUNTER — Telehealth: Payer: Self-pay | Admitting: Family Medicine

## 2023-03-29 NOTE — Telephone Encounter (Signed)
April from [facility] stated they have faxed over surgical clearance forms 7.1 and 7.17 and has not heard anything back. Confirmed fax number. She stated pt is getting "antsy" trying to sched the surgery. She stated to call her if there is an issue, [phone]

## 2023-03-29 NOTE — Telephone Encounter (Signed)
Pt needs an office visit for surgical clearance

## 2023-04-02 ENCOUNTER — Telehealth: Payer: Self-pay

## 2023-04-02 NOTE — Telephone Encounter (Signed)
Pt called and scheduled

## 2023-04-02 NOTE — Telephone Encounter (Signed)
Received pre-op consult for left gluteus medius repair. Pt last seen on 01/18/23 for CPE. Would you like a pre-op visit?

## 2023-04-02 NOTE — Telephone Encounter (Signed)
Lvm2 sched  

## 2023-04-06 ENCOUNTER — Ambulatory Visit (INDEPENDENT_AMBULATORY_CARE_PROVIDER_SITE_OTHER): Payer: 59 | Admitting: Family Medicine

## 2023-04-06 ENCOUNTER — Encounter: Payer: Self-pay | Admitting: Family Medicine

## 2023-04-06 VITALS — BP 110/90 | HR 65 | Temp 98.3°F | Resp 18 | Ht 65.0 in | Wt 200.2 lb

## 2023-04-06 DIAGNOSIS — J029 Acute pharyngitis, unspecified: Secondary | ICD-10-CM | POA: Diagnosis not present

## 2023-04-06 DIAGNOSIS — J069 Acute upper respiratory infection, unspecified: Secondary | ICD-10-CM | POA: Diagnosis not present

## 2023-04-06 DIAGNOSIS — Z01818 Encounter for other preprocedural examination: Secondary | ICD-10-CM

## 2023-04-06 LAB — POC COVID19 BINAXNOW: SARS Coronavirus 2 Ag: NEGATIVE

## 2023-04-06 LAB — CBC WITH DIFFERENTIAL/PLATELET
Basophils Absolute: 0 10*3/uL (ref 0.0–0.1)
Basophils Relative: 0.3 % (ref 0.0–3.0)
Eosinophils Absolute: 0.1 10*3/uL (ref 0.0–0.7)
Eosinophils Relative: 1.2 % (ref 0.0–5.0)
HCT: 37.7 % (ref 36.0–46.0)
Hemoglobin: 12 g/dL (ref 12.0–15.0)
Lymphocytes Relative: 46.8 % — ABNORMAL HIGH (ref 12.0–46.0)
Lymphs Abs: 1.9 10*3/uL (ref 0.7–4.0)
MCHC: 31.8 g/dL (ref 30.0–36.0)
MCV: 88.3 fl (ref 78.0–100.0)
Monocytes Absolute: 0.3 10*3/uL (ref 0.1–1.0)
Monocytes Relative: 7.8 % (ref 3.0–12.0)
Neutro Abs: 1.8 10*3/uL (ref 1.4–7.7)
Neutrophils Relative %: 43.9 % (ref 43.0–77.0)
Platelets: 212 10*3/uL (ref 150.0–400.0)
RBC: 4.27 Mil/uL (ref 3.87–5.11)
RDW: 14.2 % (ref 11.5–15.5)
WBC: 4.1 10*3/uL (ref 4.0–10.5)

## 2023-04-06 LAB — LIPID PANEL
Cholesterol: 238 mg/dL — ABNORMAL HIGH (ref 0–200)
HDL: 56.3 mg/dL (ref >39.00)
LDL Cholesterol: 148 mg/dL — ABNORMAL HIGH (ref 0–99)
NonHDL: 181.43
Total CHOL/HDL Ratio: 4
Triglycerides: 169 mg/dL — ABNORMAL HIGH (ref 0.0–149.0)
VLDL: 33.8 mg/dL (ref 0.0–40.0)

## 2023-04-06 LAB — COMPREHENSIVE METABOLIC PANEL
ALT: 12 U/L (ref 0–35)
AST: 20 U/L (ref 0–37)
Albumin: 4.2 g/dL (ref 3.5–5.2)
Alkaline Phosphatase: 100 U/L (ref 39–117)
BUN: 10 mg/dL (ref 6–23)
CO2: 28 mEq/L (ref 19–32)
Calcium: 9 mg/dL (ref 8.4–10.5)
Chloride: 105 mEq/L (ref 96–112)
Creatinine, Ser: 0.71 mg/dL (ref 0.40–1.20)
GFR: 90.65 mL/min (ref >60.00)
Glucose, Bld: 96 mg/dL (ref 70–99)
Potassium: 3.9 mEq/L (ref 3.5–5.1)
Sodium: 140 mEq/L (ref 135–145)
Total Bilirubin: 0.6 mg/dL (ref 0.2–1.2)
Total Protein: 6.9 g/dL (ref 6.0–8.3)

## 2023-04-06 LAB — PROTIME-INR
INR: 1.1 ratio — ABNORMAL HIGH (ref 0.8–1.0)
Prothrombin Time: 12.1 s (ref 9.6–13.1)

## 2023-04-06 LAB — TSH: TSH: 0.9 u[IU]/mL (ref 0.35–5.50)

## 2023-04-06 LAB — POCT RAPID STREP A (OFFICE): Rapid Strep A Screen: NEGATIVE

## 2023-04-06 LAB — APTT: aPTT: 32.2 s (ref 25.4–36.8)

## 2023-04-06 MED ORDER — AZELASTINE HCL 0.1 % NA SOLN: 1.0000 | Freq: Two times a day (BID) | NASAL | 12 refills | Status: DC

## 2023-04-06 MED ORDER — LEVOCETIRIZINE DIHYDROCHLORIDE 5 MG PO TABS: 5.0000 mg | ORAL_TABLET | Freq: Every evening | ORAL | 5 refills | Status: AC

## 2023-04-06 NOTE — Assessment & Plan Note (Signed)
EKG --- sinus rhythm Cleared for surgery

## 2023-04-06 NOTE — Assessment & Plan Note (Signed)
Antihistamine daily  Flonase  Symptoms have almost completely resolved Covid and strep negative

## 2023-04-06 NOTE — Progress Notes (Signed)
Established Patient Office Visit  Subjective   Patient ID: [name], female    DOB: 1961/05/24  Age: 62 y.o. MRN: [id number]  Chief Complaint  Patient presents with   Sore Throat    Pt states having sore throat, vomiting, fever, no pain with swallowing. No COVID test. Pt states taking Mucinex. Sxs started Sunday    HPI Discussed the use of AI scribe software for clinical note transcription with the patient, who gave verbal consent to proceed.  History of Present Illness   The patient, with a history of pelvic pain, presents after a recent trip to [place] with symptoms of fever, headache, and mucus production. She initially attributed these symptoms to the heat and has been managing them with Mucinex. The patient reports feeling much better at the time of the visit. She is also scheduled for a surgery on her pelvis due to persistent pain, which is to be performed by Dr. Darylene Price. The patient also mentions planning a surprise birthday party for her sister.       Patient Active Problem List   Diagnosis Date Noted   Upper respiratory tract infection 04/06/2023   Sore throat 04/06/2023   COVID-19 09/28/2022   Right calf pain 09/28/2022   Acute cough 09/28/2022   Rotator cuff arthropathy of left shoulder    Preop examination 12/30/2021   Arthritis of carpometacarpal [facility]) joint of left thumb 12/02/2021   Lumbar radiculopathy 05/29/2017   Greater trochanteric bursitis of left hip 05/29/2017   Contusion, hip and thigh, left, initial encounter 05/29/2017   Left hip pain 04/30/2017   Abdominal pain, LLQ 04/30/2017   Back pain 02/16/2015   Obesity (BMI 30-39.9) 06/09/2013   OSTEOARTHRITIS 12/15/2009   IMPAIRMENT, ONE EYE MODERATE, OTHER NOS 07/01/2007   SBO 07/01/2007   HERNIA, UMBILICAL 06/11/2007   Past Medical History:  Diagnosis Date   Arthritis    rheumatoid per pt   Past Surgical  History:  Procedure Laterality Date   ABDOMINAL HYSTERECTOMY  2001   TAH/BSO fibroids   CATARACT EXTRACTION Right    COLONOSCOPY  04/27/2011   obstructed bile duct  2004   Dr Micki Riley   OOPHORECTOMY     BSO   REVERSE SHOULDER ARTHROPLASTY Left 02/14/2022   Procedure: LEFT REVERSE SHOULDER ARTHROPLASTY;  Surgeon: Huel Cote, MD;  Location: [facility] OR;  Service: Orthopedics;  Laterality: Left;   ROTATOR CUFF REPAIR Bilateral 12/2010   Left and right   Social History   Tobacco Use   Smoking status: Never   Smokeless tobacco: Never  Vaping Use   Vaping status: Never Used  Substance Use Topics   Alcohol use: Yes    Alcohol/week: 0.0 standard drinks of alcohol    Comment: Occas. wine   Drug use: No   Social History   Socioeconomic History   Marital status: Single    Spouse name: Not on file   Number of children: 1   Years of education: Not on file   Highest education level: Not on file  Occupational History   Occupation: MEAT CUTTER    Employer: [employer]  Tobacco Use   Smoking status: Never   Smokeless tobacco: Never  Vaping Use   Vaping status: Never Used  Substance and Sexual Activity   Alcohol use: Yes    Alcohol/week: 0.0 standard drinks of alcohol    Comment: Occas. wine   Drug use: No   Sexual activity: Not Currently    Partners: Male    Birth control/protection: Surgical    Comment: 1st intercourse 62 yo-More than 5 partners,hysterectomy  Other Topics Concern   Not on file  Social History Narrative   Exercise-- water aerobics   Social Determinants of Health   Financial Resource Strain: Not on file  Food Insecurity: Not on file  Transportation Needs: Not on file  Physical Activity: Not on file  Stress: Not on file  Social Connections: Not on file  Intimate Partner Violence: Not on file   Family Status  Relation Name Status   Mother  Deceased at age 26       PE   Father  Deceased   Sister  Alive   Brother  Alive   Brother  Alive   Neg Hx   (Not Specified)  No partnership data on file   Family History  Problem Relation Age of Onset   Heart disease Mother 49       MI   Stroke Mother    Hypertension Mother    Arthritis Mother    Pulmonary embolism Mother    Prostate cancer Father    Hypertension Father    Arthritis Sister    Colon cancer Neg Hx    Colon polyps Neg Hx    Esophageal cancer Neg Hx    Rectal cancer Neg Hx    Stomach cancer Neg Hx    Breast cancer Neg Hx    No Known Allergies    Review of Systems  Constitutional:  Negative for fever and malaise/fatigue.  HENT:  Negative for congestion.   Eyes:  Negative for blurred vision.  Respiratory:  Negative for cough and shortness of breath.   Cardiovascular:  Negative for chest pain, palpitations and leg swelling.  Gastrointestinal:  Negative for abdominal pain, blood in stool and nausea.  Genitourinary:  Negative for dysuria and frequency.  Musculoskeletal:  Negative for falls.  Skin:  Negative for rash.  Neurological:  Negative for dizziness, loss of consciousness and headaches.  Endo/Heme/Allergies:  Negative for environmental allergies.  Psychiatric/Behavioral:  Negative for depression. The patient is not nervous/anxious.       Objective:     BP (!) 110/90 (BP Location: Left Arm, Patient Position: Sitting, Cuff Size: Large)   Pulse 65   Temp 98.3 F (36.8 C) (Oral)   Resp 18   Ht 5\' 5"  (1.651 m)   Wt 200 lb 3.2 oz (90.8 kg)   SpO2 97%   BMI 33.32 kg/m  BP Readings from Last 3 Encounters:  04/06/23 (!) 110/90  01/18/23 108/88  10/02/22 (!) 151/88   Wt Readings from Last 3 Encounters:  04/06/23 200 lb 3.2 oz (90.8 kg)  01/18/23 200 lb 9.6 oz (91 kg)  10/02/22 192 lb (87.1 kg)   SpO2 Readings from Last 3 Encounters:  04/06/23 97%  10/02/22 94%  09/28/22 98%      Physical Exam Vitals and nursing note reviewed.  Constitutional:      General: She is not in acute distress.    Appearance: Normal appearance. She is well-developed.   HENT:     Head: Normocephalic and atraumatic.     Right Ear: Tympanic membrane, ear canal and external ear normal. There is no impacted cerumen.     Left  Ear: Tympanic membrane, ear canal and external ear normal. There is no impacted cerumen.     Nose: Nose normal.     Mouth/Throat:     Mouth: Mucous membranes are moist.     Pharynx: Oropharynx is clear. No oropharyngeal exudate or posterior oropharyngeal erythema.  Eyes:     General: No scleral icterus.       Right eye: No discharge.        Left eye: No discharge.     Conjunctiva/sclera: Conjunctivae normal.     Pupils: Pupils are equal, round, and reactive to light.  Neck:     Thyroid: No thyromegaly or thyroid tenderness.     Vascular: No JVD.  Cardiovascular:     Rate and Rhythm: Normal rate and regular rhythm.     Heart sounds: Normal heart sounds. No murmur heard. Pulmonary:     Effort: Pulmonary effort is normal. No respiratory distress.     Breath sounds: Normal breath sounds.  Abdominal:     General: Bowel sounds are normal. There is no distension.     Palpations: Abdomen is soft. There is no mass.     Tenderness: There is no abdominal tenderness. There is no guarding or rebound.  Genitourinary:    Vagina: Normal.  Musculoskeletal:        General: Normal range of motion.     Cervical back: Normal range of motion and neck supple.     Right lower leg: No edema.     Left lower leg: No edema.  Lymphadenopathy:     Cervical: No cervical adenopathy.  Skin:    General: Skin is warm and dry.     Findings: No erythema or rash.  Neurological:     Mental Status: She is alert and oriented to person, place, and time.     Cranial Nerves: No cranial nerve deficit.     Deep Tendon Reflexes: Reflexes are normal and symmetric.  Psychiatric:        Mood and Affect: Mood normal.        Behavior: Behavior normal.        Thought Content: Thought content normal.        Judgment: Judgment normal.      Results for orders placed  or performed in visit on 04/06/23  POC COVID-19  Result Value Ref Range   SARS Coronavirus 2 Ag Negative Negative  POCT rapid strep A  Result Value Ref Range   Rapid Strep A Screen Negative Negative     Last CBC Lab Results  Component Value Date   WBC 5.2 01/18/2023   HGB 11.4 (L) 01/18/2023   HCT 35.0 (L) 01/18/2023   MCV 90.9 01/18/2023   MCH 28.4 10/02/2022   RDW 13.6 01/18/2023   PLT 231.0 01/18/2023   Last metabolic panel Lab Results  Component Value Date   GLUCOSE 95 01/18/2023   NA 140 01/18/2023   K 3.9 01/18/2023   CL 104 01/18/2023   CO2 27 01/18/2023   BUN 13 01/18/2023   CREATININE 0.79 01/18/2023   GFR 79.87 01/18/2023   CALCIUM 9.2 01/18/2023   PROT 7.0 01/18/2023   ALBUMIN 4.3 01/18/2023   BILITOT 0.4 01/18/2023   ALKPHOS 95 01/18/2023   AST 22 01/18/2023   ALT 14 01/18/2023   ANIONGAP 14 10/02/2022   Last lipids Lab Results  Component Value Date   CHOL 269 (H) 01/18/2023   HDL 69.10 01/18/2023   LDLCALC 178 (H) 01/18/2023   LDLDIRECT 136.6  09/19/2012   TRIG 110.0 01/18/2023   CHOLHDL 4 01/18/2023   Last hemoglobin A1c No results found for: "HGBA1C" Last thyroid functions Lab Results  Component Value Date   TSH 1.36 01/18/2023   Last vitamin D Lab Results  Component Value Date   VD25OH 9.78 (L) 01/18/2023   Last vitamin B12 and Folate Lab Results  Component Value Date   VITAMINB12 288 06/09/2013      The 10-year ASCVD risk score (Arnett DK, et al., 2019) is: 5.5%    Assessment & Plan:   Problem List Items Addressed This Visit       Unprioritized   Upper respiratory tract infection    Antihistamine daily  Flonase  Symptoms have almost completely resolved Covid and strep negative       Relevant Medications   levocetirizine (XYZAL) 5 MG tablet   azelastine (ASTELIN) 0.1 % nasal spray   Sore throat    Strep neg Symptoms almost completely resolved       Relevant Orders   POC COVID-19 (Completed)   POCT rapid  strep A (Completed)   Preop examination - Primary    EKG --- sinus rhythm Cleared for surgery       Relevant Orders   EKG 12-Lead (Completed)   CBC with Differential/Platelet   Comprehensive metabolic panel   Lipid panel   TSH   Protime-INR ( [facility] ONLY)   PTT    No follow-ups on file.    Donato Schultz, DO

## 2023-04-06 NOTE — Assessment & Plan Note (Signed)
Strep neg Symptoms almost completely resolved

## 2023-04-11 NOTE — Telephone Encounter (Signed)
April with [facility] called to advise that pt had her pre op appt Friday but they have not received the form clearing her. Pt is anxious to get scheduled for surgery. April is requesting surgery clearance form to be faxed to their office.

## 2023-04-12 NOTE — Telephone Encounter (Signed)
LVM to have new surgical clearance sent over

## 2023-04-12 NOTE — Telephone Encounter (Signed)
Clearance and EKG faxed to [facility]

## 2023-04-13 ENCOUNTER — Other Ambulatory Visit (HOSPITAL_BASED_OUTPATIENT_CLINIC_OR_DEPARTMENT_OTHER): Payer: Self-pay | Admitting: Orthopaedic Surgery

## 2023-04-13 DIAGNOSIS — M67959 Unspecified disorder of synovium and tendon, unspecified thigh: Secondary | ICD-10-CM

## 2023-04-26 ENCOUNTER — Ambulatory Visit: Payer: 59 | Admitting: Gastroenterology

## 2023-04-26 DIAGNOSIS — H52203 Unspecified astigmatism, bilateral: Secondary | ICD-10-CM | POA: Diagnosis not present

## 2023-04-26 DIAGNOSIS — Z961 Presence of intraocular lens: Secondary | ICD-10-CM | POA: Diagnosis not present

## 2023-04-26 DIAGNOSIS — H524 Presbyopia: Secondary | ICD-10-CM | POA: Diagnosis not present

## 2023-04-26 DIAGNOSIS — H40011 Open angle with borderline findings, low risk, right eye: Secondary | ICD-10-CM | POA: Diagnosis not present

## 2023-04-27 ENCOUNTER — Encounter (HOSPITAL_BASED_OUTPATIENT_CLINIC_OR_DEPARTMENT_OTHER): Payer: Self-pay | Admitting: Orthopaedic Surgery

## 2023-04-27 ENCOUNTER — Other Ambulatory Visit: Payer: Self-pay

## 2023-04-30 NOTE — Progress Notes (Signed)

## 2023-05-07 ENCOUNTER — Encounter (HOSPITAL_BASED_OUTPATIENT_CLINIC_OR_DEPARTMENT_OTHER): Payer: Self-pay | Admitting: Orthopaedic Surgery

## 2023-05-07 NOTE — Anesthesia Preprocedure Evaluation (Signed)
Anesthesia Evaluation  Patient identified by MRN, date of birth, ID band Patient awake    Reviewed: Allergy & Precautions, NPO status , Patient's Chart, lab work & pertinent test results  Airway Mallampati: II       Dental no notable dental hx. (+) Teeth Intact, Dental Advisory Given   Pulmonary neg pulmonary ROS   Pulmonary exam normal breath sounds clear to auscultation       Cardiovascular negative cardio ROS Normal cardiovascular exam Rhythm:Regular Rate:Normal     Neuro/Psych Blind OS  Neuromuscular disease  negative psych ROS   GI/Hepatic negative GI ROS, Neg liver ROS,,,  Endo/Other  Obesity  Renal/GU negative Renal ROS  negative genitourinary   Musculoskeletal  (+) Arthritis , Rheumatoid disorders,  Left Gluteus medius tear Lumbar radiculopathy   Abdominal  (+) + obese  Peds  Hematology negative hematology ROS (+)   Anesthesia Other Findings   Reproductive/Obstetrics                              Anesthesia Physical Anesthesia Plan  ASA: 2  Anesthesia Plan: General   Post-op Pain Management: Precedex   Induction: Intravenous  PONV Risk Score and Plan: 4 or greater and Treatment may vary due to age or medical condition, Midazolam and Ondansetron  Airway Management Planned: Oral ETT  Additional Equipment: None  Intra-op Plan:   Post-operative Plan: Extubation in OR  Informed Consent: I have reviewed the patients History and Physical, chart, labs and discussed the procedure including the risks, benefits and alternatives for the proposed anesthesia with the patient or authorized representative who has indicated his/her understanding and acceptance.     Dental advisory given  Plan Discussed with: Anesthesiologist and CRNA  Anesthesia Plan Comments:          Anesthesia Quick Evaluation

## 2023-05-08 ENCOUNTER — Ambulatory Visit (HOSPITAL_BASED_OUTPATIENT_CLINIC_OR_DEPARTMENT_OTHER)
Admission: RE | Admit: 2023-05-08 | Discharge: 2023-05-08 | Disposition: A | Discharge status: Home / Self Care | Payer: 59 | Attending: Orthopaedic Surgery | Admitting: Orthopaedic Surgery

## 2023-05-08 ENCOUNTER — Encounter (HOSPITAL_BASED_OUTPATIENT_CLINIC_OR_DEPARTMENT_OTHER)
Admission: RE | Disposition: A | Discharge status: Home / Self Care | Payer: Self-pay | Source: Home / Self Care | Attending: Orthopaedic Surgery

## 2023-05-08 ENCOUNTER — Other Ambulatory Visit: Payer: Self-pay

## 2023-05-08 ENCOUNTER — Ambulatory Visit (HOSPITAL_BASED_OUTPATIENT_CLINIC_OR_DEPARTMENT_OTHER): Payer: 59 | Admitting: Anesthesiology

## 2023-05-08 ENCOUNTER — Encounter (HOSPITAL_BASED_OUTPATIENT_CLINIC_OR_DEPARTMENT_OTHER): Payer: Self-pay | Admitting: Orthopaedic Surgery

## 2023-05-08 DIAGNOSIS — Z6831 Body mass index (BMI) 31.0-31.9, adult: Secondary | ICD-10-CM | POA: Diagnosis not present

## 2023-05-08 DIAGNOSIS — M1990 Unspecified osteoarthritis, unspecified site: Secondary | ICD-10-CM | POA: Insufficient documentation

## 2023-05-08 DIAGNOSIS — M67959 Unspecified disorder of synovium and tendon, unspecified thigh: Secondary | ICD-10-CM

## 2023-05-08 DIAGNOSIS — X58XXXA Exposure to other specified factors, initial encounter: Secondary | ICD-10-CM | POA: Insufficient documentation

## 2023-05-08 DIAGNOSIS — M67952 Unspecified disorder of synovium and tendon, left thigh: Secondary | ICD-10-CM | POA: Diagnosis not present

## 2023-05-08 DIAGNOSIS — S76012A Strain of muscle, fascia and tendon of left hip, initial encounter: Secondary | ICD-10-CM | POA: Diagnosis not present

## 2023-05-08 DIAGNOSIS — M7602 Gluteal tendinitis, left hip: Secondary | ICD-10-CM | POA: Diagnosis not present

## 2023-05-08 DIAGNOSIS — E669 Obesity, unspecified: Secondary | ICD-10-CM | POA: Diagnosis not present

## 2023-05-08 DIAGNOSIS — M5416 Radiculopathy, lumbar region: Secondary | ICD-10-CM | POA: Diagnosis not present

## 2023-05-08 HISTORY — PX: GLUTEUS MINIMUS REPAIR: SHX5843

## 2023-05-08 SURGERY — REPAIR, TENDON, GLUTEUS MINIMUS
Anesthesia: General | Site: Hip | Laterality: Left

## 2023-05-08 MED ORDER — OXYCODONE HCL 5 MG PO TABS
5.0000 mg | ORAL_TABLET | Freq: Once | ORAL | Status: AC | PRN
  Administered 2023-05-08: 5 mg via ORAL

## 2023-05-08 MED ORDER — CEFAZOLIN SODIUM-DEXTROSE 2-4 GM/100ML-% IV SOLN
2.0000 g | INTRAVENOUS | Status: AC
  Administered 2023-05-08: 2 g via INTRAVENOUS

## 2023-05-08 MED ORDER — SUGAMMADEX SODIUM 200 MG/2ML IV SOLN
INTRAVENOUS | Status: DC | PRN
Start: 2023-05-08 — End: 2023-05-08
  Administered 2023-05-08: 200 mg via INTRAVENOUS

## 2023-05-08 MED ORDER — ONDANSETRON HCL 4 MG/2ML IJ SOLN
INTRAMUSCULAR | Status: DC | PRN
  Administered 2023-05-08: 4 mg via INTRAVENOUS

## 2023-05-08 MED ORDER — CEFAZOLIN SODIUM-DEXTROSE 2-4 GM/100ML-% IV SOLN
INTRAVENOUS | Status: AC
  Filled 2023-05-08: qty 100

## 2023-05-08 MED ORDER — ACETAMINOPHEN 500 MG PO TABS
1000.0000 mg | ORAL_TABLET | Freq: Once | ORAL | Status: AC
  Administered 2023-05-08: 1000 mg via ORAL

## 2023-05-08 MED ORDER — ROCURONIUM BROMIDE 100 MG/10ML IV SOLN
INTRAVENOUS | Status: DC | PRN
  Administered 2023-05-08: 70 mg via INTRAVENOUS

## 2023-05-08 MED ORDER — BUPIVACAINE HCL (PF) 0.5 % IJ SOLN
INTRAMUSCULAR | Status: DC | PRN
Start: 2023-05-08 — End: 2023-05-08
  Administered 2023-05-08: 20 mL

## 2023-05-08 MED ORDER — GABAPENTIN 300 MG PO CAPS
300.0000 mg | ORAL_CAPSULE | Freq: Once | ORAL | Status: AC
  Administered 2023-05-08: 300 mg via ORAL

## 2023-05-08 MED ORDER — DEXAMETHASONE SODIUM PHOSPHATE 4 MG/ML IJ SOLN
INTRAMUSCULAR | Status: DC | PRN
  Administered 2023-05-08: 10 mg via INTRAVENOUS

## 2023-05-08 MED ORDER — ONDANSETRON HCL 4 MG/2ML IJ SOLN: 4.0000 mg | Freq: Once | INTRAMUSCULAR | Status: DC | PRN

## 2023-05-08 MED ORDER — OXYCODONE HCL 5 MG PO TABS
ORAL_TABLET | ORAL | Status: AC
  Filled 2023-05-08: qty 1

## 2023-05-08 MED ORDER — BUPIVACAINE HCL (PF) 0.5 % IJ SOLN
INTRAMUSCULAR | Status: AC
  Filled 2023-05-08: qty 60

## 2023-05-08 MED ORDER — ONDANSETRON HCL 4 MG/2ML IJ SOLN
INTRAMUSCULAR | Status: AC
  Filled 2023-05-08: qty 2

## 2023-05-08 MED ORDER — OXYCODONE HCL 5 MG/5ML PO SOLN: 5.0000 mg | Freq: Once | ORAL | Status: AC | PRN

## 2023-05-08 MED ORDER — TRANEXAMIC ACID-NACL 1000-0.7 MG/100ML-% IV SOLN
1000.0000 mg | INTRAVENOUS | Status: AC
  Administered 2023-05-08: 1000 mg via INTRAVENOUS

## 2023-05-08 MED ORDER — ACETAMINOPHEN 500 MG PO TABS
ORAL_TABLET | ORAL | Status: AC
  Filled 2023-05-08: qty 2

## 2023-05-08 MED ORDER — FENTANYL CITRATE (PF) 100 MCG/2ML IJ SOLN
INTRAMUSCULAR | Status: DC | PRN
  Administered 2023-05-08: 100 ug via INTRAVENOUS

## 2023-05-08 MED ORDER — HYDROMORPHONE HCL 1 MG/ML IJ SOLN
0.2500 mg | INTRAMUSCULAR | Status: DC | PRN
  Administered 2023-05-08 (×3): 0.5 mg via INTRAVENOUS

## 2023-05-08 MED ORDER — AMISULPRIDE (ANTIEMETIC) 5 MG/2ML IV SOLN
10.0000 mg | Freq: Once | INTRAVENOUS | Status: AC
  Administered 2023-05-08: 10 mg via INTRAVENOUS

## 2023-05-08 MED ORDER — PROPOFOL 10 MG/ML IV BOLUS
INTRAVENOUS | Status: AC
  Filled 2023-05-08: qty 20

## 2023-05-08 MED ORDER — ROCURONIUM BROMIDE 10 MG/ML (PF) SYRINGE
PREFILLED_SYRINGE | INTRAVENOUS | Status: AC
  Filled 2023-05-08: qty 10

## 2023-05-08 MED ORDER — HYDROMORPHONE HCL 1 MG/ML IJ SOLN
INTRAMUSCULAR | Status: AC
  Filled 2023-05-08: qty 0.5

## 2023-05-08 MED ORDER — TRANEXAMIC ACID-NACL 1000-0.7 MG/100ML-% IV SOLN
INTRAVENOUS | Status: AC
  Filled 2023-05-08: qty 100

## 2023-05-08 MED ORDER — DROPERIDOL 2.5 MG/ML IJ SOLN: 0.6250 mg | Freq: Once | INTRAMUSCULAR | Status: DC | PRN

## 2023-05-08 MED ORDER — DEXMEDETOMIDINE HCL IN NACL 200 MCG/50ML IV SOLN
INTRAVENOUS | Status: DC | PRN
  Administered 2023-05-08: 12 ug via INTRAVENOUS

## 2023-05-08 MED ORDER — DEXAMETHASONE SODIUM PHOSPHATE 10 MG/ML IJ SOLN
INTRAMUSCULAR | Status: AC
  Filled 2023-05-08: qty 1

## 2023-05-08 MED ORDER — FENTANYL CITRATE (PF) 100 MCG/2ML IJ SOLN
INTRAMUSCULAR | Status: AC
  Filled 2023-05-08: qty 2

## 2023-05-08 MED ORDER — PROPOFOL 10 MG/ML IV BOLUS
INTRAVENOUS | Status: DC | PRN
  Administered 2023-05-08: 200 mg via INTRAVENOUS

## 2023-05-08 MED ORDER — GABAPENTIN 300 MG PO CAPS
ORAL_CAPSULE | ORAL | Status: AC
  Filled 2023-05-08: qty 1

## 2023-05-08 MED ORDER — LIDOCAINE HCL (CARDIAC) PF 100 MG/5ML IV SOSY
PREFILLED_SYRINGE | INTRAVENOUS | Status: DC | PRN
  Administered 2023-05-08: 80 mg via INTRAVENOUS

## 2023-05-08 MED ORDER — LIDOCAINE 2% (20 MG/ML) 5 ML SYRINGE
INTRAMUSCULAR | Status: AC
  Filled 2023-05-08: qty 5

## 2023-05-08 MED ORDER — LACTATED RINGERS IV SOLN: INTRAVENOUS | Status: DC

## 2023-05-08 MED ORDER — AMISULPRIDE (ANTIEMETIC) 5 MG/2ML IV SOLN
INTRAVENOUS | Status: AC
  Filled 2023-05-08: qty 4

## 2023-05-08 MED ORDER — 0.9 % SODIUM CHLORIDE (POUR BTL) OPTIME
TOPICAL | Status: DC | PRN
  Administered 2023-05-08: 60 mL

## 2023-05-08 SURGICAL SUPPLY — 56 items
ADH SKN CLS APL DERMABOND .7 (GAUZE/BANDAGES/DRESSINGS)
ANCH SUT KNTLS STRL SHLDR SYS (Anchor) ×2 IMPLANT
ANCHOR JUGGERKNOT SOFT 1.5 (Anchor) ×2 IMPLANT
ANCHOR JUGGERKNOT SOFT 2.9 (Anchor) IMPLANT
ANCHOR SUT QUATTRO KNTLS 4.5 (Anchor) IMPLANT
APL PRP STRL LF DISP 70% ISPRP (MISCELLANEOUS) ×1
BLADE SURG 10 STRL SS (BLADE) ×1 IMPLANT
BLADE SURG 15 STRL LF DISP TIS (BLADE) ×2 IMPLANT
BLADE SURG 15 STRL SS (BLADE) ×1
CANISTER SUCT 1200ML W/VALVE (MISCELLANEOUS) ×1 IMPLANT
CHLORAPREP W/TINT 26 (MISCELLANEOUS) ×1 IMPLANT
CLSR STERI-STRIP ANTIMIC 1/2X4 (GAUZE/BANDAGES/DRESSINGS) ×1 IMPLANT
COOLER ICEMAN CLASSIC (MISCELLANEOUS) ×1 IMPLANT
COVER BACK TABLE 60X90IN (DRAPES) ×1 IMPLANT
COVER MAYO STAND STRL (DRAPES) ×1 IMPLANT
DERMABOND ADVANCED .7 DNX12 (GAUZE/BANDAGES/DRESSINGS) ×1 IMPLANT
DRAPE STERI IOBAN 125X83 (DRAPES) ×1 IMPLANT
DRAPE U-SHAPE 47X51 STRL (DRAPES) ×2 IMPLANT
DRSG AQUACEL AG ADV 3.5X 6 (GAUZE/BANDAGES/DRESSINGS) ×1 IMPLANT
DRSG AQUACEL AG ADV 3.5X10 (GAUZE/BANDAGES/DRESSINGS) IMPLANT
ELECT BLADE 4.0 EZ CLEAN MEGAD (MISCELLANEOUS) ×1
ELECT REM PT RETURN 9FT ADLT (ELECTROSURGICAL) ×1
ELECTRODE BLDE 4.0 EZ CLN MEGD (MISCELLANEOUS) IMPLANT
ELECTRODE REM PT RTRN 9FT ADLT (ELECTROSURGICAL) ×1 IMPLANT
GAUZE PAD ABD 8X10 STRL (GAUZE/BANDAGES/DRESSINGS) IMPLANT
GAUZE XEROFORM 1X8 LF (GAUZE/BANDAGES/DRESSINGS) IMPLANT
GLOVE BIO SURGEON STRL SZ 6 (GLOVE) ×2 IMPLANT
GLOVE BIO SURGEON STRL SZ7.5 (GLOVE) ×2 IMPLANT
GLOVE BIOGEL PI IND STRL 6.5 (GLOVE) ×1 IMPLANT
GLOVE BIOGEL PI IND STRL 8 (GLOVE) ×1 IMPLANT
GOWN STRL REUS W/ TWL LRG LVL3 (GOWN DISPOSABLE) ×2 IMPLANT
GOWN STRL REUS W/ TWL XL LVL3 (GOWN DISPOSABLE) IMPLANT
GOWN STRL REUS W/TWL LRG LVL3 (GOWN DISPOSABLE) ×1
GOWN STRL REUS W/TWL XL LVL3 (GOWN DISPOSABLE) ×3 IMPLANT
IMPL TAPESTRY BIOINTEGR 30X30 (Orthopedic Implant) IMPLANT
MANIFOLD NEPTUNE II (INSTRUMENTS) ×1 IMPLANT
NDL HYPO 22X1.5 SAFETY MO (MISCELLANEOUS) ×1 IMPLANT
NEEDLE HYPO 22X1.5 SAFETY MO (MISCELLANEOUS) ×1 IMPLANT
NS IRRIG 1000ML POUR BTL (IV SOLUTION) ×1 IMPLANT
PACK BASIN DAY SURGERY FS (CUSTOM PROCEDURE TRAY) ×1 IMPLANT
PAD COLD SHLDR WRAP-ON (PAD) ×1 IMPLANT
PENCIL SMOKE EVACUATOR (MISCELLANEOUS) ×1 IMPLANT
SPIKE FLUID TRANSFER (MISCELLANEOUS) IMPLANT
SPONGE T-LAP 18X18 ~~LOC~~+RFID (SPONGE) ×1 IMPLANT
SUCTION TUBE FRAZIER 10FR DISP (SUCTIONS) ×1 IMPLANT
SUT ETHILON 3 0 PS 1 (SUTURE) ×1 IMPLANT
SUT MNCRL AB 3-0 PS2 27 (SUTURE) ×1 IMPLANT
SUT VIC AB 0 CT1 27 (SUTURE) ×2
SUT VIC AB 0 CT1 27XBRD ANBCTR (SUTURE) ×1 IMPLANT
SUT VIC AB 2-0 CT1 27 (SUTURE) ×2
SUT VIC AB 2-0 CT1 TAPERPNT 27 (SUTURE) ×1 IMPLANT
SYR 20ML LL LF (SYRINGE) ×1 IMPLANT
SYR BULB EAR ULCER 3OZ GRN STR (SYRINGE) ×1 IMPLANT
TOWEL GREEN STERILE FF (TOWEL DISPOSABLE) ×2 IMPLANT
UNDERPAD 30X36 HEAVY ABSORB (UNDERPADS AND DIAPERS) IMPLANT
YANKAUER SUCT BULB TIP NO VENT (SUCTIONS) ×1 IMPLANT

## 2023-05-08 NOTE — H&P (Signed)
Chief Complaint: Left hip pain radiating leg pain        History of Present Illness:    01/31/2023: Presents today for follow-up of her left radiating hip pain as well as her MRI.  She is still persistently having pain laying directly on the side.     [name] is a 62 y.o. female presents today for her left hip and leg pain.  She states that for the last 3 years this is been radiating around from the back into the side of the hip and down the knee.  She did previously have epidural injections the second of which did not provide any relief.  She was seeing Rexene Edison prior who performed an MRI of her lower back.  She is quite limited in most activities of daily living due to this.  She is here today for further assessment       Surgical History:   none   PMH/PSH/Family History/Social History/Meds/Allergies:         Past Medical History:  Diagnosis Date   Arthritis      rheumatoid per pt             Past Surgical History:  Procedure Laterality Date   ABDOMINAL HYSTERECTOMY   2001    TAH/BSO fibroids   CATARACT EXTRACTION Right     COLONOSCOPY   04/27/2011   obstructed bile duct   2004    Dr Micki Riley   OOPHORECTOMY        BSO   REVERSE SHOULDER ARTHROPLASTY Left 02/14/2022    Procedure: LEFT REVERSE SHOULDER ARTHROPLASTY;  Surgeon: Huel Cote, MD;  Location: [facility] OR;  Service: Orthopedics;  Laterality: Left;   ROTATOR CUFF REPAIR Bilateral 12/2010    Left and right        Social History         Socioeconomic History   Marital status: Single      Spouse name: Not on file   Number of children: 1   Years of education: Not on file   Highest education level: Not on file  Occupational History   Occupation: MEAT CUTTER      Employer: [employer]  Tobacco Use   Smoking status: Never   Smokeless tobacco: Never  Vaping Use   Vaping Use: Never used  Substance and Sexual Activity   Alcohol use: Yes      Alcohol/week: 0.0 standard drinks of  alcohol      Comment: Occas. wine   Drug use: No   Sexual activity: Not Currently      Partners: Male      Birth control/protection: Surgical      Comment: 1st intercourse 62 yo-More than 5 partners,hysterectomy  Other Topics Concern   Not on file  Social History Narrative    Exercise-- water aerobics    Social Determinants of Health    Financial Resource Strain: Not on file  Food Insecurity: Not on file  Transportation Needs: Not on file  Physical Activity: Not on file  Stress: Not on file  Social Connections: Not on file         Family History  Problem Relation Age of Onset   Heart disease Mother 70        MI   Stroke Mother     Hypertension Mother     Arthritis Mother     Pulmonary embolism Mother     Prostate cancer Father     Hypertension  Father     Arthritis Sister     Colon cancer Neg Hx     Colon polyps Neg Hx     Esophageal cancer Neg Hx     Rectal cancer Neg Hx     Stomach cancer Neg Hx     Breast cancer Neg Hx          Allergies  No Known Allergies         Current Outpatient Medications  Medication Sig Dispense Refill   methylPREDNISolone (MEDROL DOSEPAK) 4 MG TBPK tablet Take per packet instructions 21 tablet 0   traMADol (ULTRAM) 50 MG tablet Take 1 tablet (50 mg total) by mouth every 6 (six) hours as needed. 30 tablet 2   Vitamin D, Ergocalciferol, (DRISDOL) 1.25 MG (50000 UNIT) CAPS capsule Take 1 capsule (50,000 Units total) by mouth every 7 (seven) days. 12 capsule 1   Cholecalciferol (VITAMIN D PO) Take 1 tablet by mouth 2 (two) times a week.       cyclobenzaprine (FLEXERIL) 5 MG tablet Take 1 tablet (5 mg total) by mouth at bedtime. (Patient taking differently: Take 5 mg by mouth daily as needed for muscle spasms.) 30 tablet 0   LORazepam (ATIVAN) 1 MG tablet Take 1 tablet (1 mg total) by mouth every 8 (eight) hours. 2 tablet 0   promethazine-dextromethorphan (PROMETHAZINE-DM) 6.25-15 MG/5ML syrup Take 5 mLs by mouth 4 (four) times daily as  needed. (Patient not taking: Reported on 01/18/2023) 118 mL 0      No current facility-administered medications for this visit.      Imaging Results (Last 48 hours)  No results found.     Review of Systems:   A ROS was performed including pertinent positives and negatives as documented in the HPI.   Physical Exam :   Constitutional: NAD and appears stated age Neurological: Alert and oriented Psych: Appropriate affect and cooperative There were no vitals taken for this visit.    Comprehensive Musculoskeletal Exam:     Inspection Right Left  Skin No atrophy or gross abnormalities appreciated No atrophy or gross abnormalities appreciated  Palpation      Tenderness None None  Crepitus None None  Range of Motion      Flexion (passive) 120 120  Extension 30 30  IR 30 30 no pain  ER 40 45 with pain lateral trochanter  Strength      Flexion  5/5 5/5  Extension 5/5 5/5  Special Tests      FABER Negative Negative  FADIR Negative Negative  ER Lag/Capsular Insufficiency Negative Negative  Instability Negative Negative  Sacroiliac pain Negative  Negative   Instability      Generalized Laxity No No  Neurologic      sciatic, femoral, obturator nerves intact to light sensation  Vascular/Lymphatic      DP pulse 2+ 2+  Lumbar Exam      Patient has symmetric lumbar range of motion with negative pain referral to hip        Imaging:   Xray (2 views left hip, 4 views left knee): Mild degenerative findings about the femoral acetabular joint, normal left knee   MRI (lumbar spine): There are some degenerative changes without any significant evidence of neural compression   MRI left hip: Near full-thickness gluteus medius tearing at the junction of the minimus and medius   I personally reviewed and interpreted the radiographs.     Assessment:   62 y.o. female with left hip pain  radiating around the side which is consistent with gluteus medius tear.  At this time she will try  physical therapy as well as an injection.  She did get initially very good relief from her injection although this is worn off.  We did discuss additional treatment options.  Given the fact that she is improving with physical therapy and is still having a difficult time laying directly on the side we did discuss the possibility of left hip gluteus medius repair.  I did discuss limitations restrictions associated with this.  After long discussion she has elected for this.   Plan :     -Plan for left hip gluteus medius repair with possible collagen patch augmentation     After a lengthy discussion of treatment options, including risks, benefits, alternatives, complications of surgical and nonsurgical conservative options, the patient elected surgical repair.    The patient  is aware of the material risks  and complications including, but not limited to injury to adjacent structures, neurovascular injury, infection, numbness, bleeding, implant failure, thermal burns, stiffness, persistent pain, failure to heal, disease transmission from allograft, need for further surgery, dislocation, anesthetic risks, blood clots, risks of death,and others. The probabilities of surgical success and failure discussed with patient given their particular co-morbidities.The time and nature of expected rehabilitation and recovery was discussed.The patient's questions were all answered preoperatively.  No barriers to understanding were noted. I explained the natural history of the disease process and Rx rationale.  I explained to the patient what I considered to be reasonable expectations given their personal situation.  The final treatment plan was arrived at through a shared patient decision making process model.           I personally saw and evaluated the patient, and participated in the management and treatment plan.   Huel Cote, MD Attending Physician, Orthopedic Surgery   This document was dictated using  Dragon voice recognition software. A reasonable attempt at proof reading has been made to minimize errors.

## 2023-05-08 NOTE — Anesthesia Procedure Notes (Signed)
Procedure Name: Intubation Date/Time: 05/08/2023 9:44 AM  Performed by: Karen Kitchens, CRNAPre-anesthesia Checklist: Patient identified, Emergency Drugs available, Suction available and Patient being monitored Patient Re-evaluated:Patient Re-evaluated prior to induction Oxygen Delivery Method: Circle system utilized Preoxygenation: Pre-oxygenation with 100% oxygen Induction Type: IV induction Ventilation: Mask ventilation without difficulty Laryngoscope Size: Mac and 4 Grade View: Grade I Tube type: Oral Number of attempts: 1 Airway Equipment and Method: Stylet and Oral airway Placement Confirmation: ETT inserted through vocal cords under direct vision, positive ETCO2, breath sounds checked- equal and bilateral and CO2 detector Secured at: 22 cm Tube secured with: Tape Dental Injury: Teeth and Oropharynx as per pre-operative assessment

## 2023-05-08 NOTE — Transfer of Care (Signed)
Immediate Anesthesia Transfer of Care Note  Patient: [name]  Procedure(s) Performed: LEFT GLUTEUS MEDIUS  REPAIR  COLLAGEN PATCH (Left: Hip)  Patient Location: PACU  Anesthesia Type:General  Level of Consciousness: awake, alert , and patient cooperative  Airway & Oxygen Therapy: Patient Spontanous Breathing and Patient connected to face mask oxygen  Post-op Assessment: Report given to RN and Post -op Vital signs reviewed and stable  Post vital signs: Reviewed and stable  Last Vitals:  Vitals Value Taken Time  BP 136/84 05/08/23 1047  Temp    Pulse 59 05/08/23 1048  Resp 14 05/08/23 1048  SpO2 100 % 05/08/23 1048  Vitals shown include unfiled device data.  Last Pain:  Vitals:   05/08/23 0834  TempSrc: Oral  PainSc: 8          Complications: No notable events documented.

## 2023-05-08 NOTE — Anesthesia Postprocedure Evaluation (Signed)
Anesthesia Post Note  Patient: [name]  Procedure(s) Performed: LEFT GLUTEUS MEDIUS  REPAIR  COLLAGEN PATCH (Left: Hip)     Patient location during evaluation: PACU Anesthesia Type: General Level of consciousness: awake and alert and oriented Pain management: pain level controlled Vital Signs Assessment: post-procedure vital signs reviewed and stable Respiratory status: spontaneous breathing, nonlabored ventilation and respiratory function stable Cardiovascular status: blood pressure returned to baseline and stable Postop Assessment: no apparent nausea or vomiting Anesthetic complications: no   No notable events documented.  Last Vitals:  Vitals:   05/08/23 1145 05/08/23 1208  BP: 107/73 117/80  Pulse: (!) 59 (!) 52  Resp: 17 20  Temp:  (!) 36.2 C  SpO2: (!) 89% 100%    Last Pain:  Vitals:   05/08/23 1208  TempSrc: Temporal  PainSc: 4                  Meggen Spaziani A.

## 2023-05-08 NOTE — Brief Op Note (Signed)
   Brief Op Note  Date of Surgery: 05/08/2023  Preoperative Diagnosis: LEFT GLUTEUS MEDIUS TEAR  Postoperative Diagnosis: same  Procedure: Procedure(s): LEFT GLUTEUS MEDIUS  REPAIR  COLLAGEN PATCH  Implants: Implant Name Type Inv. Item Serial No. Manufacturer Lot No. LRB No. Used Action  ANCHOR JUGGERKNOT SOFT 1.5 - [id number] Anchor ANCHOR JUGGERKNOT SOFT 1.5  [employer](ORTH,TRAU,BIO,SG) [id number] Left 2 Implanted  ANCH SUT KNTLS STRL SHLDR SYS - [id number] Anchor ANCH SUT KNTLS STRL SHLDR SYS  [employer]IM[employer]ON(ORTH,TRAU,BIO,SG) [id number] Left 1 Implanted  ANCH SUT KNTLS STRL SHLDR SYS - [id number] Anchor ANCH SUT KNTLS STRL SHLDR SYS  [employer]ON(ORTH,TRAU,BIO,SG) [id number] Left 1 Implanted  IMPL TAPESTRY BIOINTEGR 30X30 - [id number] Orthopedic Implant IMPL TAPESTRY BIOINTEGR 30X30  [employer]ON(ORTH,TRAU,BIO,SG) [id number] Left 1 Implanted    Surgeons: Surgeon(s): Huel Cote, MD  Anesthesia: General    Estimated Blood Loss: See anesthesia record  Complications: None  Condition to PACU: Stable  Benancio Deeds, MD 05/08/2023 10:37 AM

## 2023-05-08 NOTE — Discharge Instructions (Addendum)
Discharge Instructions    Attending Surgeon: Huel Cote, MD Office Phone Number: [phone]   Diagnosis and Procedures:    Surgeries Performed: Left hip gluteus medius repair  Discharge Plan:    Diet: Resume usual diet. Begin with light or bland foods.  Drink plenty of fluids.  Activity:  Touchdown weight bearing left leg. You are advised to go home directly from the hospital or surgical center. Restrict your activities.  GENERAL INSTRUCTIONS: 1.  Please apply ice to your wound to help with swelling and inflammation. This will improve your comfort and your overall recovery following surgery.     2. Please call Dr. Serena Croissant office at [phone] with questions Monday-Friday during business hours. If no one answers, please leave a message and someone should get back to the patient within 24 hours. For emergencies please call 911 or proceed to the emergency room.   3. Patient to notify surgical team if experiences any of the following: Bowel/Bladder dysfunction, uncontrolled pain, nerve/muscle weakness, incision with increased drainage or redness, nausea/vomiting and Fever greater than 101.0 F.  Be alert for signs of infection including redness, streaking, odor, fever or chills. Be alert for excessive pain or bleeding and notify your surgeon immediately.  WOUND INSTRUCTIONS:   Leave your dressing, cast, or splint in place until your post operative visit.  Keep it clean and dry.  Always keep the incision clean and dry until the staples/sutures are removed. If there is no drainage from the incision you should keep it open to air. If there is drainage from the incision you must keep it covered at all times until the drainage stops  Do not soak in a bath tub, hot tub, pool, lake or other body of water until 21 days after your surgery and your incision is completely dry and healed.  If you have removable sutures (or staples) they must be removed 10-14 days (unless otherwise  instructed) from the day of your surgery.     1)  Elevate the extremity as much as possible.  2)  Keep the dressing clean and dry.  3)  Please call us if the dressing becomes wet or dirty.  4)  If you are experiencing worsening pain or worsening swelling, please call.     MEDICATIONS: Resume all previous home medications at the previous prescribed dose and frequency unless otherwise noted Start taking the  pain medications on an as-needed basis as prescribed  Please taper down pain medication over the next week following surgery.  Ideally you should not require a refill of any narcotic pain medication.  Take pain medication with food to minimize nausea. In addition to the prescribed pain medication, you may take over-the-counter pain relievers such as Tylenol.  Do NOT take additional tylenol if your pain medication already has tylenol in it.  Aspirin 325mg  daily for four weeks.      FOLLOWUP INSTRUCTIONS: 1. Follow up at the Physical Therapy Clinic 3-4 days following surgery. This appointment should be scheduled unless other arrangements have been made.The Physical Therapy scheduling number is [phone] if an appointment has not already been arranged.  2. Contact Dr. Serena Croissant office during office hours at [phone] or the practice after hours line at [phone] for non-emergencies. For medical emergencies call 911.   Discharge Location: Home   Post Anesthesia Home Care Instructions  Activity: Get plenty of rest for the remainder of the day. A responsible individual must stay with you for 24 hours following the procedure.  For the next 24 hours, DO NOT: -Drive a car -Advertising copywriter -Drink alcoholic beverages -Take any medication unless instructed by your physician -Make any legal decisions or sign important papers.  Meals: Start with liquid foods such as gelatin or soup. Progress to regular foods as tolerated. Avoid greasy, spicy, heavy foods. If nausea and/or  vomiting occur, drink only clear liquids until the nausea and/or vomiting subsides. Call your physician if vomiting continues.  Special Instructions/Symptoms: Your throat may feel dry or sore from the anesthesia or the breathing tube placed in your throat during surgery. If this causes discomfort, gargle with warm salt water. The discomfort should disappear within 24 hours.  If you had a scopolamine patch placed behind your ear for the management of post- operative nausea and/or vomiting:  1. The medication in the patch is effective for 72 hours, after which it should be removed.  Wrap patch in a tissue and discard in the trash. Wash hands thoroughly with soap and water. 2. You may remove the patch earlier than 72 hours if you experience unpleasant side effects which may include dry mouth, dizziness or visual disturbances. 3. Avoid touching the patch. Wash your hands with soap and water after contact with the patch.     No tylenol until after 2:45pm if needed today.

## 2023-05-08 NOTE — Op Note (Addendum)
Date of Surgery: 05/08/2023  INDICATIONS: [name] is a 62 y.o.-year-old female with left hip gluteus medius tear.  The risk and benefits of the procedure were discussed in detail and documented in the pre-operative evaluation.   PREOPERATIVE DIAGNOSIS: 1. Left hip gluteus medius full thickness tear  POSTOPERATIVE DIAGNOSIS: Same.  PROCEDURE: 1. Left hip gluteus medius repair. 2. Left hip trochanteric bursectomy  SURGEON: Benancio Deeds MD  ASSISTANT: Kerby Less, ATC  ANESTHESIA:  general  IV FLUIDS AND URINE: See anesthesia record.  ANTIBIOTICS: Ancef  ESTIMATED BLOOD LOSS: 10 mL.  IMPLANTS:  Implant Name Type Inv. Item Serial No. Manufacturer Lot No. LRB No. Used Action  ANCHOR JUGGERKNOT SOFT 1.5 - [id number] Anchor ANCHOR JUGGERKNOT SOFT 1.5  [employer]IMMER [employer]ON(ORTH,TRAU,BIO,SG) [id number] Left 2 Implanted  ANCH SUT KNTLS STRL SHLDR SYS - [id number] Anchor ANCH SUT KNTLS STRL SHLDR SYS  ZIMMER [employer]ON(ORTH,TRAU,BIO,SG) [id number] Left 1 Implanted  ANCH SUT KNTLS STRL SHLDR SYS - [id number] Anchor ANCH SUT KNTLS STRL SHLDR SYS  ZIMMER [employer]ON(ORTH,TRAU,BIO,SG) [id number] Left 1 Implanted  IMPL TAPESTRY BIOINTEGR 30X30 - [id number] Orthopedic Implant IMPL TAPESTRY BIOINTEGR 30X30  ZIMMER [employer]ON(ORTH,TRAU,BIO,SG) [id number] Left 1 Implanted    DRAINS: None  CULTURES: None  COMPLICATIONS: none  DESCRIPTION OF PROCEDURE:   The patient was identified in the preoperative holding area.  The correct site was marked and confirmed according to nursing.  The patient was subsequently taken back to the operating room.  Antibiotics were given 1 hour prior to incision.  Anesthesia was induced.  He was placed in the lateral position with a beanbag positioner with care to pad the down leg and peroneal nerve.   Patient was prepped and draped in the usual sterile fashion.  Again timeout was performed confirming the correct side.  An approach was made over the lateral aspect of the greater trochanter.   Dissection was initially carried down sharply with 15 blade and electrocautery was used to perform hemostasis.  A 15 blade was used to make a nick in the IT band which was completed with Mayo scissors.  At this point we encountered the gluteus medius tendon. There was overlying bursa was dissected out sharply with Metzenbaum scissors and removed. This was extremely thin and then completely torn.  This was debrided and the trochanteric lateral posterior facet was prepared using a Cobb.  We then mobilized the gluteus medius abductor muscles with an Allis clamp.  Excess bursal tissue was excised sharply with Mayo scissors.  A double row type configuration was then utilized with 2 Medial Row all suture anchors (a total of 8 limbs).  These were placed through the tissue and then brought to 2 Lateral Row Quattro anchors.  This produced an anatomic footprint restoration of the gluteus tendon. At this point given the quality of the tendon tissue the decision was made to augment with collagen patch.  This was placed over the repair.  The corners were sutured into place with 0 Vicryl.   The wounds were thoroughly irrigated.  We closed in layers of 0 Vicryl for the IT band, 2-0 Vicryl, and staples for skin. The patient was awoken and taken to the PACU.  All counts were correct at the end of the case and no complications.     POSTOPERATIVE PLAN: She will be touchdown weightbearing for a total of 2 weeks.  She will use a walker for this.  I will plan to see her back in 2 weeks for wound check.  She will begin  physical therapy immediately.  She replaced on aspirin for blood clot prevention  Benancio Deeds, MD 10:37 AM

## 2023-05-08 NOTE — Interval H&P Note (Signed)
History and Physical Interval Note:  05/08/2023 9:19 AM  [name]  has presented today for surgery, with the diagnosis of LEFT GLUTEUS MEDIUS TEAR.  The various methods of treatment have been discussed with the patient and family. After consideration of risks, benefits and other options for treatment, the patient has consented to  Procedure(s): LEFT GLUTEUS MEDIUS  REPAIR POSSIBLE COLLAGEN PATCH (Left) as a surgical intervention.  The patient's history has been reviewed, patient examined, no change in status, stable for surgery.  I have reviewed the patient's chart and labs.  Questions were answered to the patient's satisfaction.     Huel Cote

## 2023-05-09 ENCOUNTER — Encounter (HOSPITAL_BASED_OUTPATIENT_CLINIC_OR_DEPARTMENT_OTHER): Payer: Self-pay | Admitting: Orthopaedic Surgery

## 2023-05-11 ENCOUNTER — Ambulatory Visit (HOSPITAL_BASED_OUTPATIENT_CLINIC_OR_DEPARTMENT_OTHER): Payer: 59 | Attending: Orthopaedic Surgery | Admitting: Physical Therapy

## 2023-05-11 DIAGNOSIS — M25552 Pain in left hip: Secondary | ICD-10-CM

## 2023-05-11 DIAGNOSIS — M67959 Unspecified disorder of synovium and tendon, unspecified thigh: Secondary | ICD-10-CM | POA: Insufficient documentation

## 2023-05-11 DIAGNOSIS — M25512 Pain in left shoulder: Secondary | ICD-10-CM

## 2023-05-11 DIAGNOSIS — R2689 Other abnormalities of gait and mobility: Secondary | ICD-10-CM

## 2023-05-11 DIAGNOSIS — M25652 Stiffness of left hip, not elsewhere classified: Secondary | ICD-10-CM

## 2023-05-11 DIAGNOSIS — M6281 Muscle weakness (generalized): Secondary | ICD-10-CM

## 2023-05-11 DIAGNOSIS — M25612 Stiffness of left shoulder, not elsewhere classified: Secondary | ICD-10-CM

## 2023-05-11 NOTE — Therapy (Addendum)
OUTPATIENT PHYSICAL THERAPY LOWER EXTREMITY EVALUATION   Patient Name: [name] MRN: [id number] DOB:[birth year], 62 y.o., female Today's Date: 05/29/2023  END OF SESSION:   Past Medical History:  Diagnosis Date   Arthritis    rheumatoid per pt   Past Surgical History:  Procedure Laterality Date   ABDOMINAL HYSTERECTOMY  2001   TAH/BSO fibroids   CATARACT EXTRACTION Right    COLONOSCOPY  04/27/2011   GLUTEUS MINIMUS REPAIR Left 05/08/2023   Procedure: LEFT GLUTEUS MEDIUS  REPAIR  COLLAGEN PATCH;  Surgeon: Huel Cote, MD;  Location: [facility];  Service: Orthopedics;  Laterality: Left;   obstructed bile duct  2004   Dr Micki Riley   OOPHORECTOMY     BSO   REVERSE SHOULDER ARTHROPLASTY Left 02/14/2022   Procedure: LEFT REVERSE SHOULDER ARTHROPLASTY;  Surgeon: Huel Cote, MD;  Location: [facility];  Service: Orthopedics;  Laterality: Left;   ROTATOR CUFF REPAIR Bilateral 12/2010   Left and right   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   Upper respiratory tract infection 04/06/2023   Sore throat 04/06/2023   COVID-19 09/28/2022   Right calf pain 09/28/2022   Acute cough 09/28/2022   Rotator cuff arthropathy of left shoulder    Preop examination 12/30/2021   Arthritis of carpometacarpal [facility]) joint of left thumb 12/02/2021   Lumbar radiculopathy 05/29/2017   Greater trochanteric bursitis of left hip 05/29/2017   Contusion, hip and thigh, left, initial encounter 05/29/2017   Left hip pain 04/30/2017   Abdominal pain, LLQ 04/30/2017   Back pain 02/16/2015   Obesity (BMI 30-39.9) 06/09/2013   OSTEOARTHRITIS 12/15/2009   IMPAIRMENT, ONE EYE MODERATE, OTHER NOS 07/01/2007   SBO 07/01/2007   HERNIA, UMBILICAL 06/11/2007    PCP: Dr Loreen Freud   REFERRING PROVIDER: Dr Huel Cote   REFERRING DIAG:  Diagnosis  M67.959 (ICD-10-CM) - Tendinopathy of gluteus medius    THERAPY DIAG:  Muscle  weakness (generalized) - Plan: PT plan of care cert/re-cert  Pain in left hip  Stiffness of left hip, not elsewhere classified  Other abnormalities of gait and mobility  Rationale for Evaluation and Treatment: Rehabilitation  ONSET DATE: 05/08/2023 glut med repair   SUBJECTIVE:   SUBJECTIVE STATEMENT:   PERTINENT HISTORY: Arthritis of left thumb , Left TSA, Back Pain  PAIN:  Are you having pain? Yes: NPRS scale: 9/10 currently  Pain location: Left lateral gluteal  Pain description: aching  Aggravating factors: sitting, standing , walking Relieving factors: ibuprofen   PRECAUTIONS: Other: glut repair   RED FLAGS: None   WEIGHT BEARING RESTRICTIONS: No  FALLS:  Has patient fallen in last 6 months? No  LIVING ENVIRONMENT: Flat into her house  OCCUPATION:  Retired   Presenter, broadcasting:  Walking    PLOF: Independent  PATIENT GOALS:  To have less pain   NEXT MD VISIT: 9/16   OBJECTIVE:   DIAGNOSTIC FINDINGS: Nothing postop  PATIENT SURVEYS:  FOTO    COGNITION: Overall cognitive status: Within functional limits for tasks assessed     SENSATION: Mild parathesias into the leg  EDEMA:  Nothing noticeable    POSTURE: No Significant postural limitations  PALPATION: Tenderness to palpation in gluteal  LOWER EXTREMITY ROM:  Passive ROM Right eval Left eval  Hip flexion    Hip extension    Hip abduction    Hip adduction    Hip internal rotation    Hip external rotation  Knee flexion    Knee extension    Ankle dorsiflexion    Ankle plantarflexion    Ankle inversion    Ankle eversion     (Blank rows = not tested)  LOWER EXTREMITY MMT:  MMT Right eval Left eval  Hip flexion 35 degrees with significant pain    Hip extension    Hip abduction    Hip adduction    Hip internal rotation Not tested 2nd to pain    Hip external rotation Not tested 2nd to pain    Knee flexion    Knee extension    Ankle dorsiflexion    Ankle plantarflexion    Ankle  inversion    Ankle eversion     (Blank rows = not tested)   GAIT:   TODAY'S TREATMENT:                                                                                                                              DATE: Access Code: [email or web address] URL: [email or web address] Date: 05/11/2023 Prepared by: Lorayne Bender  Exercises - Supine Quad Set  - 1 x daily - 7 x weekly - 3 sets - 10 reps - Supine Heel Slide with Strap  - 1 x daily - 7 x weekly - 3 sets - 10 reps - Seated Ankle Pumps  - 1 x daily - 7 x weekly - 3 sets - 10 reps   Reviewed proper weightbearing  Manual: Passive range of motion into hip flexion.   PATIENT EDUCATION:  Education details: HEP, symptom management, weightbearing status, Person educated: Patient Education method: Explanation, Demonstration, Tactile cues, Verbal cues, and Handouts Education comprehension: verbalized understanding, returned demonstration, verbal cues required, tactile cues required, and needs further education  HOME EXERCISE PROGRAM: Access Code: [email or web address] URL: [email or web address] Date: 05/11/2023 Prepared by: Lorayne Bender  Exercises - Supine Quad Set  - 1 x daily - 7 x weekly - 3 sets - 10 reps - Supine Heel Slide with Strap  - 1 x daily - 7 x weekly - 3 sets - 10 reps - Seated Ankle Pumps  - 1 x daily - 7 x weekly - 3 sets - 10 reps  ASSESSMENT:  CLINICAL IMPRESSION: Patient is a 62 year old female status post left glute med repair on 05/08/2023.  She presents with expected limitations in motion, function, strength, and ability to ambulate.  She is was significantly limited by pain today.  She has limitations in hip flexion.  Of note she did come in walking with a cane.  She is advised she is to be touchdown weightbearing for 2 weeks.  She had her brother bring her walker and and walked out with  touchdown weightbearing.  She will be going to [facility] for a week to stay with her sister.  She will not have  physical therapy in that time.  She was given a heel slide to address significant limitations in hip flexion.  She is advised to  movement pain-free ranges.  She would benefit from skilled therapy to return to prior level of function.  She enjoys walking for exercise. OBJECTIVE IMPAIRMENTS: Abnormal gait, decreased activity tolerance, decreased endurance, decreased mobility, difficulty walking, decreased ROM, decreased strength,  impaired perceived functional ability, and pain.   ACTIVITY LIMITATIONS: carrying, lifting, bending, sitting, standing, squatting, stairs, transfers, bed mobility, bathing, toileting, dressing, hygiene/grooming, and locomotion level  PARTICIPATION LIMITATIONS: meal prep, cleaning, laundry, driving, shopping, community activity, occupation, and yard work   PERSONAL FACTORS: 1-2 comorbidities: left total shoulder replacement   are also affecting patient's functional outcome.   REHAB POTENTIAL: Good  CLINICAL DECISION MAKING: Evolving/moderate complexity significant pain at this time   EVALUATION COMPLEXITY: Moderate   GOALS: Goals reviewed with patient? Yes  SHORT TERM GOALS: Target date: 06/08/2023   Patient will increase passive hip flexion to 90 degrees. Baseline: Goal status: INITIAL  2.  Patient will wean off of walker with progressive weightbearing without significant increase in pain Baseline:  Goal status: INITIAL  3.  Patient will be independent with basic HEP Baseline:  Goal status: INITIAL Goal status: INITIAL  LONG TERM GOALS: Target date: 08/03/2023    Patient will ambulate community distances without pain Baseline:  Goal status: INITIAL  2.  Patient will go up and down 8 steps without pain in order to improve community access Baseline:  Goal status: INITIAL  3.  Patient will stand for greater than an hour without pain in order to perform ADLs Baseline:  Goal status: INITIAL  4.  Patient will return to walking for  exercise. Baseline:  Goal status: INITIAL    PLAN:  PT FREQUENCY: 2x/week  PT DURATION: 12 weeks  PLANNED INTERVENTIONS: Therapeutic exercises, Therapeutic activity, Neuromuscular re-education, Balance training, Gait training, Patient/Family education, Self Care, Joint mobilization, Stair training, DME instructions, Aquatic Therapy, Dry Needling, Electrical stimulation, Cryotherapy, Moist heat, Taping, Manual therapy, and Re-evaluation.   PLAN FOR NEXT SESSION: Begin per gluteus medius repair protocol.  When allowed by Dr. Rickard Rhymes progressive weightbearing.   Dessie Coma, PT 05/29/2023, 1:59 PM

## 2023-05-14 ENCOUNTER — Ambulatory Visit (HOSPITAL_BASED_OUTPATIENT_CLINIC_OR_DEPARTMENT_OTHER): Payer: 59 | Admitting: Physical Therapy

## 2023-05-21 ENCOUNTER — Ambulatory Visit (INDEPENDENT_AMBULATORY_CARE_PROVIDER_SITE_OTHER): Payer: 59 | Admitting: Orthopaedic Surgery

## 2023-05-21 ENCOUNTER — Encounter (HOSPITAL_BASED_OUTPATIENT_CLINIC_OR_DEPARTMENT_OTHER): Payer: Self-pay

## 2023-05-21 ENCOUNTER — Ambulatory Visit (HOSPITAL_BASED_OUTPATIENT_CLINIC_OR_DEPARTMENT_OTHER): Payer: 59

## 2023-05-21 DIAGNOSIS — M25552 Pain in left hip: Secondary | ICD-10-CM

## 2023-05-21 DIAGNOSIS — M67959 Unspecified disorder of synovium and tendon, unspecified thigh: Secondary | ICD-10-CM

## 2023-05-21 DIAGNOSIS — M6281 Muscle weakness (generalized): Secondary | ICD-10-CM

## 2023-05-21 DIAGNOSIS — R2689 Other abnormalities of gait and mobility: Secondary | ICD-10-CM

## 2023-05-21 DIAGNOSIS — M25652 Stiffness of left hip, not elsewhere classified: Secondary | ICD-10-CM

## 2023-05-21 NOTE — Progress Notes (Signed)
Post Operative Evaluation    Procedure/Date of Surgery: Status post left gluteus medius repair 9/3  Interval History:   Presents today 2 weeks status post above procedure.  Overall she is doing very well.  Pain is very mild.  She is been compliant with range of motion and weightbearing restrictions   PMH/PSH/Family History/Social History/Meds/Allergies:    Past Medical History:  Diagnosis Date   Arthritis    rheumatoid per pt   Past Surgical History:  Procedure Laterality Date   ABDOMINAL HYSTERECTOMY  2001   TAH/BSO fibroids   CATARACT EXTRACTION Right    COLONOSCOPY  04/27/2011   GLUTEUS MINIMUS REPAIR Left 05/08/2023   Procedure: LEFT GLUTEUS MEDIUS  REPAIR  COLLAGEN PATCH;  Surgeon: Huel Cote, MD;  Location: [facility];  Service: Orthopedics;  Laterality: Left;   obstructed bile duct  2004   Dr Micki Riley   OOPHORECTOMY     BSO   REVERSE SHOULDER ARTHROPLASTY Left 02/14/2022   Procedure: LEFT REVERSE SHOULDER ARTHROPLASTY;  Surgeon: Huel Cote, MD;  Location: [facility];  Service: Orthopedics;  Laterality: Left;   ROTATOR CUFF REPAIR Bilateral 12/2010   Left and right   Social History   Socioeconomic History   Marital status: Single    Spouse name: Not on file   Number of children: 1   Years of education: Not on file   Highest education level: Not on file  Occupational History   Occupation: MEAT CUTTER    Employer: [employer]  Tobacco Use   Smoking status: Never   Smokeless tobacco: Never  Vaping Use   Vaping status: Never Used  Substance and Sexual Activity   Alcohol use: Yes    Alcohol/week: 0.0 standard drinks of alcohol    Comment: Occas. wine   Drug use: No   Sexual activity: Not Currently    Partners: Male    Birth control/protection: Surgical    Comment: 1st intercourse 62 yo-More than 5 partners,hysterectomy  Other Topics Concern   Not on file  Social History Narrative   Exercise-- water  aerobics   Social Determinants of Health   Financial Resource Strain: Not on file  Food Insecurity: Not on file  Transportation Needs: Not on file  Physical Activity: Not on file  Stress: Not on file  Social Connections: Not on file   Family History  Problem Relation Age of Onset   Heart disease Mother 48       MI   Stroke Mother    Hypertension Mother    Arthritis Mother    Pulmonary embolism Mother    Prostate cancer Father    Hypertension Father    Arthritis Sister    Colon cancer Neg Hx    Colon polyps Neg Hx    Esophageal cancer Neg Hx    Rectal cancer Neg Hx    Stomach cancer Neg Hx    Breast cancer Neg Hx    No Known Allergies Current Outpatient Medications  Medication Sig Dispense Refill   aspirin EC 325 MG tablet Take 1 tablet (325 mg total) by mouth daily. 30 tablet 0   azelastine (ASTELIN) 0.1 % nasal spray Place 1 spray into both nostrils 2 (two) times daily. Use in each nostril as directed (Patient not taking: Reported on 05/11/2023) 30 mL 12  Cholecalciferol (VITAMIN D PO) Take 1 tablet by mouth 2 (two) times a week.     cyclobenzaprine (FLEXERIL) 5 MG tablet Take 1 tablet (5 mg total) by mouth at bedtime. (Patient taking differently: Take 5 mg by mouth daily as needed for muscle spasms.) 30 tablet 0   levocetirizine (XYZAL) 5 MG tablet Take 1 tablet (5 mg total) by mouth every evening. 30 tablet 5   LORazepam (ATIVAN) 1 MG tablet Take 1 tablet (1 mg total) by mouth every 8 (eight) hours. 2 tablet 0   oxyCODONE (ROXICODONE) 5 MG immediate release tablet Take 1 tablet (5 mg total) by mouth every 4 (four) hours as needed for severe pain or breakthrough pain. 15 tablet 0   traMADol (ULTRAM) 50 MG tablet Take 1 tablet (50 mg total) by mouth every 6 (six) hours as needed. 30 tablet 2   traMADol (ULTRAM) 50 MG tablet Take 1 tablet (50 mg total) by mouth every 6 (six) hours as needed. 30 tablet 2   Vitamin D, Ergocalciferol, (DRISDOL) 1.25 MG (50000 UNIT) CAPS capsule  Take 1 capsule (50,000 Units total) by mouth every 7 (seven) days. 12 capsule 1   No current facility-administered medications for this visit.   No results found.  Review of Systems:   A ROS was performed including pertinent positives and negatives as documented in the HPI.   Musculoskeletal Exam:    There were no vitals taken for this visit.  Incision is well-appearing without erythema or drainage.  Internal extra rotation is 30 degrees with active flexion to 90 degrees of the hip.  Distal neurovascular exam is intact  Imaging:      I personally reviewed and interpreted the radiographs.   Assessment:   2 weeks status post left hip gluteus medius repair overall doing extremely well.  At this time she will advance her weightbearing to as tolerated per the gluteus protocol.  Plan :    -Return to clinic 4 weeks      I personally saw and evaluated the patient, and participated in the management and treatment plan.  Huel Cote, MD Attending Physician, Orthopedic Surgery  This document was dictated using Dragon voice recognition software. A reasonable attempt at proof reading has been made to minimize errors.

## 2023-05-21 NOTE — Therapy (Signed)
OUTPATIENT PHYSICAL THERAPY LOWER EXTREMITY TREATMENT   Patient Name: [name] MRN: [id number] DOB:[birth year], 62 y.o., female Today's Date: 05/21/2023  END OF SESSION:  PT End of Session - 05/21/23 0918     Visit Number 2    Number of Visits 24    Date for PT Re-Evaluation 08/03/23    Authorization Type [employer]et[employer]    PT Start Time 0933    PT Stop Time 1008    PT Time Calculation (min) 35 min    Activity Tolerance Patient tolerated treatment well    Behavior During Therapy [facility] for tasks assessed/performed             Past Medical History:  Diagnosis Date   Arthritis    rheumatoid per pt   Past Surgical History:  Procedure Laterality Date   ABDOMINAL HYSTERECTOMY  2001   TAH/BSO fibroids   CATARACT EXTRACTION Right    COLONOSCOPY  04/27/2011   GLUTEUS MINIMUS REPAIR Left 05/08/2023   Procedure: LEFT GLUTEUS MEDIUS  REPAIR  COLLAGEN PATCH;  Surgeon: Huel Cote, MD;  Location: [facility];  Service: Orthopedics;  Laterality: Left;   obstructed bile duct  2004   Dr Micki Riley   OOPHORECTOMY     BSO   REVERSE SHOULDER ARTHROPLASTY Left 02/14/2022   Procedure: LEFT REVERSE SHOULDER ARTHROPLASTY;  Surgeon: Huel Cote, MD;  Location: [facility];  Service: Orthopedics;  Laterality: Left;   ROTATOR CUFF REPAIR Bilateral 12/2010   Left and right   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   Upper respiratory tract infection 04/06/2023   Sore throat 04/06/2023   COVID-19 09/28/2022   Right calf pain 09/28/2022   Acute cough 09/28/2022   Rotator cuff arthropathy of left shoulder    Preop examination 12/30/2021   Arthritis of carpometacarpal [facility]) joint of left thumb 12/02/2021   Lumbar radiculopathy 05/29/2017   Greater trochanteric bursitis of left hip 05/29/2017   Contusion, hip and thigh, left, initial encounter 05/29/2017   Left hip pain 04/30/2017   Abdominal pain, LLQ 04/30/2017   Back pain  02/16/2015   Obesity (BMI 30-39.9) 06/09/2013   OSTEOARTHRITIS 12/15/2009   IMPAIRMENT, ONE EYE MODERATE, OTHER NOS 07/01/2007   SBO 07/01/2007   HERNIA, UMBILICAL 06/11/2007    PCP: Dr Loreen Freud   REFERRING PROVIDER: Dr Huel Cote   REFERRING DIAG:  Diagnosis  M67.959 (ICD-10-CM) - Tendinopathy of gluteus medius    THERAPY DIAG:  Muscle weakness (generalized)  Pain in left hip  Stiffness of left hip, not elsewhere classified  Other abnormalities of gait and mobility  Rationale for Evaluation and Treatment: Rehabilitation  ONSET DATE: 05/08/2023 glut med repair   SUBJECTIVE:   SUBJECTIVE STATEMENT:  Pt reports stiffness but no pain. Compliant with HEP.   PERTINENT HISTORY: Arthritis of left thumb , Left TSA, Back Pain  PAIN:  Are you having pain? No: NPRS scale: 0/10 currently  Pain location: Left lateral gluteal  Pain description: aching  Aggravating factors: sitting, standing , walking Relieving factors: ibuprofen   PRECAUTIONS: Other: glut repair   RED FLAGS: None   WEIGHT BEARING RESTRICTIONS: No  FALLS:  Has patient fallen in last 6 months? No  LIVING ENVIRONMENT: Flat into her house  OCCUPATION:  Retired   Hobbies:  Walking    PLOF: Independent  PATIENT GOALS:  To have less pain   NEXT MD VISIT: 9/16   OBJECTIVE:   DIAGNOSTIC FINDINGS: Nothing  postop  PATIENT SURVEYS:  FOTO    COGNITION: Overall cognitive status: Within functional limits for tasks assessed     SENSATION: Mild parathesias into the leg  EDEMA:  Nothing noticeable    POSTURE: No Significant postural limitations  PALPATION: Tenderness to palpation in gluteal  LOWER EXTREMITY ROM:  Passive ROM Right eval Left eval  Hip flexion    Hip extension    Hip abduction    Hip adduction    Hip internal rotation    Hip external rotation    Knee flexion    Knee extension    Ankle dorsiflexion    Ankle plantarflexion    Ankle inversion    Ankle  eversion     (Blank rows = not tested)  LOWER EXTREMITY MMT:  MMT Right eval Left eval  Hip flexion 35 degrees with significant pain    Hip extension    Hip abduction    Hip adduction    Hip internal rotation Not tested 2nd to pain    Hip external rotation Not tested 2nd to pain    Knee flexion    Knee extension    Ankle dorsiflexion    Ankle plantarflexion    Ankle inversion    Ankle eversion     (Blank rows = not tested)   GAIT:   TODAY'S TREATMENT:                                                                                                                               DATE:9/16  PROM L hip within protocol.  Quad set 5" x20 Glute set 5" x20 Adductor squeeze 3" x20 LAQ 3# 5" hold x20L Gait training with FWW and with SPC hall x1lap each  DATE: Access Code: [email or web address] URL: [email or web address] Date: 05/11/2023 Prepared by: Lorayne Bender  Exercises - Supine Quad Set  - 1 x daily - 7 x weekly - 3 sets - 10 reps - Supine Heel Slide with Strap  - 1 x daily - 7 x weekly - 3 sets - 10 reps - Seated Ankle Pumps  - 1 x daily - 7 x weekly - 3 sets - 10 reps   Reviewed proper weightbearing  Manual: Passive range of motion into hip flexion.   PATIENT EDUCATION:  Education details: HEP, symptom management, weightbearing status, Person educated: Patient Education method: Explanation, Demonstration, Tactile cues, Verbal cues, and Handouts Education comprehension: verbalized understanding, returned demonstration, verbal cues required, tactile cues required, and needs further education  HOME EXERCISE PROGRAM: Access Code: [email or web address] URL: [email or web address] Date: 05/11/2023 Prepared by: Lorayne Bender  Exercises - Supine Quad Set  - 1 x daily - 7 x weekly - 3 sets - 10 reps - Supine Heel Slide with Strap  - 1 x daily - 7 x weekly - 3 sets - 10 reps - Seated Ankle Pumps  - 1 x daily - 7 x weekly - 3 sets - 10  reps  ASSESSMENT:  CLINICAL IMPRESSION: Pt will be 2 weeks s/p tomorrow. Guarded with passive movement. Careful to stay within protocol limits. Trialed gait training with SPC, which pt was able to do with minimal difficulty. Instructed her to continue with FWW primarily until steadiness and endurance improves. Has MD follow up today after PT session. OBJECTIVE IMPAIRMENTS: Abnormal gait, decreased activity tolerance, decreased endurance, decreased mobility, difficulty walking, decreased ROM, decreased strength,  impaired perceived functional ability, and pain.   ACTIVITY LIMITATIONS: carrying, lifting, bending, sitting, standing, squatting, stairs, transfers, bed mobility, bathing, toileting, dressing, hygiene/grooming, and locomotion level  PARTICIPATION LIMITATIONS: meal prep, cleaning, laundry, driving, shopping, community activity, occupation, and yard work   PERSONAL FACTORS: 1-2 comorbidities: left total shoulder replacement   are also affecting patient's functional outcome.   REHAB POTENTIAL: Good  CLINICAL DECISION MAKING: Evolving/moderate complexity significant pain at this time   EVALUATION COMPLEXITY: Moderate   GOALS: Goals reviewed with patient? Yes  SHORT TERM GOALS: Target date: 06/08/2023   Patient will increase passive hip flexion to 90 degrees. Baseline: Goal status: INITIAL  2.  Patient will wean off of walker with progressive weightbearing without significant increase in pain Baseline:  Goal status: INITIAL  3.  Patient will be independent with basic HEP Baseline:  Goal status: INITIAL Goal status: INITIAL  LONG TERM GOALS: Target date: 08/03/2023    Patient will ambulate community distances without pain Baseline:  Goal status: INITIAL  2.  Patient will go up and down 8 steps without pain in order to improve community access Baseline:  Goal status: INITIAL  3.  Patient will stand for greater than an hour without pain in order to perform  ADLs Baseline:  Goal status: INITIAL  4.  Patient will return to walking for exercise. Baseline:  Goal status: INITIAL    PLAN:  PT FREQUENCY: 2x/week  PT DURATION: 12 weeks  PLANNED INTERVENTIONS: Therapeutic exercises, Therapeutic activity, Neuromuscular re-education, Balance training, Gait training, Patient/Family education, Self Care, Joint mobilization, Stair training, DME instructions, Aquatic Therapy, Dry Needling, Electrical stimulation, Cryotherapy, Moist heat, Taping, Manual therapy, and Re-evaluation.   PLAN FOR NEXT SESSION: Begin per gluteus medius repair protocol.  When allowed by Dr. Rickard Rhymes progressive weightbearing.   Donnel Saxon Aleathea Pugmire, PTA 05/21/2023, 4:43 PM

## 2023-05-29 ENCOUNTER — Ambulatory Visit (HOSPITAL_BASED_OUTPATIENT_CLINIC_OR_DEPARTMENT_OTHER): Payer: 59 | Admitting: Physical Therapy

## 2023-05-29 DIAGNOSIS — M6281 Muscle weakness (generalized): Secondary | ICD-10-CM

## 2023-05-29 DIAGNOSIS — M67959 Unspecified disorder of synovium and tendon, unspecified thigh: Secondary | ICD-10-CM | POA: Diagnosis not present

## 2023-05-29 DIAGNOSIS — M25652 Stiffness of left hip, not elsewhere classified: Secondary | ICD-10-CM

## 2023-05-29 DIAGNOSIS — M25552 Pain in left hip: Secondary | ICD-10-CM

## 2023-05-29 DIAGNOSIS — R2689 Other abnormalities of gait and mobility: Secondary | ICD-10-CM

## 2023-05-29 NOTE — Addendum Note (Signed)
Addended by: Dessie Coma on: 05/29/2023 02:00 PM   Modules accepted: Orders

## 2023-05-29 NOTE — Therapy (Signed)
OUTPATIENT PHYSICAL THERAPY LOWER EXTREMITY TREATMENT   Patient Name: [name] MRN: [id number] DOB:06/11/1961, 62 y.o., female Today's Date: 05/29/2023  END OF SESSION:  PT End of Session - 05/29/23 1228     Visit Number 3    Number of Visits 24    Date for PT Re-Evaluation 08/03/23    Authorization Type Aetna    PT Start Time 1015    PT Stop Time 1058    PT Time Calculation (min) 43 min    Activity Tolerance Patient tolerated treatment well    Behavior During Therapy [facility] for tasks assessed/performed              Past Medical History:  Diagnosis Date   Arthritis    rheumatoid per pt   Past Surgical History:  Procedure Laterality Date   ABDOMINAL HYSTERECTOMY  2001   TAH/BSO fibroids   CATARACT EXTRACTION Right    COLONOSCOPY  04/27/2011   GLUTEUS MINIMUS REPAIR Left 05/08/2023   Procedure: LEFT GLUTEUS MEDIUS  REPAIR  COLLAGEN PATCH;  Surgeon: Huel Cote, MD;  Location: [facility];  Service: Orthopedics;  Laterality: Left;   obstructed bile duct  2004   Dr Micki Riley   OOPHORECTOMY     BSO   REVERSE SHOULDER ARTHROPLASTY Left 02/14/2022   Procedure: LEFT REVERSE SHOULDER ARTHROPLASTY;  Surgeon: Huel Cote, MD;  Location: [facility];  Service: Orthopedics;  Laterality: Left;   ROTATOR CUFF REPAIR Bilateral 12/2010   Left and right   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   Upper respiratory tract infection 04/06/2023   Sore throat 04/06/2023   COVID-19 09/28/2022   Right calf pain 09/28/2022   Acute cough 09/28/2022   Rotator cuff arthropathy of left shoulder    Preop examination 12/30/2021   Arthritis of carpometacarpal [facility]) joint of left thumb 12/02/2021   Lumbar radiculopathy 05/29/2017   Greater trochanteric bursitis of left hip 05/29/2017   Contusion, hip and thigh, left, initial encounter 05/29/2017   Left hip pain 04/30/2017   Abdominal pain, LLQ 04/30/2017   Back pain  02/16/2015   Obesity (BMI 30-39.9) 06/09/2013   OSTEOARTHRITIS 12/15/2009   IMPAIRMENT, ONE EYE MODERATE, OTHER NOS 07/01/2007   SBO 07/01/2007   HERNIA, UMBILICAL 06/11/2007    PCP: Dr Loreen Freud   REFERRING PROVIDER: Dr Huel Cote   REFERRING DIAG:  Diagnosis  M67.959 (ICD-10-CM) - Tendinopathy of gluteus medius    THERAPY DIAG:  Muscle weakness (generalized)  Pain in left hip  Stiffness of left hip, not elsewhere classified  Other abnormalities of gait and mobility  Rationale for Evaluation and Treatment: Rehabilitation  ONSET DATE: 05/08/2023 glut med repair   SUBJECTIVE:   SUBJECTIVE STATEMENT:  Pt reports stiffness but no pain. Compliant with HEP.   PERTINENT HISTORY: Arthritis of left thumb , Left TSA, Back Pain  PAIN:  Are you having pain? No: NPRS scale: 0/10 currently  Pain location: Left lateral gluteal  Pain description: aching  Aggravating factors: sitting, standing , walking Relieving factors: ibuprofen   PRECAUTIONS: Other: glut repair   RED FLAGS: None   WEIGHT BEARING RESTRICTIONS: No  FALLS:  Has patient fallen in last 6 months? No  LIVING ENVIRONMENT: Flat into her house  OCCUPATION:  Retired   Hobbies:  Walking    PLOF: Independent  PATIENT GOALS:  To have less pain   NEXT MD VISIT: 9/16   OBJECTIVE:   DIAGNOSTIC FINDINGS:  Nothing postop  PATIENT SURVEYS:  FOTO    COGNITION: Overall cognitive status: Within functional limits for tasks assessed     SENSATION: Mild parathesias into the leg  EDEMA:  Nothing noticeable    POSTURE: No Significant postural limitations  PALPATION: Tenderness to palpation in gluteal  LOWER EXTREMITY ROM:  Passive ROM Right eval Left eval  Hip flexion    Hip extension    Hip abduction    Hip adduction    Hip internal rotation    Hip external rotation    Knee flexion    Knee extension    Ankle dorsiflexion    Ankle plantarflexion    Ankle inversion    Ankle  eversion     (Blank rows = not tested)  LOWER EXTREMITY MMT:  MMT Right eval Left eval  Hip flexion 35 degrees with significant pain    Hip extension    Hip abduction    Hip adduction    Hip internal rotation Not tested 2nd to pain    Hip external rotation Not tested 2nd to pain    Knee flexion    Knee extension    Ankle dorsiflexion    Ankle plantarflexion    Ankle inversion    Ankle eversion     (Blank rows = not tested)   GAIT:   TODAY'S TREATMENT:                                                                                                                              9/24 Manual: Trigger point release to upper gluteal   Nu-step: below 90 degrees for self stretch 5 min.   SAQ 3x10  LAQ 3 # 3x10  Quad set x20   Seated:  Hip abduction isometric 3x10   Standing weight shift 3x10    DATE:9/16  PROM L hip within protocol.  Quad set 5" x20 Glute set 5" x20 Adductor squeeze 3" x20 LAQ 3# 5" hold x20L Gait training with FWW and with SPC hall x1lap each  DATE: Access Code: [email or web address] URL: [email or web address] Date: 05/11/2023 Prepared by: Lorayne Bender  Exercises - Supine Quad Set  - 1 x daily - 7 x weekly - 3 sets - 10 reps - Supine Heel Slide with Strap  - 1 x daily - 7 x weekly - 3 sets - 10 reps - Seated Ankle Pumps  - 1 x daily - 7 x weekly - 3 sets - 10 reps   Reviewed proper weightbearing  Manual: Passive range of motion into hip flexion.   PATIENT EDUCATION:  Education details: HEP, symptom management, weightbearing status, Person educated: Patient Education method: Explanation, Demonstration, Tactile cues, Verbal cues, and Handouts Education comprehension: verbalized understanding, returned demonstration, verbal cues required, tactile cues required, and needs further education  HOME EXERCISE PROGRAM: Access Code: [email or web address] URL: [email or web address] Date: 05/11/2023 Prepared by: Lorayne Bender  Exercises -  Supine Quad Set  - 1 x daily - 7 x  weekly - 3 sets - 10 reps - Supine Heel Slide with Strap  - 1 x daily - 7 x weekly - 3 sets - 10 reps - Seated Ankle Pumps  - 1 x daily - 7 x weekly - 3 sets - 10 reps  ASSESSMENT:  CLINICAL IMPRESSION:  Therapy trailed hip abduction isometrics with patient. She tolerated well. She was less guarded today with PROM. We put her on the nu-step for self PROM. She reported mild pain following   OBJECTIVE IMPAIRMENTS: Abnormal gait, decreased activity tolerance, decreased endurance, decreased mobility, difficulty walking, decreased ROM, decreased strength,  impaired perceived functional ability, and pain.   ACTIVITY LIMITATIONS: carrying, lifting, bending, sitting, standing, squatting, stairs, transfers, bed mobility, bathing, toileting, dressing, hygiene/grooming, and locomotion level  PARTICIPATION LIMITATIONS: meal prep, cleaning, laundry, driving, shopping, community activity, occupation, and yard work   PERSONAL FACTORS: 1-2 comorbidities: left total shoulder replacement   are also affecting patient's functional outcome.   REHAB POTENTIAL: Good  CLINICAL DECISION MAKING: Evolving/moderate complexity significant pain at this time   EVALUATION COMPLEXITY: Moderate   GOALS: Goals reviewed with patient? Yes  SHORT TERM GOALS: Target date: 06/08/2023   Patient will increase passive hip flexion to 90 degrees. Baseline: Goal status: INITIAL  2.  Patient will wean off of walker with progressive weightbearing without significant increase in pain Baseline:  Goal status: INITIAL  3.  Patient will be independent with basic HEP Baseline:  Goal status: INITIAL Goal status: INITIAL  LONG TERM GOALS: Target date: 08/03/2023    Patient will ambulate community distances without pain Baseline:  Goal status: INITIAL  2.  Patient will go up and down 8 steps without pain in order to improve community access Baseline:  Goal status: INITIAL  3.   Patient will stand for greater than an hour without pain in order to perform ADLs Baseline:  Goal status: INITIAL  4.  Patient will return to walking for exercise. Baseline:  Goal status: INITIAL    PLAN:  PT FREQUENCY: 2x/week  PT DURATION: 12 weeks  PLANNED INTERVENTIONS: Therapeutic exercises, Therapeutic activity, Neuromuscular re-education, Balance training, Gait training, Patient/Family education, Self Care, Joint mobilization, Stair training, DME instructions, Aquatic Therapy, Dry Needling, Electrical stimulation, Cryotherapy, Moist heat, Taping, Manual therapy, and Re-evaluation.   PLAN FOR NEXT SESSION: Begin per gluteus medius repair protocol.  When allowed by Dr. Rickard Rhymes progressive weightbearing.   Dessie Coma, PT 05/29/2023, 12:30 PM

## 2023-06-05 ENCOUNTER — Encounter (HOSPITAL_BASED_OUTPATIENT_CLINIC_OR_DEPARTMENT_OTHER): Payer: Self-pay | Admitting: Physical Therapy

## 2023-06-05 ENCOUNTER — Ambulatory Visit (HOSPITAL_BASED_OUTPATIENT_CLINIC_OR_DEPARTMENT_OTHER): Payer: 59 | Attending: Orthopaedic Surgery | Admitting: Physical Therapy

## 2023-06-05 DIAGNOSIS — M6281 Muscle weakness (generalized): Secondary | ICD-10-CM | POA: Diagnosis not present

## 2023-06-05 DIAGNOSIS — R2689 Other abnormalities of gait and mobility: Secondary | ICD-10-CM | POA: Insufficient documentation

## 2023-06-05 DIAGNOSIS — M25652 Stiffness of left hip, not elsewhere classified: Secondary | ICD-10-CM | POA: Insufficient documentation

## 2023-06-05 DIAGNOSIS — M25552 Pain in left hip: Secondary | ICD-10-CM | POA: Diagnosis not present

## 2023-06-05 NOTE — Therapy (Signed)
OUTPATIENT PHYSICAL THERAPY LOWER EXTREMITY TREATMENT   Patient Name: [name] MRN: [id number] DOB:[birth year], 62 y.o., female Today's Date: 06/05/2023  END OF SESSION:  PT End of Session - 06/05/23 0940     Visit Number 4    Number of Visits 24    Date for PT Re-Evaluation 08/03/23    Authorization Type [employer]    PT Start Time 0930    PT Stop Time 1012    PT Time Calculation (min) 42 min    Activity Tolerance Patient tolerated treatment well    Behavior During Therapy [facility] for tasks assessed/performed              Past Medical History:  Diagnosis Date   Arthritis    rheumatoid per pt   Past Surgical History:  Procedure Laterality Date   ABDOMINAL HYSTERECTOMY  2001   TAH/BSO fibroids   CATARACT EXTRACTION Right    COLONOSCOPY  04/27/2011   GLUTEUS MINIMUS REPAIR Left 05/08/2023   Procedure: LEFT GLUTEUS MEDIUS  REPAIR  COLLAGEN PATCH;  Surgeon: Huel Cote, MD;  Location:  [facility]ERY [facility];  Service: Orthopedics;  Laterality: Left;   obstructed bile duct  2004   Dr Micki Riley   OOPHORECTOMY     BSO   REVERSE SHOULDER ARTHROPLASTY Left 02/14/2022   Procedure: LEFT REVERSE SHOULDER ARTHROPLASTY;  Surgeon: Huel Cote, MD;  Location: [facility] OR;  Service: Orthopedics;  Laterality: Left;   ROTATOR CUFF REPAIR Bilateral 12/2010   Left and right   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   Upper respiratory tract infection 04/06/2023   Sore throat 04/06/2023   COVID-19 09/28/2022   Right calf pain 09/28/2022   Acute cough 09/28/2022   Rotator cuff arthropathy of left shoulder    Preop examination 12/30/2021   Arthritis of carpometacarpal [facility]) joint of left thumb 12/02/2021   Lumbar radiculopathy 05/29/2017   Greater trochanteric bursitis of left hip 05/29/2017   Contusion, hip and thigh, left, initial encounter 05/29/2017   Left hip pain 04/30/2017   Abdominal pain, LLQ 04/30/2017   Back pain  02/16/2015   Obesity (BMI 30-39.9) 06/09/2013   OSTEOARTHRITIS 12/15/2009   IMPAIRMENT, ONE EYE MODERATE, OTHER NOS 07/01/2007   SBO 07/01/2007   HERNIA, UMBILICAL 06/11/2007    PCP: Dr Loreen Freud   REFERRING PROVIDER: Dr Huel Cote   REFERRING DIAG:  Diagnosis  M67.959 (ICD-10-CM) - Tendinopathy of gluteus medius    THERAPY DIAG:  No diagnosis found.  Rationale for Evaluation and Treatment: Rehabilitation  ONSET DATE: 05/08/2023 glut med repair   SUBJECTIVE:   SUBJECTIVE STATEMENT: The patient comes in today with significant left hip pain.  She is using her cane.  She has significant lateral movement she reports the pain started this morning.  She reports overall the pain levels have been manageable.  She has been able to work on her exercises PERTINENT HISTORY: Arthritis of left thumb , Left TSA, Back Pain  PAIN:  Are you having pain? No: NPRS scale: 7 /10 currently  Pain location: Left lateral gluteal  Pain description: aching  Aggravating factors: sitting, standing , walking Relieving factors: ibuprofen   PRECAUTIONS: Other: glut repair   RED FLAGS: None   WEIGHT BEARING RESTRICTIONS: No  FALLS:  Has patient fallen in last 6 months? No  LIVING ENVIRONMENT: Flat into her house  OCCUPATION:  Retired   Hobbies:  Walking    PLOF: Independent  PATIENT GOALS:  To have less pain   NEXT MD VISIT: 9/16   OBJECTIVE:   DIAGNOSTIC FINDINGS: Nothing postop  PATIENT SURVEYS:  FOTO    COGNITION: Overall cognitive status: Within functional limits for tasks assessed     SENSATION: Mild parathesias into the leg  EDEMA:  Nothing noticeable    POSTURE: No Significant postural limitations  PALPATION: Tenderness to palpation in gluteal  LOWER EXTREMITY ROM:  Passive ROM Right eval Left eval  Hip flexion    Hip extension    Hip abduction    Hip adduction    Hip internal rotation    Hip external rotation    Knee flexion    Knee  extension    Ankle dorsiflexion    Ankle plantarflexion    Ankle inversion    Ankle eversion     (Blank rows = not tested)  LOWER EXTREMITY MMT:  MMT Right eval Left eval  Hip flexion 35 degrees with significant pain    Hip extension    Hip abduction    Hip adduction    Hip internal rotation Not tested 2nd to pain    Hip external rotation Not tested 2nd to pain    Knee flexion    Knee extension    Ankle dorsiflexion    Ankle plantarflexion    Ankle inversion    Ankle eversion     (Blank rows = not tested)   GAIT:   TODAY'S TREATMENT:                                                                                                                               10/1 Manual: Trigger point release to upper gluteal  in sidelying position after about 15 minutes the patients pain increased     9/24 Manual: Trigger point release to upper gluteal   Nu-step: below 90 degrees for self stretch 5 min.   SAQ 3x10  LAQ 3 # 3x10  Quad set x20   Seated:  Hip abduction isometric 3x10   Standing weight shift 3x10    DATE:9/16  PROM L hip within protocol.  Quad set 5" x20 Glute set 5" x20 Adductor squeeze 3" x20 LAQ 3# 5" hold x20L Gait training with FWW and with SPC hall x1lap each  DATE: Access Code: [email or web address] URL: [email or web address] Date: 05/11/2023 Prepared by: Lorayne Bender  Exercises - Supine Quad Set  - 1 x daily - 7 x weekly - 3 sets - 10 reps - Supine Heel Slide with Strap  - 1 x daily - 7 x weekly - 3 sets - 10 reps - Seated Ankle Pumps  - 1 x daily - 7 x weekly - 3 sets - 10 reps   Reviewed proper weightbearing  Manual: Passive range of motion into hip flexion.   PATIENT EDUCATION:  Education details: HEP, symptom management, weightbearing status, Person educated: Patient Education method: Explanation, Demonstration, Tactile cues, Verbal cues, and Handouts Education comprehension: verbalized understanding,  returned demonstration,  verbal cues required, tactile cues required, and needs further education  HOME EXERCISE PROGRAM: Access Code: [email or web address] URL: [email or web address] Date: 05/11/2023 Prepared by: Lorayne Bender  Exercises - Supine Quad Set  - 1 x daily - 7 x weekly - 3 sets - 10 reps - Supine Heel Slide with Strap  - 1 x daily - 7 x weekly - 3 sets - 10 reps - Seated Ankle Pumps  - 1 x daily - 7 x weekly - 3 sets - 10 reps  ASSESSMENT:  CLINICAL IMPRESSION:  The patient was significantly limited by pain this morning.  We attempted to have her stretch her hip out with a light NuStep.  She reported no increase in pain but no decrease in pain with this.  We then tried soft tissue mobilization to see if we could reduce the pain.  This seemed to exacerbate the pain.  She reported the pain levels were increasing.  Session was halted today.  She will go home and try heat or ice.  She is advised to try to move during the day.  Seems more like an acute muscle spasm although she had no significant improvement with soft tissue mobilization.  We will reassess on Thursday.   OBJECTIVE IMPAIRMENTS: Abnormal gait, decreased activity tolerance, decreased endurance, decreased mobility, difficulty walking, decreased ROM, decreased strength,  impaired perceived functional ability, and pain.   ACTIVITY LIMITATIONS: carrying, lifting, bending, sitting, standing, squatting, stairs, transfers, bed mobility, bathing, toileting, dressing, hygiene/grooming, and locomotion level  PARTICIPATION LIMITATIONS: meal prep, cleaning, laundry, driving, shopping, community activity, occupation, and yard work   PERSONAL FACTORS: 1-2 comorbidities: left total shoulder replacement   are also affecting patient's functional outcome.   REHAB POTENTIAL: Good  CLINICAL DECISION MAKING: Evolving/moderate complexity significant pain at this time   EVALUATION COMPLEXITY: Moderate   GOALS: Goals reviewed with patient? Yes  SHORT  TERM GOALS: Target date: 06/08/2023   Patient will increase passive hip flexion to 90 degrees. Baseline: Goal status: INITIAL  2.  Patient will wean off of walker with progressive weightbearing without significant increase in pain Baseline:  Goal status: INITIAL  3.  Patient will be independent with basic HEP Baseline:  Goal status: INITIAL Goal status: INITIAL  LONG TERM GOALS: Target date: 08/03/2023    Patient will ambulate community distances without pain Baseline:  Goal status: INITIAL  2.  Patient will go up and down 8 steps without pain in order to improve community access Baseline:  Goal status: INITIAL  3.  Patient will stand for greater than an hour without pain in order to perform ADLs Baseline:  Goal status: INITIAL  4.  Patient will return to walking for exercise. Baseline:  Goal status: INITIAL    PLAN:  PT FREQUENCY: 2x/week  PT DURATION: 12 weeks  PLANNED INTERVENTIONS: Therapeutic exercises, Therapeutic activity, Neuromuscular re-education, Balance training, Gait training, Patient/Family education, Self Care, Joint mobilization, Stair training, DME instructions, Aquatic Therapy, Dry Needling, Electrical stimulation, Cryotherapy, Moist heat, Taping, Manual therapy, and Re-evaluation.   PLAN FOR NEXT SESSION: Begin per gluteus medius repair protocol.  When allowed by Dr. Rickard Rhymes progressive weightbearing.   Dessie Coma, PT 06/05/2023, 9:54 AM

## 2023-06-07 ENCOUNTER — Encounter (HOSPITAL_BASED_OUTPATIENT_CLINIC_OR_DEPARTMENT_OTHER): Payer: Self-pay

## 2023-06-07 ENCOUNTER — Ambulatory Visit (HOSPITAL_BASED_OUTPATIENT_CLINIC_OR_DEPARTMENT_OTHER): Payer: 59

## 2023-06-07 DIAGNOSIS — M25652 Stiffness of left hip, not elsewhere classified: Secondary | ICD-10-CM

## 2023-06-07 DIAGNOSIS — M6281 Muscle weakness (generalized): Secondary | ICD-10-CM | POA: Diagnosis not present

## 2023-06-07 DIAGNOSIS — R2689 Other abnormalities of gait and mobility: Secondary | ICD-10-CM | POA: Diagnosis not present

## 2023-06-07 DIAGNOSIS — M25552 Pain in left hip: Secondary | ICD-10-CM | POA: Diagnosis not present

## 2023-06-07 NOTE — Therapy (Signed)
OUTPATIENT PHYSICAL THERAPY LOWER EXTREMITY TREATMENT   Patient Name: [name] MRN: [id number] DOB:[birth year], 62 y.o., female Today's Date: 06/07/2023  END OF SESSION:  PT End of Session - 06/07/23 1000     Visit Number 5    Number of Visits 24    Date for PT Re-Evaluation 08/03/23    Authorization Type Aet[employer]    PT Start Time 1020    PT Stop Time 1100    PT Time Calculation (min) 40 min    Activity Tolerance Patient tolerated treatment well    Behavior During Therapy [facility] for tasks assessed/performed               Past Medical History:  Diagnosis Date   Arthritis    rheumatoid per pt   Past Surgical History:  Procedure Laterality Date   ABDOMINAL HYSTERECTOMY  2001   TAH/BSO fibroids   CATARACT EXTRACTION Right    COLONOSCOPY  04/27/2011   GLUTEUS MINIMUS REPAIR Left 05/08/2023   Procedure: LEFT GLUTEUS MEDIUS  REPAIR  COLLAGEN PATCH;  Surgeon: Huel Cote, MD;  Location: [facility];  Service: Orthopedics;  Laterality: Left;   obstructed bile duct  2004   Dr Micki Riley   OOPHORECTOMY     BSO   REVERSE SHOULDER ARTHROPLASTY Left 02/14/2022   Procedure: LEFT REVERSE SHOULDER ARTHROPLASTY;  Surgeon: Huel Cote, MD;  Location: [facility] OR;  Service: Orthopedics;  Laterality: Left;   ROTATOR CUFF REPAIR Bilateral 12/2010   Left and right   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   Upper respiratory tract infection 04/06/2023   Sore throat 04/06/2023   COVID-19 09/28/2022   Right calf pain 09/28/2022   Acute cough 09/28/2022   Rotator cuff arthropathy of left shoulder    Preop examination 12/30/2021   Arthritis of carpometacarpal [facility]) joint of left thumb 12/02/2021   Lumbar radiculopathy 05/29/2017   Greater trochanteric bursitis of left hip 05/29/2017   Contusion, hip and thigh, left, initial encounter 05/29/2017   Left hip pain 04/30/2017   Abdominal pain, LLQ 04/30/2017   Back  pain 02/16/2015   Obesity (BMI 30-39.9) 06/09/2013   OSTEOARTHRITIS 12/15/2009   IMPAIRMENT, ONE EYE MODERATE, OTHER NOS 07/01/2007   SBO 07/01/2007   HERNIA, UMBILICAL 06/11/2007    PCP: Dr Loreen Freud   REFERRING PROVIDER: Dr Huel Cote   REFERRING DIAG:  Diagnosis  M67.959 (ICD-10-CM) - Tendinopathy of gluteus medius    THERAPY DIAG:  Muscle weakness (generalized)  Pain in left hip  Stiffness of left hip, not elsewhere classified  Rationale for Evaluation and Treatment: Rehabilitation  ONSET DATE: 05/08/2023 glut med repair   SUBJECTIVE:   SUBJECTIVE STATEMENT: Pt reports improved pain level from last visit. Pain is more in posterior hip and across low back. Denies pain around incision area. Arrives with [facility], but states she has not really been using it outside of here.     PERTINENT HISTORY: Arthritis of left thumb , Left TSA, Back Pain  PAIN:  Are you having pain? No: NPRS scale: 2 /10 currently  Pain location: Left lateral gluteal and across low back Pain description: aching  Aggravating factors: sitting, standing , walking Relieving factors: ibuprofen   PRECAUTIONS: Other: glut repair   RED FLAGS: None   WEIGHT BEARING RESTRICTIONS: No  FALLS:  Has patient fallen in last 6 months? No  LIVING ENVIRONMENT: Flat into her house  OCCUPATION:  Retired   Presenter, broadcasting:  Walking    PLOF: Independent  PATIENT GOALS:  To have less pain   NEXT MD VISIT: 9/16   OBJECTIVE:   DIAGNOSTIC FINDINGS: Nothing postop  PATIENT SURVEYS:  FOTO    COGNITION: Overall cognitive status: Within functional limits for tasks assessed     SENSATION: Mild parathesias into the leg  EDEMA:  Nothing noticeable    POSTURE: No Significant postural limitations  PALPATION: Tenderness to palpation in gluteal  LOWER EXTREMITY ROM:  Passive ROM Right eval Left eval  Hip flexion    Hip extension    Hip abduction    Hip adduction    Hip internal rotation     Hip external rotation    Knee flexion    Knee extension    Ankle dorsiflexion    Ankle plantarflexion    Ankle inversion    Ankle eversion     (Blank rows = not tested)  LOWER EXTREMITY MMT:  MMT Right eval Left eval  Hip flexion 35 degrees with significant pain    Hip extension    Hip abduction    Hip adduction    Hip internal rotation Not tested 2nd to pain    Hip external rotation Not tested 2nd to pain    Knee flexion    Knee extension    Ankle dorsiflexion    Ankle plantarflexion    Ankle inversion    Ankle eversion     (Blank rows = not tested)   GAIT:   TODAY'S TREATMENT:                                                                                                                                10/3 -PROM L hip within protocol limits -Hooklying adductor squeeze 5" 2x10 -Bridges 2x10 -PPT-5" 2x10 LAQ 3#-3" 2x10 -Nu- step partial hip flexion/extension L2 x5 min -standing HR/TR x20   10/1 Manual: Trigger point release to upper gluteal  in sidelying position after about 15 minutes the patients pain increased     9/24 Manual: Trigger point release to upper gluteal   Nu-step: below 90 degrees for self stretch 5 min.   SAQ 3x10  LAQ 3 # 3x10  Quad set x20   Seated:  Hip abduction isometric 3x10   Standing weight shift 3x10    DATE:9/16  PROM L hip within protocol.  Quad set 5" x20 Glute set 5" x20 Adductor squeeze 3" x20 LAQ 3# 5" hold x20L Gait training with FWW and with SPC hall x1lap each  DATE: Access Code: [email or web address] URL: [email or web address] Date: 05/11/2023 Prepared by: Lorayne Bender  Exercises - Supine Quad Set  - 1 x daily - 7 x weekly - 3 sets - 10 reps - Supine Heel Slide with Strap  - 1 x daily - 7 x weekly - 3 sets - 10 reps - Seated Ankle Pumps  - 1 x daily - 7 x weekly - 3 sets - 10 reps  Reviewed proper weightbearing  Manual: Passive range of motion into hip flexion.   PATIENT EDUCATION:   Education details: HEP, symptom management, weightbearing status, Person educated: Patient Education method: Explanation, Demonstration, Tactile cues, Verbal cues, and Handouts Education comprehension: verbalized understanding, returned demonstration, verbal cues required, tactile cues required, and needs further education  HOME EXERCISE PROGRAM: Access Code: [email or web address] URL: [email or web address] Date: 05/11/2023 Prepared by: Lorayne Bender  Exercises - Supine Quad Set  - 1 x daily - 7 x weekly - 3 sets - 10 reps - Supine Heel Slide with Strap  - 1 x daily - 7 x weekly - 3 sets - 10 reps - Seated Ankle Pumps  - 1 x daily - 7 x weekly - 3 sets - 10 reps  ASSESSMENT:  CLINICAL IMPRESSION:  Pt is 4 weeks s/p today. Some L knee discomfort with hip PROM, so careful to monitor this. Required cues to decrease muscle guarding with passive movement. Able to initiate bridges today without c/o hip or low back discomfort. No complaints with nu-step for partial hip flexion/extension. Reminded pt of current restrictions. Will continue to progress as tolerated.    OBJECTIVE IMPAIRMENTS: Abnormal gait, decreased activity tolerance, decreased endurance, decreased mobility, difficulty walking, decreased ROM, decreased strength,  impaired perceived functional ability, and pain.   ACTIVITY LIMITATIONS: carrying, lifting, bending, sitting, standing, squatting, stairs, transfers, bed mobility, bathing, toileting, dressing, hygiene/grooming, and locomotion level  PARTICIPATION LIMITATIONS: meal prep, cleaning, laundry, driving, shopping, community activity, occupation, and yard work   PERSONAL FACTORS: 1-2 comorbidities: left total shoulder replacement   are also affecting patient's functional outcome.   REHAB POTENTIAL: Good  CLINICAL DECISION MAKING: Evolving/moderate complexity significant pain at this time   EVALUATION COMPLEXITY: Moderate   GOALS: Goals reviewed with patient?  Yes  SHORT TERM GOALS: Target date: 06/08/2023   Patient will increase passive hip flexion to 90 degrees. Baseline: Goal status: INITIAL  2.  Patient will wean off of walker with progressive weightbearing without significant increase in pain Baseline:  Goal status: INITIAL  3.  Patient will be independent with basic HEP Baseline:  Goal status: INITIAL Goal status: INITIAL  LONG TERM GOALS: Target date: 08/03/2023    Patient will ambulate community distances without pain Baseline:  Goal status: INITIAL  2.  Patient will go up and down 8 steps without pain in order to improve community access Baseline:  Goal status: INITIAL  3.  Patient will stand for greater than an hour without pain in order to perform ADLs Baseline:  Goal status: INITIAL  4.  Patient will return to walking for exercise. Baseline:  Goal status: INITIAL    PLAN:  PT FREQUENCY: 2x/week  PT DURATION: 12 weeks  PLANNED INTERVENTIONS: Therapeutic exercises, Therapeutic activity, Neuromuscular re-education, Balance training, Gait training, Patient/Family education, Self Care, Joint mobilization, Stair training, DME instructions, Aquatic Therapy, Dry Needling, Electrical stimulation, Cryotherapy, Moist heat, Taping, Manual therapy, and Re-evaluation.   PLAN FOR NEXT SESSION: Begin per gluteus medius repair protocol.  When allowed by Dr. Rickard Rhymes progressive weightbearing.   Donnel Saxon Rae Plotner, PTA 06/07/2023, 12:12 PM

## 2023-06-12 ENCOUNTER — Ambulatory Visit (HOSPITAL_BASED_OUTPATIENT_CLINIC_OR_DEPARTMENT_OTHER): Payer: 59 | Admitting: Physical Therapy

## 2023-06-12 DIAGNOSIS — M25552 Pain in left hip: Secondary | ICD-10-CM

## 2023-06-12 DIAGNOSIS — R2689 Other abnormalities of gait and mobility: Secondary | ICD-10-CM | POA: Diagnosis not present

## 2023-06-12 DIAGNOSIS — M25652 Stiffness of left hip, not elsewhere classified: Secondary | ICD-10-CM

## 2023-06-12 DIAGNOSIS — M6281 Muscle weakness (generalized): Secondary | ICD-10-CM

## 2023-06-12 NOTE — Therapy (Signed)
OUTPATIENT PHYSICAL THERAPY LOWER EXTREMITY TREATMENT   Patient Name: [name] MRN: [id number] DOB:08/28/1961, 62 y.o., female Today's Date: 06/12/2023  END OF SESSION:  PT End of Session - 06/12/23 0943     Visit Number 6    Number of Visits 24    Date for PT Re-Evaluation 08/03/23    Authorization Type [employer]et[employer]    PT Start Time 0930    PT Stop Time 1012    PT Time Calculation (min) 42 min    Activity Tolerance Patient tolerated treatment well    Behavior During Therapy [facility] for tasks assessed/performed               Past Medical History:  Diagnosis Date   Arthritis    rheumatoid per pt   Past Surgical History:  Procedure Laterality Date   ABDOMINAL HYSTERECTOMY  2001   TAH/BSO fibroids   CATARACT EXTRACTION Right    COLONOSCOPY  04/27/2011   GLUTEUS MINIMUS REPAIR Left 05/08/2023   Procedure: LEFT GLUTEUS MEDIUS  REPAIR  COLLAGEN PATCH;  Surgeon: Huel Cote, MD;  Location:  SUR[facility]ERY C[facility];  Service: Orthopedics;  Laterality: Left;   obstructed bile duct  2004   Dr Micki Riley   OOPHORECTOMY     BSO   REVERSE SHOULDER ARTHROPLASTY Left 02/14/2022   Procedure: LEFT REVERSE SHOULDER ARTHROPLASTY;  Surgeon: Huel Cote, MD;  Location: [facility] OR;  Service: Orthopedics;  Laterality: Left;   ROTATOR CUFF REPAIR Bilateral 12/2010   Left and right   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   Upper respiratory tract infection 04/06/2023   Sore throat 04/06/2023   COVID-19 09/28/2022   Right calf pain 09/28/2022   Acute cough 09/28/2022   Rotator cuff arthropathy of left shoulder    Preop examination 12/30/2021   Arthritis of carpometacarpal [facility]) joint of left thumb 12/02/2021   Lumbar radiculopathy 05/29/2017   Greater trochanteric bursitis of left hip 05/29/2017   Contusion, hip and thigh, left, initial encounter 05/29/2017   Left hip pain 04/30/2017   Abdominal pain, LLQ 04/30/2017   Back  pain 02/16/2015   Obesity (BMI 30-39.9) 06/09/2013   OSTEOARTHRITIS 12/15/2009   IMPAIRMENT, ONE EYE MODERATE, OTHER NOS 07/01/2007   SBO 07/01/2007   HERNIA, UMBILICAL 06/11/2007    PCP: Dr Loreen Freud   REFERRING PROVIDER: Dr Huel Cote   REFERRING DIAG:  Diagnosis  M67.959 (ICD-10-CM) - Tendinopathy of gluteus medius    THERAPY DIAG:  No diagnosis found.  Rationale for Evaluation and Treatment: Rehabilitation  ONSET DATE: 05/08/2023 glut med repair   SUBJECTIVE:   SUBJECTIVE STATEMENT: Patient reports that she is having a pretty good day. She has bot been using the cane at home. She continues to have a mild antalgic gait.   PERTINENT HISTORY: Arthritis of left thumb , Left TSA, Back Pain  PAIN:  Are you having pain? No: NPRS scale: 2 /10 currently  Pain location: Left lateral gluteal and across low back Pain description: aching  Aggravating factors: sitting, standing , walking Relieving factors: ibuprofen   PRECAUTIONS: Other: glut repair   RED FLAGS: None   WEIGHT BEARING RESTRICTIONS: No  FALLS:  Has patient fallen in last 6 months? No  LIVING ENVIRONMENT: Flat into her house  OCCUPATION:  Retired   Hobbies:  Walking    PLOF: Independent  PATIENT GOALS:  To have less pain   NEXT MD VISIT: 9/16   OBJECTIVE:  DIAGNOSTIC FINDINGS: Nothing postop  PATIENT SURVEYS:  FOTO    COGNITION: Overall cognitive status: Within functional limits for tasks assessed     SENSATION: Mild parathesias into the leg  EDEMA:  Nothing noticeable    POSTURE: No Significant postural limitations  PALPATION: Tenderness to palpation in gluteal  LOWER EXTREMITY ROM:  Passive ROM Right eval Left eval  Hip flexion    Hip extension    Hip abduction    Hip adduction    Hip internal rotation    Hip external rotation    Knee flexion    Knee extension    Ankle dorsiflexion    Ankle plantarflexion    Ankle inversion    Ankle eversion     (Blank  rows = not tested)  LOWER EXTREMITY MMT:  MMT Right eval Left eval  Hip flexion 35 degrees with significant pain    Hip extension    Hip abduction    Hip adduction    Hip internal rotation Not tested 2nd to pain    Hip external rotation Not tested 2nd to pain    Knee flexion    Knee extension    Ankle dorsiflexion    Ankle plantarflexion    Ankle inversion    Ankle eversion     (Blank rows = not tested)   GAIT:   TODAY'S TREATMENT:                                                                                                                              Manual: PROM per protocol limit   Bridges 2x10  PPT 2x10  SAQ 2x10  Abductior squeeze 3x10    LAQ 3"2x10   Standing weight shift 2x15   Air-ex narrow base eo and ec 2x20 sec hold      10/3 -PROM L hip within protocol limits -Hooklying adductor squeeze 5" 2x10 -Bridges 2x10 -PPT-5" 2x10 LAQ 3#-3" 2x10 -Nu- step partial hip flexion/extension L2 x5 min -standing HR/TR x20   10/1 Manual: Trigger point release to upper gluteal  in sidelying position after about 15 minutes the patients pain increased     9/24 Manual: Trigger point release to upper gluteal   Nu-step: below 90 degrees for self stretch 5 min.   SAQ 3x10  LAQ 3 # 3x10  Quad set x20   Seated:  Hip abduction isometric 3x10   Standing weight shift 3x10    DATE:9/16  PROM L hip within protocol.  Quad set 5" x20 Glute set 5" x20 Adductor squeeze 3" x20 LAQ 3# 5" hold x20L Gait training with FWW and with SPC hall x1lap each  DATE: Access Code: [email or web address] URL: [email or web address] Date: 05/11/2023 Prepared by: Lorayne Bender  Exercises - Supine Quad Set  - 1 x daily - 7 x weekly - 3 sets - 10 reps - Supine Heel Slide with Strap  - 1 x daily - 7 x weekly - 3 sets - 10 reps -  Seated Ankle Pumps  - 1 x daily - 7 x weekly - 3 sets - 10 reps   Reviewed proper weightbearing  Manual: Passive range of motion into hip  flexion.   PATIENT EDUCATION:  Education details: HEP, symptom management, weightbearing status, Person educated: Patient Education method: Explanation, Demonstration, Tactile cues, Verbal cues, and Handouts Education comprehension: verbalized understanding, returned demonstration, verbal cues required, tactile cues required, and needs further education  HOME EXERCISE PROGRAM: Access Code: [email or web address] URL: [email or web address] Date: 05/11/2023 Prepared by: Lorayne Bender  Exercises - Supine Quad Set  - 1 x daily - 7 x weekly - 3 sets - 10 reps - Supine Heel Slide with Strap  - 1 x daily - 7 x weekly - 3 sets - 10 reps - Seated Ankle Pumps  - 1 x daily - 7 x weekly - 3 sets - 10 reps  ASSESSMENT:  CLINICAL IMPRESSION: The patient is progressing. Her PROM into hip flexion was measured at 80 degrees with mild pain. She still has some limitations into flexion. We reviewed how to do a heel slide at home to practice. She may discharge next week 2nd to transportation. She feels like she has the exercises she needs to progress. We updated her HEP today.   OBJECTIVE IMPAIRMENTS: Abnormal gait, decreased activity tolerance, decreased endurance, decreased mobility, difficulty walking, decreased ROM, decreased strength,  impaired perceived functional ability, and pain.   ACTIVITY LIMITATIONS: carrying, lifting, bending, sitting, standing, squatting, stairs, transfers, bed mobility, bathing, toileting, dressing, hygiene/grooming, and locomotion level  PARTICIPATION LIMITATIONS: meal prep, cleaning, laundry, driving, shopping, community activity, occupation, and yard work   PERSONAL FACTORS: 1-2 comorbidities: left total shoulder replacement   are also affecting patient's functional outcome.   REHAB POTENTIAL: Good  CLINICAL DECISION MAKING: Evolving/moderate complexity significant pain at this time   EVALUATION COMPLEXITY: Moderate   GOALS: Goals reviewed with patient?  Yes  SHORT TERM GOALS: Target date: 07/10/2023     Patient will increase passive hip flexion to 90 degrees. Baseline: Goal status: 80 degrees today 10/8 2.  Patient will wean off of walker with progressive weightbearing without significant increase in pain Baseline:  Goal status: using a cane 10/8   3.  Patient will be independent with basic HEP Baseline:  Goal status: INITIAL Goal status: INITIAL  LONG TERM GOALS: Target date: 08/03/2023    Patient will ambulate community distances without pain Baseline:  Goal status: INITIAL  2.  Patient will go up and down 8 steps without pain in order to improve community access Baseline:  Goal status: INITIAL  3.  Patient will stand for greater than an hour without pain in order to perform ADLs Baseline:  Goal status: INITIAL  4.  Patient will return to walking for exercise. Baseline:  Goal status: INITIAL    PLAN:  PT FREQUENCY: 2x/week  PT DURATION: 12 weeks  PLANNED INTERVENTIONS: Therapeutic exercises, Therapeutic activity, Neuromuscular re-education, Balance training, Gait training, Patient/Family education, Self Care, Joint mobilization, Stair training, DME instructions, Aquatic Therapy, Dry Needling, Electrical stimulation, Cryotherapy, Moist heat, Taping, Manual therapy, and Re-evaluation.   PLAN FOR NEXT SESSION: Begin per gluteus medius repair protocol.  When allowed by Dr. Rickard Rhymes progressive weightbearing.   Dessie Coma, PT 06/12/2023, 10:33 AM

## 2023-06-14 ENCOUNTER — Ambulatory Visit (HOSPITAL_BASED_OUTPATIENT_CLINIC_OR_DEPARTMENT_OTHER): Payer: 59

## 2023-06-14 ENCOUNTER — Encounter (HOSPITAL_BASED_OUTPATIENT_CLINIC_OR_DEPARTMENT_OTHER): Payer: Self-pay

## 2023-06-14 DIAGNOSIS — M25552 Pain in left hip: Secondary | ICD-10-CM

## 2023-06-14 DIAGNOSIS — R2689 Other abnormalities of gait and mobility: Secondary | ICD-10-CM | POA: Diagnosis not present

## 2023-06-14 DIAGNOSIS — M6281 Muscle weakness (generalized): Secondary | ICD-10-CM

## 2023-06-14 DIAGNOSIS — M25652 Stiffness of left hip, not elsewhere classified: Secondary | ICD-10-CM

## 2023-06-14 NOTE — Therapy (Signed)
OUTPATIENT PHYSICAL THERAPY LOWER EXTREMITY TREATMENT   Patient Name: [name] MRN: [id number] DOB:[birth year], 62 y.o., female Today's Date: 06/14/2023  END OF SESSION:  PT End of Session - 06/14/23 1011     Visit Number 7    Number of Visits 24    Date for PT Re-Evaluation 08/03/23    Authorization Type [employer]et[employer]    PT Start Time 0932    PT Stop Time 1015    PT Time Calculation (min) 43 min    Activity Tolerance Patient tolerated treatment well    Behavior During Therapy [facility] for tasks assessed/performed                Past Medical History:  Diagnosis Date   Arthritis    rheumatoid per pt   Past Surgical History:  Procedure Laterality Date   ABDOMINAL HYSTERECTOMY  2001   TAH/BSO fibroids   CATARACT EXTRACTION Right    COLONOSCOPY  04/27/2011   GLUTEUS MINIMUS REPAIR Left 05/08/2023   Procedure: LEFT GLUTEUS MEDIUS  REPAIR  COLLAGEN PATCH;  Surgeon: Huel Cote, MD;  Location: [facility];  Service: Orthopedics;  Laterality: Left;   obstructed bile duct  2004   Dr Micki Riley   OOPHORECTOMY     BSO   REVERSE SHOULDER ARTHROPLASTY Left 02/14/2022   Procedure: LEFT REVERSE SHOULDER ARTHROPLASTY;  Surgeon: Huel Cote, MD;  Location: [facility] OR;  Service: Orthopedics;  Laterality: Left;   ROTATOR CUFF REPAIR Bilateral 12/2010   Left and right   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   Upper respiratory tract infection 04/06/2023   Sore throat 04/06/2023   COVID-19 09/28/2022   Right calf pain 09/28/2022   Acute cough 09/28/2022   Rotator cuff arthropathy of left shoulder    Preop examination 12/30/2021   Arthritis of carpometacarpal [facility]) joint of left thumb 12/02/2021   Lumbar radiculopathy 05/29/2017   Greater trochanteric bursitis of left hip 05/29/2017   Contusion, hip and thigh, left, initial encounter 05/29/2017   Left hip pain 04/30/2017   Abdominal pain, LLQ 04/30/2017   Back  pain 02/16/2015   Obesity (BMI 30-39.9) 06/09/2013   OSTEOARTHRITIS 12/15/2009   IMPAIRMENT, ONE EYE MODERATE, OTHER NOS 07/01/2007   SBO 07/01/2007   HERNIA, UMBILICAL 06/11/2007    PCP: Dr Loreen Freud   REFERRING PROVIDER: Dr Huel Cote   REFERRING DIAG:  Diagnosis  M67.959 (ICD-10-CM) - Tendinopathy of gluteus medius    THERAPY DIAG:  Muscle weakness (generalized)  Stiffness of left hip, not elsewhere classified  Pain in left hip  Rationale for Evaluation and Treatment: Rehabilitation  ONSET DATE: 05/08/2023 glut med repair   SUBJECTIVE:   SUBJECTIVE STATEMENT: Patient arrives with SPC, though not using it. Denies pain at entry.   PERTINENT HISTORY: Arthritis of left thumb , Left TSA, Back Pain  PAIN:  Are you having pain? No: NPRS scale: 0 /10 currently  Pain location: Left lateral gluteal and across low back Pain description: aching  Aggravating factors: sitting, standing , walking Relieving factors: ibuprofen   PRECAUTIONS: Other: glut repair   RED FLAGS: None   WEIGHT BEARING RESTRICTIONS: No  FALLS:  Has patient fallen in last 6 months? No  LIVING ENVIRONMENT: Flat into her house  OCCUPATION:  Retired   Hobbies:  Walking    PLOF: Independent  PATIENT GOALS:  To have less pain   NEXT MD VISIT: 10/21  OBJECTIVE:   DIAGNOSTIC FINDINGS:  Nothing postop  PATIENT SURVEYS:  FOTO    COGNITION: Overall cognitive status: Within functional limits for tasks assessed     SENSATION: Mild parathesias into the leg  EDEMA:  Nothing noticeable    POSTURE: No Significant postural limitations  PALPATION: Tenderness to palpation in gluteal  LOWER EXTREMITY ROM:  Passive ROM Right eval Left eval  Hip flexion    Hip extension    Hip abduction    Hip adduction    Hip internal rotation    Hip external rotation    Knee flexion    Knee extension    Ankle dorsiflexion    Ankle plantarflexion    Ankle inversion    Ankle eversion      (Blank rows = not tested)  LOWER EXTREMITY MMT:  MMT Right eval Left eval  Hip flexion 35 degrees with significant pain    Hip extension    Hip abduction    Hip adduction    Hip internal rotation Not tested 2nd to pain    Hip external rotation Not tested 2nd to pain    Knee flexion    Knee extension    Ankle dorsiflexion    Ankle plantarflexion    Ankle inversion    Ankle eversion     (Blank rows = not tested)   GAIT:   TODAY'S TREATMENT:                                                                                                                              Manual: PROM per protocol limit   Bridges 2x10  PPT 2x10 5" hold SAQ 2x10  Abductor squeeze 2x10  Hip flexor isometric in hooklying with small ball- 3" x10   LAQ 3# 5" hold x20L Standing HR/TR x20 Gait in hall 2x28ft no AD  Air-ex wgt shifts x19min ea ant/post and lateral Low march on airex x20 Nu-step L1 x23min (UE/LE)     10/3 -PROM L hip within protocol limits -Hooklying adductor squeeze 5" 2x10 -Bridges 2x10 -PPT-5" 2x10 LAQ 3#-3" 2x10 -Nu- step partial hip flexion/extension L2 x5 min -standing HR/TR x20      PATIENT EDUCATION:  Education details: HEP, symptom management, weightbearing status, Person educated: Patient Education method: Explanation, Demonstration, Tactile cues, Verbal cues, and Handouts Education comprehension: verbalized understanding, returned demonstration, verbal cues required, tactile cues required, and needs further education  HOME EXERCISE PROGRAM: Access Code: [email or web address] URL: [email or web address] Date: 05/11/2023 Prepared by: Lorayne Bender  Exercises - Supine Quad Set  - 1 x daily - 7 x weekly - 3 sets - 10 reps - Supine Heel Slide with Strap  - 1 x daily - 7 x weekly - 3 sets - 10 reps - Seated Ankle Pumps  - 1 x daily - 7 x weekly - 3 sets - 10 reps  ASSESSMENT:  CLINICAL IMPRESSION: Pt with lateral hip discomfort during bridging and PPT  today. Able to initiate gentle hip flexion isometric with  good tolerance. She does continue to demonstrate deviations in gait including decreased R step length and decreased L hip extension. Nu-step performed at end of session without pain. Will plan for d/c and HEP update next week as pt has transportation difficulty.   OBJECTIVE IMPAIRMENTS: Abnormal gait, decreased activity tolerance, decreased endurance, decreased mobility, difficulty walking, decreased ROM, decreased strength,  impaired perceived functional ability, and pain.   ACTIVITY LIMITATIONS: carrying, lifting, bending, sitting, standing, squatting, stairs, transfers, bed mobility, bathing, toileting, dressing, hygiene/grooming, and locomotion level  PARTICIPATION LIMITATIONS: meal prep, cleaning, laundry, driving, shopping, community activity, occupation, and yard work   PERSONAL FACTORS: 1-2 comorbidities: left total shoulder replacement   are also affecting patient's functional outcome.   REHAB POTENTIAL: Good  CLINICAL DECISION MAKING: Evolving/moderate complexity significant pain at this time   EVALUATION COMPLEXITY: Moderate   GOALS: Goals reviewed with patient? Yes  SHORT TERM GOALS: Target date: 07/10/2023     Patient will increase passive hip flexion to 90 degrees. Baseline: Goal status: 80 degrees today 10/8 2.  Patient will wean off of walker with progressive weightbearing without significant increase in pain Baseline:  Goal status: using a cane 10/8   3.  Patient will be independent with basic HEP Baseline:  Goal status: INITIAL Goal status: INITIAL  LONG TERM GOALS: Target date: 08/03/2023    Patient will ambulate community distances without pain Baseline:  Goal status: INITIAL  2.  Patient will go up and down 8 steps without pain in order to improve community access Baseline:  Goal status: INITIAL  3.  Patient will stand for greater than an hour without pain in order to perform ADLs Baseline:   Goal status: INITIAL  4.  Patient will return to walking for exercise. Baseline:  Goal status: INITIAL    PLAN:  PT FREQUENCY: 2x/week  PT DURATION: 12 weeks  PLANNED INTERVENTIONS: Therapeutic exercises, Therapeutic activity, Neuromuscular re-education, Balance training, Gait training, Patient/Family education, Self Care, Joint mobilization, Stair training, DME instructions, Aquatic Therapy, Dry Needling, Electrical stimulation, Cryotherapy, Moist heat, Taping, Manual therapy, and Re-evaluation.   PLAN FOR NEXT SESSION: Begin per gluteus medius repair protocol.  When allowed by Dr. Rickard Rhymes progressive weightbearing.   Donnel Saxon Brandyce Dimario, PTA 06/14/2023, 10:16 AM

## 2023-06-19 ENCOUNTER — Ambulatory Visit (HOSPITAL_BASED_OUTPATIENT_CLINIC_OR_DEPARTMENT_OTHER): Payer: 59 | Admitting: Physical Therapy

## 2023-06-19 ENCOUNTER — Encounter (HOSPITAL_BASED_OUTPATIENT_CLINIC_OR_DEPARTMENT_OTHER): Payer: Self-pay | Admitting: Physical Therapy

## 2023-06-19 DIAGNOSIS — R2689 Other abnormalities of gait and mobility: Secondary | ICD-10-CM

## 2023-06-19 DIAGNOSIS — M25552 Pain in left hip: Secondary | ICD-10-CM | POA: Diagnosis not present

## 2023-06-19 DIAGNOSIS — M25652 Stiffness of left hip, not elsewhere classified: Secondary | ICD-10-CM | POA: Diagnosis not present

## 2023-06-19 DIAGNOSIS — M6281 Muscle weakness (generalized): Secondary | ICD-10-CM | POA: Diagnosis not present

## 2023-06-19 NOTE — Therapy (Signed)
OUTPATIENT PHYSICAL THERAPY LOWER EXTREMITY TREATMENT   Patient Name: [name] MRN: [id number] DOB:1960-12-25, 62 y.o., female Today's Date: 06/19/2023  END OF SESSION:  PT End of Session - 06/19/23 0929     Visit Number 8    Number of Visits 24    Date for PT Re-Evaluation 08/03/23    Authorization Type Aet[employer]    PT Start Time 0930    PT Stop Time 1012    PT Time Calculation (min) 42 min    Activity Tolerance Patient tolerated treatment well    Behavior During Therapy [facility] for tasks assessed/performed                Past Medical History:  Diagnosis Date   Arthritis    rheumatoid per pt   Past Surgical History:  Procedure Laterality Date   ABDOMINAL HYSTERECTOMY  2001   TAH/BSO fibroids   CATARACT EXTRACTION Right    COLONOSCOPY  04/27/2011   GLUTEUS MINIMUS REPAIR Left 05/08/2023   Procedure: LEFT GLUTEUS MEDIUS  REPAIR  COLLAGEN PATCH;  Surgeon: Huel Cote, MD;  Location: [facility];  Service: Orthopedics;  Laterality: Left;   obstructed bile duct  2004   Dr Micki Riley   OOPHORECTOMY     BSO   REVERSE SHOULDER ARTHROPLASTY Left 02/14/2022   Procedure: LEFT REVERSE SHOULDER ARTHROPLASTY;  Surgeon: Huel Cote, MD;  [facility];  Service: Orthopedics;  Laterality: Left;   ROTATOR CUFF REPAIR Bilateral 12/2010   Left and right   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   Upper respiratory tract infection 04/06/2023   Sore throat 04/06/2023   COVID-19 09/28/2022   Right calf pain 09/28/2022   Acute cough 09/28/2022   Rotator cuff arthropathy of left shoulder    Preop examination 12/30/2021   Arthritis of carpometacarpal [facility] joint of left thumb 12/02/2021   Lumbar radiculopathy 05/29/2017   Greater trochanteric bursitis of left hip 05/29/2017   Contusion, hip and thigh, left, initial encounter 05/29/2017   Left hip pain 04/30/2017   Abdominal pain, LLQ 04/30/2017   Back  pain 02/16/2015   Obesity (BMI 30-39.9) 06/09/2013   OSTEOARTHRITIS 12/15/2009   IMPAIRMENT, ONE EYE MODERATE, OTHER NOS 07/01/2007   SBO 07/01/2007   HERNIA, UMBILICAL 06/11/2007    PCP: Dr Loreen Freud   REFERRING PROVIDER: Dr Huel Cote   REFERRING DIAG:  Diagnosis  M67.959 (ICD-10-CM) - Tendinopathy of gluteus medius    THERAPY DIAG:  Muscle weakness (generalized)  Stiffness of left hip, not elsewhere classified  Pain in left hip  Other abnormalities of gait and mobility  Rationale for Evaluation and Treatment: Rehabilitation  ONSET DATE: 05/08/2023 glut med repair   SUBJECTIVE:   SUBJECTIVE STATEMENT: The patient hasn't been using her cane. She has been doing well. She hopes to go back to the [place] center soon.   PERTINENT HISTORY: Arthritis of left thumb , Left TSA, Back Pain  PAIN:  Are you having pain? No: NPRS scale: 0 /10 currently  Pain location: Left lateral gluteal and across low back Pain description: aching  Aggravating factors: sitting, standing , walking Relieving factors: ibuprofen   PRECAUTIONS: Other: glut repair   RED FLAGS: None   WEIGHT BEARING RESTRICTIONS: No  FALLS:  Has patient fallen in last 6 months? No  LIVING ENVIRONMENT: Flat into her house  OCCUPATION:  Retired   Hobbies:  Walking    PLOF: Independent  PATIENT GOALS:  To have less pain   NEXT MD VISIT: 10/21  OBJECTIVE:   DIAGNOSTIC FINDINGS: Nothing postop  PATIENT SURVEYS:  FOTO    COGNITION: Overall cognitive status: Within functional limits for tasks assessed     SENSATION: Mild parathesias into the leg  EDEMA:  Nothing noticeable    POSTURE: No Significant postural limitations  PALPATION: Tenderness to palpation in gluteal  LOWER EXTREMITY ROM:  Passive ROM Right eval Left eval  Hip flexion    Hip extension    Hip abduction    Hip adduction    Hip internal rotation    Hip external rotation    Knee flexion    Knee extension     Ankle dorsiflexion    Ankle plantarflexion    Ankle inversion    Ankle eversion     (Blank rows = not tested)  LOWER EXTREMITY MMT:  MMT Right eval Left eval  Hip flexion 35 degrees with significant pain    Hip extension    Hip abduction    Hip adduction    Hip internal rotation Not tested 2nd to pain    Hip external rotation Not tested 2nd to pain    Knee flexion    Knee extension    Ankle dorsiflexion    Ankle plantarflexion    Ankle inversion    Ankle eversion     (Blank rows = not tested)   GAIT:   TODAY'S TREATMENT:                                                                                                                              10/15  Manual: PROM per protocol limit   Bridges 2x10  PPT 2x10 5" hold SAQ 2x10  Abductor squeeze 2x10  Hip flexor isometric in hooklying with small ball- 3" x10   LAQ 3# 5" hold x20L Standing HR/TR x20 Standing slow march 2x10     Last visit:   Manual: PROM per protocol limit   Bridges 2x10  PPT 2x10 5" hold SAQ 2x10  Abductor squeeze 2x10  Hip flexor isometric in hooklying with small ball- 3" x10   LAQ 3# 5" hold x20L Standing HR/TR x20 Gait in hall 2x51ft no AD  Air-ex wgt shifts x58min ea ant/post and lateral Low march on airex x20 Nu-step L1 x29min (UE/LE)     10/3 -PROM L hip within protocol limits -Hooklying adductor squeeze 5" 2x10 -Bridges 2x10 -PPT-5" 2x10 LAQ 3#-3" 2x10 -Nu- step partial hip flexion/extension L2 x5 min -standing HR/TR x20      PATIENT EDUCATION:  Education details: HEP, symptom management, weightbearing status, Person educated: Patient Education method: Explanation, Demonstration, Tactile cues, Verbal cues, and Handouts Education comprehension: verbalized understanding, returned demonstration, verbal cues required, tactile cues required, and needs further education  HOME EXERCISE PROGRAM: Access Code: [email or web address] URL: [email or web address] Date:  05/11/2023 Prepared by: Lorayne Bender  Exercises - Supine Quad Set  - 1 x daily - 7  x weekly - 3 sets - 10 reps - Supine Heel Slide with Strap  - 1 x daily - 7 x weekly - 3 sets - 10 reps - Seated Ankle Pumps  - 1 x daily - 7 x weekly - 3 sets - 10 reps  ASSESSMENT:  CLINICAL IMPRESSION: The patient continues to have some intermittent pain at times. We reviewed hip abduction with a light band today. She had mild pain in the last set. We did not give for home yet. We also reviewed standing marching. She required UE support in standing with march. We will review her HEP with potential D/C next visit. We reviewed things to do at the [facility] center.  OBJECTIVE IMPAIRMENTS: Abnormal gait, decreased activity tolerance, decreased endurance, decreased mobility, difficulty walking, decreased ROM, decreased strength,  impaired perceived functional ability, and pain.   ACTIVITY LIMITATIONS: carrying, lifting, bending, sitting, standing, squatting, stairs, transfers, bed mobility, bathing, toileting, dressing, hygiene/grooming, and locomotion level  PARTICIPATION LIMITATIONS: meal prep, cleaning, laundry, driving, shopping, community activity, occupation, and yard work   PERSONAL FACTORS: 1-2 comorbidities: left total shoulder replacement   are also affecting patient's functional outcome.   REHAB POTENTIAL: Good  CLINICAL DECISION MAKING: Evolving/moderate complexity significant pain at this time   EVALUATION COMPLEXITY: Moderate   GOALS: Goals reviewed with patient? Yes  SHORT TERM GOALS: Target date: 07/10/2023     Patient will increase passive hip flexion to 90 degrees. Baseline: Goal status: 80 degrees today 10/8 2.  Patient will wean off of walker with progressive weightbearing without significant increase in pain Baseline:  Goal status: using a cane 10/8   3.  Patient will be independent with basic HEP Baseline:  Goal status: INITIAL Goal status: INITIAL  LONG TERM GOALS: Target  date: 08/03/2023    Patient will ambulate community distances without pain Baseline:  Goal status: INITIAL  2.  Patient will go up and down 8 steps without pain in order to improve community access Baseline:  Goal status: INITIAL  3.  Patient will stand for greater than an hour without pain in order to perform ADLs Baseline:  Goal status: INITIAL  4.  Patient will return to walking for exercise. Baseline:  Goal status: INITIAL    PLAN:  PT FREQUENCY: 2x/week  PT DURATION: 12 weeks  PLANNED INTERVENTIONS: Therapeutic exercises, Therapeutic activity, Neuromuscular re-education, Balance training, Gait training, Patient/Family education, Self Care, Joint mobilization, Stair training, DME instructions, Aquatic Therapy, Dry Needling, Electrical stimulation, Cryotherapy, Moist heat, Taping, Manual therapy, and Re-evaluation.   PLAN FOR NEXT SESSION: Begin per gluteus medius repair protocol.  When allowed by Dr. Rickard Rhymes progressive weightbearing.   Dessie Coma, PT 06/19/2023, 9:41 AM

## 2023-06-21 ENCOUNTER — Encounter (HOSPITAL_BASED_OUTPATIENT_CLINIC_OR_DEPARTMENT_OTHER): Payer: Self-pay

## 2023-06-21 ENCOUNTER — Ambulatory Visit (HOSPITAL_BASED_OUTPATIENT_CLINIC_OR_DEPARTMENT_OTHER): Payer: 59

## 2023-06-21 DIAGNOSIS — M25652 Stiffness of left hip, not elsewhere classified: Secondary | ICD-10-CM

## 2023-06-21 DIAGNOSIS — M6281 Muscle weakness (generalized): Secondary | ICD-10-CM

## 2023-06-21 DIAGNOSIS — M25552 Pain in left hip: Secondary | ICD-10-CM

## 2023-06-21 DIAGNOSIS — R2689 Other abnormalities of gait and mobility: Secondary | ICD-10-CM | POA: Diagnosis not present

## 2023-06-21 NOTE — Therapy (Addendum)
 OUTPATIENT PHYSICAL THERAPY LOWER EXTREMITY TREATMENT PHYSICAL THERAPY DISCHARGE SUMMARY  Visits from Start of Care: 9  Current functional level related to goals / functional outcomes: indep   Remaining deficits: weakness   Education / Equipment: Management of condition/HEP   Patient agrees to discharge. Patient goals were partially met. Patient is being discharged due to  lack of transportation. Addend Corrie Dandy [facility]) Ziemba MPT 10/25/23 1:30 PM [facility] [address] [place], [place], [address] Phone: [phone]   Fax:  [phone]    Patient Name: [name] MRN: [id number] DOB:1961/05/29, 62 y.o., female Today's Date: 06/21/2023  END OF SESSION:  PT End of Session - 06/21/23 0939     Visit Number 9    Number of Visits 24    Date for PT Re-Evaluation 08/03/23    Authorization Type Aetna    PT Start Time [phone]    PT Stop Time 1018    PT Time Calculation (min) 40 min    Activity Tolerance Patient tolerated treatment well    Behavior During Therapy [facility] for tasks assessed/performed                Past Medical History:  Diagnosis Date   Arthritis    rheumatoid per pt   Past Surgical History:  Procedure Laterality Date   ABDOMINAL HYSTERECTOMY  2001   TAH/BSO fibroids   CATARACT EXTRACTION Right    COLONOSCOPY  04/27/2011   GLUTEUS MINIMUS REPAIR Left 05/08/2023   Procedure: LEFT GLUTEUS MEDIUS  REPAIR  COLLAGEN PATCH;  Surgeon: Huel Cote, MD;  Location: [facility];  Service: Orthopedics;  Laterality: Left;   obstructed bile duct  2004   Dr Micki Riley   OOPHORECTOMY     BSO   REVERSE SHOULDER ARTHROPLASTY Left 02/14/2022   Procedure: LEFT REVERSE SHOULDER ARTHROPLASTY;  Surgeon: Huel Cote, MD;  [facility];  Service: Orthopedics;  Laterality: Left;   ROTATOR CUFF REPAIR Bilateral 12/2010   Left and right   Patient Active Problem List   Diagnosis Date Noted    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   Upper respiratory tract infection 04/06/2023   Sore throat 04/06/2023   COVID-19 09/28/2022   Right calf pain 09/28/2022   Acute cough 09/28/2022   Rotator cuff arthropathy of left shoulder    Preop examination 12/30/2021   Arthritis of carpometacarpal [facility]) joint of left thumb 12/02/2021   Lumbar radiculopathy 05/29/2017   Greater trochanteric bursitis of left hip 05/29/2017   Contusion, hip and thigh, left, initial encounter 05/29/2017   Left hip pain 04/30/2017   Abdominal pain, LLQ 04/30/2017   Back pain 02/16/2015   Obesity (BMI 30-39.9) 06/09/2013   OSTEOARTHRITIS 12/15/2009   IMPAIRMENT, ONE EYE MODERATE, OTHER NOS 07/01/2007   SBO 07/01/2007   HERNIA, UMBILICAL 06/11/2007    PCP: Dr Loreen Freud   REFERRING PROVIDER: Dr Huel Cote   REFERRING DIAG:  Diagnosis  M67.959 (ICD-10-CM) - Tendinopathy of gluteus medius    THERAPY DIAG:  Muscle weakness (generalized)  Stiffness of left hip, not elsewhere classified  Pain in left hip  Rationale for Evaluation and Treatment: Rehabilitation  ONSET DATE: 05/08/2023 glut med repair   SUBJECTIVE:   SUBJECTIVE STATEMENT: The patient hasn't been using her cane. She has been doing well. She hopes to go back to the [place] center soon.   PERTINENT HISTORY: Arthritis of left thumb , Left TSA, Back Pain  PAIN:  Are you having pain? No: NPRS scale:  0 /10 currently  Pain location: Left lateral gluteal and across low back Pain description: aching  Aggravating factors: sitting, standing , walking Relieving factors: ibuprofen   PRECAUTIONS: Other: glut repair   RED FLAGS: None   WEIGHT BEARING RESTRICTIONS: No  FALLS:  Has patient fallen in last 6 months? No  LIVING ENVIRONMENT: Flat into her house  OCCUPATION:  Retired   Presenter, broadcasting:  Walking    PLOF: Independent  PATIENT GOALS:  To have less pain   NEXT MD VISIT: 10/21  OBJECTIVE:   DIAGNOSTIC FINDINGS:  Nothing postop  PATIENT SURVEYS:  FOTO   78% on 10/17 COGNITION: Overall cognitive status: Within functional limits for tasks assessed     SENSATION: Mild parathesias into the leg  EDEMA:  Nothing noticeable    POSTURE: No Significant postural limitations  PALPATION: Tenderness to palpation in gluteal  LOWER EXTREMITY ROM:  Passive ROM Right eval Left eval Left 10/17  Hip flexion   120  Hip extension     Hip abduction   15  Hip adduction     Hip internal rotation   12  Hip external rotation   20  Knee flexion     Knee extension     Ankle dorsiflexion     Ankle plantarflexion     Ankle inversion     Ankle eversion      (Blank rows = not tested)  LOWER EXTREMITY MMT:  MMT Right eval Left eval  Hip flexion 35 degrees with significant pain    Hip extension    Hip abduction    Hip adduction    Hip internal rotation Not tested 2nd to pain    Hip external rotation Not tested 2nd to pain    Knee flexion    Knee extension    Ankle dorsiflexion    Ankle plantarflexion    Ankle inversion    Ankle eversion     (Blank rows = not tested)   GAIT:   TODAY'S TREATMENT:                                                                                                                               10/17  PROM Updated ROM Updated goals FOTO-78% HEP update and review -hooklying add sqz 3" 2x10 -supine march 2x10 -bridges 2x10    10/15  Manual: PROM per protocol limit   Bridges 2x10  PPT 2x10 5" hold SAQ 2x10  Abductor squeeze 2x10  Hip flexor isometric in hooklying with small ball- 3" x10   LAQ 3# 5" hold x20L Standing HR/TR x20 Standing slow march 2x10       PATIENT EDUCATION:  Education details: HEP, symptom management, weightbearing status, Person educated: Patient Education method: Explanation, Demonstration, Tactile cues, Verbal cues, and Handouts Education comprehension: verbalized understanding, returned demonstration, verbal cues  required, tactile cues required, and needs further education  HOME EXERCISE PROGRAM: Access Code: [email or web address] URL: [email or web address] Date: 05/11/2023 Prepared by:  Lorayne Bender    ASSESSMENT:  CLINICAL IMPRESSION: Pt will be self d/c today due to transportation issues. Spent time with pt reviewing protocol and timeline for exercise progressions. Updated HEP with instructions for progressions at 8 and 10 weeks. Pt has met all STG and no LTG as pt is only 6 weeks s/p at this time. Updated measurements show improved hip flexion, though limitation with abd, IR, and ER.  Pt has f/u with MD on 10/21.    OBJECTIVE IMPAIRMENTS: Abnormal gait, decreased activity tolerance, decreased endurance, decreased mobility, difficulty walking, decreased ROM, decreased strength,  impaired perceived functional ability, and pain.   ACTIVITY LIMITATIONS: carrying, lifting, bending, sitting, standing, squatting, stairs, transfers, bed mobility, bathing, toileting, dressing, hygiene/grooming, and locomotion level  PARTICIPATION LIMITATIONS: meal prep, cleaning, laundry, driving, shopping, community activity, occupation, and yard work   PERSONAL FACTORS: 1-2 comorbidities: left total shoulder replacement   are also affecting patient's functional outcome.   REHAB POTENTIAL: Good  CLINICAL DECISION MAKING: Evolving/moderate complexity significant pain at this time   EVALUATION COMPLEXITY: Moderate   GOALS: Goals reviewed with patient? Yes  SHORT TERM GOALS: Target date: 07/10/2023     Patient will increase passive hip flexion to 90 degrees. Baseline: Goal status: 120degrees today 10/17 2.  Patient will wean off of walker with progressive weightbearing without significant increase in pain Baseline:  Goal status: MET-No AD unless out in public [facility]) 10/17  3.  Patient will be independent with basic HEP Baseline:  Goal status: MET (10/17)   LONG TERM GOALS: Target date:  08/03/2023    Patient will ambulate community distances without pain Baseline:  Goal status: IN PROGRESS (10/17)  2.  Patient will go up and down 8 steps without pain in order to improve community access Baseline:  Goal status: INITIAL  3.  Patient will stand for greater than an hour without pain in order to perform ADLs Baseline:  Goal status: INITIAL  4.  Patient will return to walking for exercise. Baseline:  Goal status: INITIAL    PLAN:  PT FREQUENCY: 2x/week  PT DURATION: 12 weeks  PLANNED INTERVENTIONS: Therapeutic exercises, Therapeutic activity, Neuromuscular re-education, Balance training, Gait training, Patient/Family education, Self Care, Joint mobilization, Stair training, DME instructions, Aquatic Therapy, Dry Needling, Electrical stimulation, Cryotherapy, Moist heat, Taping, Manual therapy, and Re-evaluation.   PLAN FOR NEXT SESSION: Begin per gluteus medius repair protocol.  When allowed by Dr. Rickard Rhymes progressive weightbearing.    Therapist reviewed DC with PTA and agrees with plan going forward.   [address] [place]) Ziemba MPT 10/25/23 1:29 PM [facility] [address] [place], [place], [address] Phone: [phone]   Fax:  [phone]

## 2023-06-25 ENCOUNTER — Ambulatory Visit (INDEPENDENT_AMBULATORY_CARE_PROVIDER_SITE_OTHER): Payer: 59 | Admitting: Orthopaedic Surgery

## 2023-06-25 DIAGNOSIS — M67959 Unspecified disorder of synovium and tendon, unspecified thigh: Secondary | ICD-10-CM

## 2023-06-25 NOTE — Progress Notes (Signed)
Post Operative Evaluation    Procedure/Date of Surgery: Status post left gluteus medius repair 9/3  Interval History:   Presents today 6 weeks status post left hip gluteus medius repair.  Overall she is doing very well.  Has no pain at today's visit.  She is continue to work on strengthening of the left hip.   PMH/PSH/Family History/Social History/Meds/Allergies:    Past Medical History:  Diagnosis Date   Arthritis    rheumatoid per pt   Past Surgical History:  Procedure Laterality Date   ABDOMINAL HYSTERECTOMY  2001   TAH/BSO fibroids   CATARACT EXTRACTION Right    COLONOSCOPY  04/27/2011   GLUTEUS MINIMUS REPAIR Left 05/08/2023   Procedure: LEFT GLUTEUS MEDIUS  REPAIR  COLLAGEN PATCH;  Surgeon: Huel Cote, MD;  Location:  SURGERY CENTER;  Service: Orthopedics;  Laterality: Left;   obstructed bile duct  2004   Dr Micki Riley   OOPHORECTOMY     BSO   REVERSE SHOULDER ARTHROPLASTY Left 02/14/2022   Procedure: LEFT REVERSE SHOULDER ARTHROPLASTY;  Surgeon: Huel Cote, MD;  Location: [facility];  Service: Orthopedics;  Laterality: Left;   ROTATOR CUFF REPAIR Bilateral 12/2010   Left and right   Social History   Socioeconomic History   Marital status: Single    Spouse name: Not on file   Number of children: 1   Years of education: Not on file   Highest education level: Not on file  Occupational History   Occupation: MEAT CUTTER    Employer: [employer]  Tobacco Use   Smoking status: Never   Smokeless tobacco: Never  Vaping Use   Vaping status: Never Used  Substance and Sexual Activity   Alcohol use: Yes    Alcohol/week: 0.0 standard drinks of alcohol    Comment: Occas. wine   Drug use: No   Sexual activity: Not Currently    Partners: Male    Birth control/protection: Surgical    Comment: 1st intercourse 62 yo-More than 5 partners,hysterectomy  Other Topics Concern   Not on file  Social History Narrative    Exercise-- water aerobics   Social Determinants of Health   Financial Resource Strain: Not on file  Food Insecurity: Not on file  Transportation Needs: Not on file  Physical Activity: Not on file  Stress: Not on file  Social Connections: Not on file   Family History  Problem Relation Age of Onset   Heart disease Mother 45       MI   Stroke Mother    Hypertension Mother    Arthritis Mother    Pulmonary embolism Mother    Prostate cancer Father    Hypertension Father    Arthritis Sister    Colon cancer Neg Hx    Colon polyps Neg Hx    Esophageal cancer Neg Hx    Rectal cancer Neg Hx    Stomach cancer Neg Hx    Breast cancer Neg Hx    No Known Allergies Current Outpatient Medications  Medication Sig Dispense Refill   aspirin EC 325 MG tablet Take 1 tablet (325 mg total) by mouth daily. 30 tablet 0   azelastine (ASTELIN) 0.1 % nasal spray Place 1 spray into both nostrils 2 (two) times daily. Use in each nostril as directed (Patient not taking: Reported on  05/11/2023) 30 mL 12   Cholecalciferol (VITAMIN D PO) Take 1 tablet by mouth 2 (two) times a week.     cyclobenzaprine (FLEXERIL) 5 MG tablet Take 1 tablet (5 mg total) by mouth at bedtime. (Patient taking differently: Take 5 mg by mouth daily as needed for muscle spasms.) 30 tablet 0   levocetirizine (XYZAL) 5 MG tablet Take 1 tablet (5 mg total) by mouth every evening. 30 tablet 5   LORazepam (ATIVAN) 1 MG tablet Take 1 tablet (1 mg total) by mouth every 8 (eight) hours. 2 tablet 0   oxyCODONE (ROXICODONE) 5 MG immediate release tablet Take 1 tablet (5 mg total) by mouth every 4 (four) hours as needed for severe pain or breakthrough pain. 15 tablet 0   traMADol (ULTRAM) 50 MG tablet Take 1 tablet (50 mg total) by mouth every 6 (six) hours as needed. 30 tablet 2   traMADol (ULTRAM) 50 MG tablet Take 1 tablet (50 mg total) by mouth every 6 (six) hours as needed. 30 tablet 2   Vitamin D, Ergocalciferol, (DRISDOL) 1.25 MG (50000  UNIT) CAPS capsule Take 1 capsule (50,000 Units total) by mouth every 7 (seven) days. 12 capsule 1   No current facility-administered medications for this visit.   No results found.  Review of Systems:   A ROS was performed including pertinent positives and negatives as documented in the HPI.   Musculoskeletal Exam:    There were no vitals taken for this visit.  Incision is well-appearing without erythema or drainage.  Internal extra rotation is 30 degrees with active flexion to 90 degrees of the hip.  Some weakness with resisted hip abduction distal neurovascular exam is intact  Imaging:      I personally reviewed and interpreted the radiographs.   Assessment:   6 weeks status post left hip gluteus medius repair overall doing extremely well.  At this time she will advance her weightbearing to as tolerated per the gluteus protocol.  I will plan to see her back in 6 weeks for final check  Plan :    -Return to clinic 6 weeks      I personally saw and evaluated the patient, and participated in the management and treatment plan.  Huel Cote, MD Attending Physician, Orthopedic Surgery  This document was dictated using Dragon voice recognition software. A reasonable attempt at proof reading has been made to minimize errors.

## 2023-06-26 ENCOUNTER — Ambulatory Visit: Payer: 59 | Admitting: Gastroenterology

## 2023-07-24 DIAGNOSIS — N3281 Overactive bladder: Secondary | ICD-10-CM | POA: Diagnosis not present

## 2023-07-24 DIAGNOSIS — N3941 Urge incontinence: Secondary | ICD-10-CM | POA: Diagnosis not present

## 2023-08-05 DIAGNOSIS — Z419 Encounter for procedure for purposes other than remedying health state, unspecified: Secondary | ICD-10-CM | POA: Diagnosis not present

## 2023-08-08 ENCOUNTER — Ambulatory Visit (INDEPENDENT_AMBULATORY_CARE_PROVIDER_SITE_OTHER): Payer: 59 | Admitting: Orthopaedic Surgery

## 2023-08-08 DIAGNOSIS — M25552 Pain in left hip: Secondary | ICD-10-CM | POA: Diagnosis not present

## 2023-08-08 MED ORDER — TRIAMCINOLONE ACETONIDE 40 MG/ML IJ SUSP
80.0000 mg | INTRAMUSCULAR | Status: AC | PRN
Start: 2023-08-08 — End: 2023-08-08
  Administered 2023-08-08: 80 mg via INTRA_ARTICULAR

## 2023-08-08 MED ORDER — LIDOCAINE HCL 1 % IJ SOLN
4.0000 mL | INTRAMUSCULAR | Status: AC | PRN
Start: 2023-08-08 — End: 2023-08-08
  Administered 2023-08-08: 4 mL

## 2023-08-08 NOTE — Progress Notes (Signed)
Post Operative Evaluation    Procedure/Date of Surgery: Status post left gluteus medius repair 9/3  Interval History:   Presents today for follow-up as she is having more groin based left hip pain.  She is experiencing a clicking or popping in this particular the groin.  Denies any lateral based pain.  She was progressing quite nicely with weightbearing till recently when the groin started flaring up  PMH/PSH/Family History/Social History/Meds/Allergies:    Past Medical History:  Diagnosis Date  . Arthritis    rheumatoid per pt   Past Surgical History:  Procedure Laterality Date  . ABDOMINAL HYSTERECTOMY  2001   TAH/BSO fibroids  . CATARACT EXTRACTION Right   . COLONOSCOPY  04/27/2011  . GLUTEUS MINIMUS REPAIR Left 05/08/2023   Procedure: LEFT GLUTEUS MEDIUS  REPAIR  COLLAGEN PATCH;  Surgeon: Huel Cote, MD;  Location:  [facility];  Service: Orthopedics;  Laterality: Left;  . obstructed bile duct  2004   Dr Micki Riley  . OOPHORECTOMY     BSO  . REVERSE SHOULDER ARTHROPLASTY Left 02/14/2022   Procedure: LEFT REVERSE SHOULDER ARTHROPLASTY;  Surgeon: Huel Cote, MD;  Location: [facility];  Service: Orthopedics;  Laterality: Left;  . ROTATOR CUFF REPAIR Bilateral 12/2010   Left and right   Social History   Socioeconomic History  . Marital status: Single    Spouse name: Not on file  . Number of children: 1  . Years of education: Not on file  . Highest education level: Not on file  Occupational History  . Occupation: MEAT CUTTER    Employer: [employer]  Tobacco Use  . Smoking status: Never  . Smokeless tobacco: Never  Vaping Use  . Vaping status: Never Used  Substance and Sexual Activity  . Alcohol use: Yes    Alcohol/week: 0.0 standard drinks of alcohol    Comment: Occas. wine  . Drug use: No  . Sexual activity: Not Currently    Partners: Male    Birth control/protection: Surgical    Comment: 1st intercourse 62  yo-More than 5 partners,hysterectomy  Other Topics Concern  . Not on file  Social History Narrative   Exercise-- water aerobics   Social Determinants of Health   Financial Resource Strain: Not on file  Food Insecurity: Not on file  Transportation Needs: Not on file  Physical Activity: Not on file  Stress: Not on file  Social Connections: Not on file   Family History  Problem Relation Age of Onset  . Heart disease Mother 89       MI  . Stroke Mother   . Hypertension Mother   . Arthritis Mother   . Pulmonary embolism Mother   . Prostate cancer Father   . Hypertension Father   . Arthritis Sister   . Colon cancer Neg Hx   . Colon polyps Neg Hx   . Esophageal cancer Neg Hx   . Rectal cancer Neg Hx   . Stomach cancer Neg Hx   . Breast cancer Neg Hx    No Known Allergies Current Outpatient Medications  Medication Sig Dispense Refill  . aspirin EC 325 MG tablet Take 1 tablet (325 mg total) by mouth daily. 30 tablet 0  . azelastine (ASTELIN) 0.1 % nasal spray Place 1 spray into both nostrils 2 (two) times daily.  Use in each nostril as directed (Patient not taking: Reported on 05/11/2023) 30 mL 12  . Cholecalciferol (VITAMIN D PO) Take 1 tablet by mouth 2 (two) times a week.    . cyclobenzaprine (FLEXERIL) 5 MG tablet Take 1 tablet (5 mg total) by mouth at bedtime. (Patient taking differently: Take 5 mg by mouth daily as needed for muscle spasms.) 30 tablet 0  . levocetirizine (XYZAL) 5 MG tablet Take 1 tablet (5 mg total) by mouth every evening. 30 tablet 5  . LORazepam (ATIVAN) 1 MG tablet Take 1 tablet (1 mg total) by mouth every 8 (eight) hours. 2 tablet 0  . oxyCODONE (ROXICODONE) 5 MG immediate release tablet Take 1 tablet (5 mg total) by mouth every 4 (four) hours as needed for severe pain or breakthrough pain. 15 tablet 0  . traMADol (ULTRAM) 50 MG tablet Take 1 tablet (50 mg total) by mouth every 6 (six) hours as needed. 30 tablet 2  . traMADol (ULTRAM) 50 MG tablet Take 1  tablet (50 mg total) by mouth every 6 (six) hours as needed. 30 tablet 2  . Vitamin D, Ergocalciferol, (DRISDOL) 1.25 MG (50000 UNIT) CAPS capsule Take 1 capsule (50,000 Units total) by mouth every 7 (seven) days. 12 capsule 1   No current facility-administered medications for this visit.   No results found.  Review of Systems:   A ROS was performed including pertinent positives and negatives as documented in the HPI.   Musculoskeletal Exam:    There were no vitals taken for this visit.  Incision is well-appearing without erythema or drainage.  Internal extra rotation is 30 degrees with active flexion to 90 degrees of the hip.  Some weakness with resisted hip abduction distal neurovascular exam is intact  Imaging:      I personally reviewed and interpreted the radiographs.   Assessment:   12 weeks status post left hip gluteus medius repair overall doing extremely well.  At this point she is having more symptoms in the groin consistent with iliopsoas impingement.  I did discuss that this is common following gluteus medius repair and I did recommend an ultrasound-guided injection into the hip to hopefully get her some relief.  Will plan to proceed with this today.  Plan :    -Left hip ultrasound-guided injection  Verbal consent obtained    Procedure Note  Patient: [name]             Date of Birth: 03-Feb-1961           MRN: [id number]             Visit Date: 08/08/2023  Procedures: Visit Diagnoses: No diagnosis found.  Large Joint Inj: L hip joint on 08/08/2023 12:43 PM Indications: pain Details: 22 G 3.5 in needle, ultrasound-guided anterolateral approach  Arthrogram: No  Medications: 4 mL lidocaine 1 %; 80 mg triamcinolone acetonide 40 MG/ML Outcome: tolerated well, no immediate complications Procedure, treatment alternatives, risks and benefits explained, specific risks discussed. Consent was given by the patient. Immediately prior to procedure a time out was  called to verify the correct patient, procedure, equipment, support staff and site/side marked as required. Patient was prepped and draped in the usual sterile fashion.           I personally saw and evaluated the patient, and participated in the management and treatment plan.  Huel Cote, MD Attending Physician, Orthopedic Surgery  This document was dictated using Dragon voice recognition software. A reasonable attempt  at proof reading has been made to minimize errors.

## 2023-09-05 DIAGNOSIS — Z419 Encounter for procedure for purposes other than remedying health state, unspecified: Secondary | ICD-10-CM | POA: Diagnosis not present

## 2023-10-06 DIAGNOSIS — Z419 Encounter for procedure for purposes other than remedying health state, unspecified: Secondary | ICD-10-CM | POA: Diagnosis not present

## 2023-10-30 ENCOUNTER — Ambulatory Visit: Payer: Self-pay | Admitting: Family Medicine

## 2023-10-30 ENCOUNTER — Encounter: Payer: Self-pay | Admitting: Family Medicine

## 2023-10-30 ENCOUNTER — Ambulatory Visit (INDEPENDENT_AMBULATORY_CARE_PROVIDER_SITE_OTHER): Payer: Medicaid Other | Admitting: Family Medicine

## 2023-10-30 VITALS — BP 130/90 | HR 75 | Temp 98.8°F | Resp 18 | Ht 65.0 in | Wt 196.0 lb

## 2023-10-30 DIAGNOSIS — J0140 Acute pansinusitis, unspecified: Secondary | ICD-10-CM

## 2023-10-30 DIAGNOSIS — R6889 Other general symptoms and signs: Secondary | ICD-10-CM

## 2023-10-30 LAB — POC COVID19 BINAXNOW: SARS Coronavirus 2 Ag: NEGATIVE

## 2023-10-30 LAB — POC INFLUENZA A&B (BINAX/QUICKVUE)
Influenza A, POC: NEGATIVE
Influenza B, POC: NEGATIVE

## 2023-10-30 MED ORDER — OSELTAMIVIR PHOSPHATE 75 MG PO CAPS
75.0000 mg | ORAL_CAPSULE | Freq: Two times a day (BID) | ORAL | 0 refills | Status: DC
Start: 2023-10-30 — End: 2024-01-22

## 2023-10-30 MED ORDER — AMOXICILLIN-POT CLAVULANATE 875-125 MG PO TABS
1.0000 | ORAL_TABLET | Freq: Two times a day (BID) | ORAL | 0 refills | Status: DC
Start: 2023-10-30 — End: 2024-01-22

## 2023-10-30 MED ORDER — PROMETHAZINE-DM 6.25-15 MG/5ML PO SYRP
5.0000 mL | ORAL_SOLUTION | Freq: Four times a day (QID) | ORAL | 0 refills | Status: DC | PRN
Start: 2023-10-30 — End: 2024-01-22

## 2023-10-30 NOTE — Progress Notes (Signed)
 Established Patient Office Visit  Subjective   Patient ID: [name], female    DOB: July 07, 1961  Age: 63 y.o. MRN: [id number]  Chief Complaint  Patient presents with   Cough    Sxs started Sunday, dry cough, body aches, diarrhea this morning, headache and neck pain     HPI Discussed the use of AI scribe software for clinical note transcription with the patient, who gave verbal consent to proceed.  History of Present Illness   [name] is a 63 year old female who presents with severe headache, sore throat, and body aches.  She has been experiencing a severe headache, sore throat, and body aches since Sunday, following a visit from her brother who had a severe cough. The headache is described as feeling like it is 'about to bust wide open', and her throat feels 'scrunchy'.  No fever but she has chills. She experienced diarrhea for the first time on the morning of the visit and had stomach pain the previous night. She has a frequent cough without sputum production.  She took Tylenol earlier in the day to manage her symptoms but has not taken any medication specifically for the cough. She has Astelin and Xyzal nasal spray at home, which she has used previously for sinus issues.  Her brother, who had similar symptoms, did not seek medical attention but felt better after taking some medicine.      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   Upper respiratory tract infection 04/06/2023   Sore throat 04/06/2023   COVID-19 09/28/2022   Right calf pain 09/28/2022   Acute cough 09/28/2022   Rotator cuff arthropathy of left shoulder    Preop examination 12/30/2021   Arthritis of carpometacarpal [facility]) joint of left thumb 12/02/2021   Lumbar radiculopathy 05/29/2017   Greater trochanteric bursitis of left hip 05/29/2017   Contusion, hip and thigh, left, initial encounter 05/29/2017   Left hip pain 04/30/2017   Abdominal  pain, LLQ 04/30/2017   Back pain 02/16/2015   Obesity (BMI 30-39.9) 06/09/2013   OSTEOARTHRITIS 12/15/2009   IMPAIRMENT, ONE EYE MODERATE, OTHER NOS 07/01/2007   SBO 07/01/2007   HERNIA, UMBILICAL 06/11/2007   Past Medical History:  Diagnosis Date   Arthritis    rheumatoid per pt   Past Surgical History:  Procedure Laterality Date   ABDOMINAL HYSTERECTOMY  2001   TAH/BSO fibroids   CATARACT EXTRACTION Right    COLONOSCOPY  04/27/2011   GLUTEUS MINIMUS REPAIR Left 05/08/2023   Procedure: LEFT GLUTEUS MEDIUS  REPAIR  COLLAGEN PATCH;  Surgeon: Huel Cote, MD;  Location: [facility];  Service: Orthopedics;  Laterality: Left;   obstructed bile duct  2004   Dr Micki Riley   OOPHORECTOMY     BSO   REVERSE SHOULDER ARTHROPLASTY Left 02/14/2022   Procedure: LEFT REVERSE SHOULDER ARTHROPLASTY;  Surgeon: Huel Cote, MD;  Location: [facility] OR;  Service: Orthopedics;  Laterality: Left;   ROTATOR CUFF REPAIR Bilateral 12/2010   Left and right   Social History   Tobacco Use   Smoking status: Never   Smokeless tobacco: Never  Vaping Use   Vaping status: Never Used  Substance Use Topics   Alcohol use: Yes    Alcohol/week: 0.0 standard drinks of alcohol    Comment: Occas. wine   Drug use: No   Social History   Socioeconomic History   Marital status: Single    Spouse  name: Not on file   Number of children: 1   Years of education: Not on file   Highest education level: Not on file  Occupational History   Occupation: MEAT CUTTER    Employer: [employer]  Tobacco Use   Smoking status: Never   Smokeless tobacco: Never  Vaping Use   Vaping status: Never Used  Substance and Sexual Activity   Alcohol use: Yes    Alcohol/week: 0.0 standard drinks of alcohol    Comment: Occas. wine   Drug use: No   Sexual activity: Not Currently    Partners: Male    Birth control/protection: Surgical    Comment: 1st intercourse 63 yo-More than 5 partners,hysterectomy  Other  Topics Concern   Not on file  Social History Narrative   Exercise-- water aerobics   Social Drivers of Health   Financial Resource Strain: Not on file  Food Insecurity: Not on file  Transportation Needs: Not on file  Physical Activity: Not on file  Stress: Not on file  Social Connections: Not on file  Intimate Partner Violence: Not on file   Family Status  Relation Name Status   Mother  Deceased at age 52       PE   Father  Deceased   Sister  Alive   Brother  Alive   Brother  Alive   Neg Hx  (Not Specified)  No partnership data on file   Family History  Problem Relation Age of Onset   Heart disease Mother 24       MI   Stroke Mother    Hypertension Mother    Arthritis Mother    Pulmonary embolism Mother    Prostate cancer Father    Hypertension Father    Arthritis Sister    Colon cancer Neg Hx    Colon polyps Neg Hx    Esophageal cancer Neg Hx    Rectal cancer Neg Hx    Stomach cancer Neg Hx    Breast cancer Neg Hx    No Known Allergies    Review of Systems  Constitutional:  Positive for chills and malaise/fatigue. Negative for fever.  HENT:  Positive for congestion, sinus pain and sore throat. Negative for ear pain.   Eyes:  Negative for blurred vision.  Respiratory:  Positive for cough. Negative for sputum production and shortness of breath.   Cardiovascular:  Negative for chest pain, palpitations and leg swelling.  Gastrointestinal:  Positive for diarrhea. Negative for abdominal pain, blood in stool and nausea.  Genitourinary:  Negative for dysuria and frequency.  Musculoskeletal:  Negative for falls.  Skin:  Negative for rash.  Neurological:  Negative for dizziness, loss of consciousness and headaches.  Endo/Heme/Allergies:  Negative for environmental allergies.  Psychiatric/Behavioral:  Negative for depression. The patient is not nervous/anxious.       Objective:     BP (!) 130/90 (BP Location: Right Arm, Patient Position: Sitting, Cuff Size:  Large)   Pulse 75   Temp 98.8 F (37.1 C) (Oral)   Resp 18   Ht 5\' 5"  (1.651 m)   Wt 196 lb (88.9 kg)   SpO2 99%   BMI 32.62 kg/m  BP Readings from Last 3 Encounters:  10/30/23 (!) 130/90  05/08/23 117/80  04/06/23 (!) 110/90   Wt Readings from Last 3 Encounters:  10/30/23 196 lb (88.9 kg)  05/08/23 199 lb 4.7 oz (90.4 kg)  04/06/23 200 lb 3.2 oz (90.8 kg)   SpO2 Readings from  Last 3 Encounters:  10/30/23 99%  05/08/23 100%  04/06/23 97%      Physical Exam Vitals and nursing note reviewed.  Constitutional:      General: She is not in acute distress.    Appearance: Normal appearance. She is well-developed.  HENT:     Head: Normocephalic and atraumatic.     Right Ear: Tympanic membrane and ear canal normal.     Left Ear: Tympanic membrane and ear canal normal.     Nose: Congestion present.     Right Sinus: Maxillary sinus tenderness and frontal sinus tenderness present.     Left Sinus: Maxillary sinus tenderness and frontal sinus tenderness present.     Mouth/Throat:     Pharynx: Posterior oropharyngeal erythema and postnasal drip present.  Eyes:     General: No scleral icterus.       Right eye: No discharge.        Left eye: No discharge.  Cardiovascular:     Rate and Rhythm: Normal rate and regular rhythm.     Heart sounds: No murmur heard. Pulmonary:     Effort: Pulmonary effort is normal. No respiratory distress.     Breath sounds: Normal breath sounds.  Musculoskeletal:        General: Normal range of motion.     Cervical back: Normal range of motion and neck supple.     Right lower leg: No edema.     Left lower leg: No edema.  Lymphadenopathy:     Cervical: No cervical adenopathy.  Skin:    General: Skin is warm and dry.  Neurological:     Mental Status: She is alert and oriented to person, place, and time.  Psychiatric:        Mood and Affect: Mood normal.        Behavior: Behavior normal.        Thought Content: Thought content normal.         Judgment: Judgment normal.     Results for orders placed or performed in visit on 10/30/23  POC COVID-19  Result Value Ref Range   SARS Coronavirus 2 Ag Negative Negative  POC Influenza A&B (Binax test)  Result Value Ref Range   Influenza A, POC Negative Negative   Influenza B, POC Negative Negative    Last CBC Lab Results  Component Value Date   WBC 4.1 04/06/2023   HGB 12.0 04/06/2023   HCT 37.7 04/06/2023   MCV 88.3 04/06/2023   MCH 28.4 10/02/2022   RDW 14.2 04/06/2023   PLT 212.0 04/06/2023   Last metabolic panel Lab Results  Component Value Date   GLUCOSE 96 04/06/2023   NA 140 04/06/2023   K 3.9 04/06/2023   CL 105 04/06/2023   CO2 28 04/06/2023   BUN 10 04/06/2023   CREATININE 0.71 04/06/2023   GFR 90.65 04/06/2023   CALCIUM 9.0 04/06/2023   PROT 6.9 04/06/2023   ALBUMIN 4.2 04/06/2023   BILITOT 0.6 04/06/2023   ALKPHOS 100 04/06/2023   AST 20 04/06/2023   ALT 12 04/06/2023   ANIONGAP 14 10/02/2022   Last lipids Lab Results  Component Value Date   CHOL 238 (H) 04/06/2023   HDL 56.30 04/06/2023   LDLCALC 148 (H) 04/06/2023   LDLDIRECT 136.6 09/19/2012   TRIG 169.0 (H) 04/06/2023   CHOLHDL 4 04/06/2023   Last hemoglobin A1c No results found for: "HGBA1C" Last thyroid functions Lab Results  Component Value Date   TSH 0.90 04/06/2023  Last vitamin D Lab Results  Component Value Date   VD25OH 9.78 (L) 01/18/2023   Last vitamin B12 and Folate Lab Results  Component Value Date   VITAMINB12 288 06/09/2013      The 10-year ASCVD risk score (Arnett DK, et al., 2019) is: 8.1%    Assessment & Plan:   Problem List Items Addressed This Visit   None Visit Diagnoses       Flu-like symptoms    -  Primary   Relevant Medications   oseltamivir (TAMIFLU) 75 MG capsule   promethazine-dextromethorphan (PROMETHAZINE-DM) 6.25-15 MG/5ML syrup   Other Relevant Orders   POC COVID-19 (Completed)   POC Influenza A&B (Binax test) (Completed)      Acute non-recurrent pansinusitis       Relevant Medications   oseltamivir (TAMIFLU) 75 MG capsule   amoxicillin-clavulanate (AUGMENTIN) 875-125 MG tablet   promethazine-dextromethorphan (PROMETHAZINE-DM) 6.25-15 MG/5ML syrup     Assessment and Plan    Influenza Acute symptoms began Sunday, including severe headache, sore throat, body aches, and cough. Despite negative flu and COVID tests, clinical presentation suggests influenza. Discussed potential for false negatives and rationale for Tamiflu treatment, expecting improvement by tomorrow or Thursday. Recommend acetaminophen or ibuprofen for pain and fever. Prescribe Tamiflu, recommend rest, and advise acetaminophen or ibuprofen for symptom management.  Sinusitis Symptoms of sinus pressure, sore throat, and drainage likely due to viral infection. Discussed nasal spray for symptom relief and continued use of Xyzal. Prescribe Astelin nasal spray and recommend continued Xyzal use.  Cough Persistent non-productive cough requires symptomatic relief. Prescribe cough syrup.  Diarrhea Acute diarrhea began this morning, likely viral. No abdominal pain present. Advise monitoring symptoms and maintaining hydration.  Follow-up Advise calling the office if needed.        No follow-ups on file.    Donato Schultz, DO

## 2023-10-30 NOTE — Telephone Encounter (Signed)
 Copied from CRM [id number]. Topic: Clinical - Red Word Triage >> Oct 30, 2023  7:33 AM [place] A wrote: [facility] that prompted transfer to Nurse Triage: Headache 1-10;9 throat hurting,body aches, cold chills-started Sunday  Chief Complaint: body aches, cough, cold, chills, runny nose, headache, difficulty breathing Symptoms: see above Frequency: Started Sunday Pertinent Negatives: Patient denies cp, sob, fever Disposition: []ED /[]Urgent Care (no appt availability in office) / [x]Appointment(In office/virtual)/ [] [facility]/ []Home Care/ []Refused Recommended Disposition /[][facility]/ [] Follow-up with PCP Additional Notes: per protocol apt made for today; care advice given and instructed to go to ER if becomes worse.   Reason for Disposition  [1] Nasal discharge AND [2] present > 10 days  Answer Assessment - Initial Assessment Questions 1. WORST SYMPTOM: "What is your worst symptom?" (e.g., cough, runny nose, muscle aches, headache, sore throat, fever)      Headache and sore throat 2. ONSET: "When did your flu symptoms start?"      Sunday 3. COUGH: "How bad is the cough?"       yes 4. RESPIRATORY DISTRESS: "Describe your breathing."      Difficulty breathing 5. FEVER: "Do you have a fever?" If Yes, ask: "What is your temperature, how was it measured, and when did it start?"     97 ., hot and cold 6. EXPOSURE: "Were you exposed to someone with influenza?"       Maybe brother 7. FLU VACCINE: "Did you get a flu shot this year?"     yes 8. HIGH RISK DISEASE: "Do you have any chronic medical problems?" (e.g., heart or lung disease, asthma, weak immune system, or other HIGH RISK conditions)     denies 9. PREGNANCY: "Is there any chance you are pregnant?" "When was your last menstrual period?"     na 10. OTHER SYMPTOMS: "Do you have any other symptoms?"  (e.g., runny nose, muscle aches, headache, sore throat)       Body aches, sore throat runny nose  chills.  Protocols used: Influenza (Flu) - [facility]

## 2023-11-03 DIAGNOSIS — Z419 Encounter for procedure for purposes other than remedying health state, unspecified: Secondary | ICD-10-CM | POA: Diagnosis not present

## 2023-11-12 ENCOUNTER — Other Ambulatory Visit: Payer: Self-pay | Admitting: Family Medicine

## 2023-12-15 DIAGNOSIS — Z419 Encounter for procedure for purposes other than remedying health state, unspecified: Secondary | ICD-10-CM | POA: Diagnosis not present

## 2024-01-14 DIAGNOSIS — Z419 Encounter for procedure for purposes other than remedying health state, unspecified: Secondary | ICD-10-CM | POA: Diagnosis not present

## 2024-01-21 ENCOUNTER — Encounter: Payer: 59 | Admitting: Family Medicine

## 2024-01-22 ENCOUNTER — Encounter: Payer: Self-pay | Admitting: Family Medicine

## 2024-01-22 ENCOUNTER — Ambulatory Visit (INDEPENDENT_AMBULATORY_CARE_PROVIDER_SITE_OTHER): Admitting: Family Medicine

## 2024-01-22 VITALS — BP 124/88 | HR 65 | Temp 98.0°F | Resp 18 | Ht 65.0 in | Wt 195.6 lb

## 2024-01-22 DIAGNOSIS — E559 Vitamin D deficiency, unspecified: Secondary | ICD-10-CM | POA: Diagnosis not present

## 2024-01-22 DIAGNOSIS — J4520 Mild intermittent asthma, uncomplicated: Secondary | ICD-10-CM | POA: Insufficient documentation

## 2024-01-22 DIAGNOSIS — Z13220 Encounter for screening for lipoid disorders: Secondary | ICD-10-CM | POA: Diagnosis not present

## 2024-01-22 DIAGNOSIS — Z0000 Encounter for general adult medical examination without abnormal findings: Secondary | ICD-10-CM | POA: Diagnosis not present

## 2024-01-22 DIAGNOSIS — E2839 Other primary ovarian failure: Secondary | ICD-10-CM | POA: Diagnosis not present

## 2024-01-22 DIAGNOSIS — R600 Localized edema: Secondary | ICD-10-CM | POA: Diagnosis not present

## 2024-01-22 LAB — VITAMIN D 25 HYDROXY (VIT D DEFICIENCY, FRACTURES): VITD: 20.98 ng/mL — ABNORMAL LOW (ref 30.00–100.00)

## 2024-01-22 LAB — CBC WITH DIFFERENTIAL/PLATELET
Basophils Absolute: 0 10*3/uL (ref 0.0–0.1)
Basophils Relative: 0.5 % (ref 0.0–3.0)
Eosinophils Absolute: 0 10*3/uL (ref 0.0–0.7)
Eosinophils Relative: 0.7 % (ref 0.0–5.0)
HCT: 37.6 % (ref 36.0–46.0)
Hemoglobin: 12.2 g/dL (ref 12.0–15.0)
Lymphocytes Relative: 45.1 % (ref 12.0–46.0)
Lymphs Abs: 2 10*3/uL (ref 0.7–4.0)
MCHC: 32.5 g/dL (ref 30.0–36.0)
MCV: 87.1 fl (ref 78.0–100.0)
Monocytes Absolute: 0.3 10*3/uL (ref 0.1–1.0)
Monocytes Relative: 6.2 % (ref 3.0–12.0)
Neutro Abs: 2.1 10*3/uL (ref 1.4–7.7)
Neutrophils Relative %: 47.5 % (ref 43.0–77.0)
Platelets: 194 10*3/uL (ref 150.0–400.0)
RBC: 4.31 Mil/uL (ref 3.87–5.11)
RDW: 14.4 % (ref 11.5–15.5)
WBC: 4.4 10*3/uL (ref 4.0–10.5)

## 2024-01-22 LAB — TSH: TSH: 1.36 u[IU]/mL (ref 0.35–5.50)

## 2024-01-22 MED ORDER — ALBUTEROL SULFATE HFA 108 (90 BASE) MCG/ACT IN AERS: 2.0000 | INHALATION_SPRAY | Freq: Four times a day (QID) | RESPIRATORY_TRACT | Status: AC | PRN

## 2024-01-22 MED ORDER — HYDROCHLOROTHIAZIDE 25 MG PO TABS: 25.0000 mg | ORAL_TABLET | Freq: Every day | ORAL | 3 refills | Status: AC

## 2024-01-22 NOTE — Assessment & Plan Note (Signed)
.  ghm utd Check labs  See AVS  Health Maintenance  Topic Date Due   Pneumococcal Vaccine 25-64 Years old (1 of 2 - PCV) Never done   MAMMOGRAM  04/28/2023   COVID-19 Vaccine (1 - 2024-25 season) 02/07/2024 (Originally 05/06/2023)   INFLUENZA VACCINE  04/04/2024   Colonoscopy  09/22/2031   DTaP/Tdap/Td (3 - Td or Tdap) 01/17/2033   Hepatitis C Screening  Completed   HIV Screening  Completed   Zoster Vaccines- Shingrix   Completed   HPV VACCINES  Aged Out   Meningococcal B Vaccine  Aged Out

## 2024-01-22 NOTE — Patient Instructions (Signed)
 Preventive Care 16-64 Years Old, Female  Preventive care refers to lifestyle choices and visits with your health care provider that can promote health and wellness. Preventive care visits are also called wellness exams.  What can I expect for my preventive care visit?  Counseling  Your health care provider may ask you questions about your:  Medical history, including:  Past medical problems.  Family medical history.  Pregnancy history.  Current health, including:  Menstrual cycle.  Method of birth control.  Emotional well-being.  Home life and relationship well-being.  Sexual activity and sexual health.  Lifestyle, including:  Alcohol, nicotine or tobacco, and drug use.  Access to firearms.  Diet, exercise, and sleep habits.  Work and work Astronomer.  Sunscreen use.  Safety issues such as seatbelt and bike helmet use.  Physical exam  Your health care provider will check your:  Height and weight. These may be used to calculate your BMI (body mass index). BMI is a measurement that tells if you are at a healthy weight.  Waist circumference. This measures the distance around your waistline. This measurement also tells if you are at a healthy weight and may help predict your risk of certain diseases, such as type 2 diabetes and high blood pressure.  Heart rate and blood pressure.  Body temperature.  Skin for abnormal spots.  What immunizations do I need?    Vaccines are usually given at various ages, according to a schedule. Your health care provider will recommend vaccines for you based on your age, medical history, and lifestyle or other factors, such as travel or where you work.  What tests do I need?  Screening  Your health care provider may recommend screening tests for certain conditions. This may include:  Lipid and cholesterol levels.  Diabetes screening. This is done by checking your blood sugar (glucose) after you have not eaten for a while (fasting).  Pelvic exam and Pap test.  Hepatitis B test.  Hepatitis C  test.  HIV (human immunodeficiency virus) test.  STI (sexually transmitted infection) testing, if you are at risk.  Lung cancer screening.  Colorectal cancer screening.  Mammogram. Talk with your health care provider about when you should start having regular mammograms. This may depend on whether you have a family history of breast cancer.  BRCA-related cancer screening. This may be done if you have a family history of breast, ovarian, tubal, or peritoneal cancers.  Bone density scan. This is done to screen for osteoporosis.  Talk with your health care provider about your test results, treatment options, and if necessary, the need for more tests.  Follow these instructions at home:  Eating and drinking    Eat a diet that includes fresh fruits and vegetables, whole grains, lean protein, and low-fat dairy products.  Take vitamin and mineral supplements as recommended by your health care provider.  Do not drink alcohol if:  Your health care provider tells you not to drink.  You are pregnant, may be pregnant, or are planning to become pregnant.  If you drink alcohol:  Limit how much you have to 0-1 drink a day.  Know how much alcohol is in your drink. In the [place] one drink equals one 12 oz bottle of beer (355 mL), one 5 oz glass of wine (148 mL), or one 1 oz glass of hard liquor (44 mL).  Lifestyle  Brush your teeth every morning and night with fluoride toothpaste. Floss one time each day.  Exercise for at least  30 minutes 5 or more days each week.  Do not use any products that contain nicotine or tobacco. These products include cigarettes, chewing tobacco, and vaping devices, such as e-cigarettes. If you need help quitting, ask your health care provider.  Do not use drugs.  If you are sexually active, practice safe sex. Use a condom or other form of protection to prevent STIs.  If you do not wish to become pregnant, use a form of birth control. If you plan to become pregnant, see your health care provider for a  prepregnancy visit.  Take aspirin only as told by your health care provider. Make sure that you understand how much to take and what form to take. Work with your health care provider to find out whether it is safe and beneficial for you to take aspirin daily.  Find healthy ways to manage stress, such as:  Meditation, yoga, or listening to music.  Journaling.  Talking to a trusted person.  Spending time with friends and family.  Minimize exposure to UV radiation to reduce your risk of skin cancer.  Safety  Always wear your seat belt while driving or riding in a vehicle.  Do not drive:  If you have been drinking alcohol. Do not ride with someone who has been drinking.  When you are tired or distracted.  While texting.  If you have been using any mind-altering substances or drugs.  Wear a helmet and other protective equipment during sports activities.  If you have firearms in your house, make sure you follow all gun safety procedures.  Seek help if you have been physically or sexually abused.  What's next?  Visit your health care provider once a year for an annual wellness visit.  Ask your health care provider how often you should have your eyes and teeth checked.  Stay up to date on all vaccines.  This information is not intended to replace advice given to you by your health care provider. Make sure you discuss any questions you have with your health care provider.  Document Revised: 02/16/2021 Document Reviewed: 02/16/2021  Elsevier Patient Education  2024 [employer].

## 2024-01-22 NOTE — Progress Notes (Signed)
 Established Patient Office Visit  Subjective   Patient ID: [name], female    DOB: 07/08/1961  Age: 63 y.o. MRN: [id number]  Chief Complaint  Patient presents with   Annual Exam    Pt states fasting     HPI Discussed the use of AI scribe software for clinical note transcription with the patient, who gave verbal consent to proceed.  History of Present Illness [name] is a 63 year old female who presents with persistent low back pain following left gluteus medius repair surgery.  She experiences intermittent low back pain, particularly after walking or engaging in physical activity. The pain is localized to the lower back and does not radiate down the leg. It can be severe enough to make it difficult to get up or sit down after prolonged sitting.  She underwent left gluteus medius repair surgery on September 3rd of the previous year. Since the surgery, there has been some improvement, but episodes of pain persist. A previous injection provided temporary relief for about a month, but the pain has returned.  She has a history of sciatica, which was previously treated successfully. No muscle spasms are reported, and the pain does not radiate down the leg.  She takes ibuprofen , which helps alleviate the pain to some extent. Wearing certain shoes exacerbates her symptoms. She has not had an MRI or other imaging studies for her current back pain.   Patient Active Problem List   Diagnosis Date Noted   Vitamin D  deficiency 01/22/2024   Lower extremity edema 01/22/2024   Estrogen deficiency 01/22/2024   Mild intermittent asthma without complication 01/22/2024   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   Upper respiratory tract infection 04/06/2023   Sore throat 04/06/2023   COVID-19 09/28/2022   Right calf pain 09/28/2022   Acute cough 09/28/2022   Rotator cuff arthropathy of left shoulder    Preop examination 12/30/2021   Arthritis of carpometacarpal [facility]) joint of left  thumb 12/02/2021   Lumbar radiculopathy 05/29/2017   Greater trochanteric bursitis of left hip 05/29/2017   Contusion, hip and thigh, left, initial encounter 05/29/2017   Left hip pain 04/30/2017   Abdominal pain, LLQ 04/30/2017   Back pain 02/16/2015   Obesity (BMI 30-39.9) 06/09/2013   Preventative health care 03/25/2011   OSTEOARTHRITIS 12/15/2009   IMPAIRMENT, ONE EYE MODERATE, OTHER NOS 07/01/2007   SBO 07/01/2007   HERNIA, UMBILICAL 06/11/2007   Past Medical History:  Diagnosis Date   Arthritis    rheumatoid per pt   Past Surgical History:  Procedure Laterality Date   ABDOMINAL HYSTERECTOMY  2001   TAH/BSO fibroids   CATARACT EXTRACTION Right    COLONOSCOPY  04/27/2011   GLUTEUS MINIMUS REPAIR Left 05/08/2023   Procedure: LEFT GLUTEUS MEDIUS  REPAIR  COLLAGEN PATCH;  Surgeon: Wilhelmenia Harada, MD;  Location: [facility];  Service: Orthopedics;  Laterality: Left;   obstructed bile duct  2004   Dr Dolly Fret   OOPHORECTOMY     BSO   REVERSE SHOULDER ARTHROPLASTY Left 02/14/2022   Procedure: LEFT REVERSE SHOULDER ARTHROPLASTY;  Surgeon: Wilhelmenia Harada, MD;  Location: [facility] OR;  Service: Orthopedics;  Laterality: Left;   ROTATOR CUFF REPAIR Bilateral 12/2010   Left and right   Social History   Tobacco Use   Smoking status: Never   Smokeless tobacco: Never  Vaping Use   Vaping status: Never Used  Substance Use Topics   Alcohol use: Yes    Alcohol/week: 0.0 standard  drinks of alcohol    Comment: Occas. wine   Drug use: No   Social History   Socioeconomic History   Marital status: Single    Spouse name: Not on file   Number of children: 1   Years of education: Not on file   Highest education level: Not on file  Occupational History   Occupation: MEAT CUTTER    Employer: [employer]  Tobacco Use   Smoking status: Never   Smokeless tobacco: Never  Vaping Use   Vaping status: Never Used  Substance and Sexual Activity   Alcohol use: Yes     Alcohol/week: 0.0 standard drinks of alcohol    Comment: Occas. wine   Drug use: No   Sexual activity: Not Currently    Partners: Male    Birth control/protection: Surgical    Comment: 1st intercourse 63 yo-More than 5 partners,hysterectomy  Other Topics Concern   Not on file  Social History Narrative   Exercise-- water aerobics   Social Drivers of Health   Financial Resource Strain: Not on file  Food Insecurity: Not on file  Transportation Needs: Not on file  Physical Activity: Not on file  Stress: Not on file  Social Connections: Not on file  Intimate Partner Violence: Not on file   Family Status  Relation Name Status   Mother  Deceased at age 28       PE   Father  Deceased   Sister  Audiological scientist  Alive   Brother  Alive   Neg Hx  (Not Specified)  No partnership data on file   Family History  Problem Relation Age of Onset   Heart disease Mother 35       MI   Stroke Mother    Hypertension Mother    Arthritis Mother    Pulmonary embolism Mother    Prostate cancer Father    Hypertension Father    Dementia Father    Arthritis Sister    Seizures Sister    Colon cancer Neg Hx    Colon polyps Neg Hx    Esophageal cancer Neg Hx    Rectal cancer Neg Hx    Stomach cancer Neg Hx    Breast cancer Neg Hx    No Known Allergies    Review of Systems  Constitutional:  Negative for chills, fever and malaise/fatigue.  HENT:  Negative for congestion and hearing loss.   Eyes:  Negative for blurred vision and discharge.  Respiratory:  Negative for cough, sputum production and shortness of breath.   Cardiovascular:  Negative for chest pain, palpitations and leg swelling.  Gastrointestinal:  Negative for abdominal pain, blood in stool, constipation, diarrhea, heartburn, nausea and vomiting.  Genitourinary:  Negative for dysuria, frequency, hematuria and urgency.  Musculoskeletal:  Negative for back pain, falls and myalgias.  Skin:  Negative for rash.  Neurological:   Negative for dizziness, sensory change, loss of consciousness, weakness and headaches.  Endo/Heme/Allergies:  Negative for environmental allergies. Does not bruise/bleed easily.  Psychiatric/Behavioral:  Negative for depression and suicidal ideas. The patient is not nervous/anxious and does not have insomnia.       Objective:     BP 124/88 (BP Location: Left Arm, Patient Position: Sitting, Cuff Size: Large)   Pulse 65   Temp 98 F (36.7 C) (Oral)   Resp 18   Ht 5\' 5"  (1.651 m)   Wt 195 lb 9.6 oz (88.7 kg)   SpO2 99%  BMI 32.55 kg/m  BP Readings from Last 3 Encounters:  01/22/24 124/88  10/30/23 (!) 130/90  05/08/23 117/80   Wt Readings from Last 3 Encounters:  01/22/24 195 lb 9.6 oz (88.7 kg)  10/30/23 196 lb (88.9 kg)  05/08/23 199 lb 4.7 oz (90.4 kg)   SpO2 Readings from Last 3 Encounters:  01/22/24 99%  10/30/23 99%  05/08/23 100%      Physical Exam Vitals and nursing note reviewed.  Constitutional:      General: She is not in acute distress.    Appearance: Normal appearance. She is well-developed.  HENT:     Head: Normocephalic and atraumatic.     Right Ear: Tympanic membrane, ear canal and external ear normal. There is no impacted cerumen.     Left Ear: Tympanic membrane, ear canal and external ear normal. There is no impacted cerumen.     Nose: Nose normal.     Mouth/Throat:     Mouth: Mucous membranes are moist.     Pharynx: Oropharynx is clear. No oropharyngeal exudate or posterior oropharyngeal erythema.  Eyes:     General: No scleral icterus.       Right eye: No discharge.        Left eye: No discharge.     Conjunctiva/sclera: Conjunctivae normal.     Pupils: Pupils are equal, round, and reactive to light.  Neck:     Thyroid : No thyromegaly or thyroid  tenderness.     Vascular: No JVD.  Cardiovascular:     Rate and Rhythm: Normal rate and regular rhythm.     Heart sounds: Normal heart sounds. No murmur heard. Pulmonary:     Effort: Pulmonary  effort is normal. No respiratory distress.     Breath sounds: Normal breath sounds.  Abdominal:     General: Bowel sounds are normal. There is no distension.     Palpations: Abdomen is soft. There is no mass.     Tenderness: There is no abdominal tenderness. There is no guarding or rebound.  Genitourinary:    Vagina: Normal.  Musculoskeletal:        General: Swelling present. Normal range of motion.     Cervical back: Normal range of motion and neck supple.     Right lower leg: Swelling present. Edema present.     Left lower leg: Swelling present. Edema present.  Lymphadenopathy:     Cervical: No cervical adenopathy.  Skin:    General: Skin is warm and dry.     Findings: No erythema or rash.  Neurological:     Mental Status: She is alert and oriented to person, place, and time.     Cranial Nerves: No cranial nerve deficit.     Deep Tendon Reflexes: Reflexes are normal and symmetric.  Psychiatric:        Mood and Affect: Mood normal.        Behavior: Behavior normal.        Thought Content: Thought content normal.        Judgment: Judgment normal.      No results found for any visits on 01/22/24.  Last CBC Lab Results  Component Value Date   WBC 4.1 04/06/2023   HGB 12.0 04/06/2023   HCT 37.7 04/06/2023   MCV 88.3 04/06/2023   MCH 28.4 10/02/2022   RDW 14.2 04/06/2023   PLT 212.0 04/06/2023   Last metabolic panel Lab Results  Component Value Date   GLUCOSE 96 04/06/2023   NA 140 04/06/2023  K 3.9 04/06/2023   CL 105 04/06/2023   CO2 28 04/06/2023   BUN 10 04/06/2023   CREATININE 0.71 04/06/2023   GFR 90.65 04/06/2023   CALCIUM 9.0 04/06/2023   PROT 6.9 04/06/2023   ALBUMIN 4.2 04/06/2023   BILITOT 0.6 04/06/2023   ALKPHOS 100 04/06/2023   AST 20 04/06/2023   ALT 12 04/06/2023   ANIONGAP 14 10/02/2022   Last lipids Lab Results  Component Value Date   CHOL 238 (H) 04/06/2023   HDL 56.30 04/06/2023   LDLCALC 148 (H) 04/06/2023   LDLDIRECT 136.6  09/19/2012   TRIG 169.0 (H) 04/06/2023   CHOLHDL 4 04/06/2023   Last hemoglobin A1c No results found for: "HGBA1C" Last thyroid  functions Lab Results  Component Value Date   TSH 0.90 04/06/2023   Last vitamin D  Lab Results  Component Value Date   VD25OH 9.78 (L) 01/18/2023   Last vitamin B12 and Folate Lab Results  Component Value Date   VITAMINB12 288 06/09/2013      The 10-year ASCVD risk score (Arnett DK, et al., 2019) is: 7.2%    Assessment & Plan:   Problem List Items Addressed This Visit       Unprioritized   Vitamin D  deficiency   Relevant Orders   VITAMIN D  25 Hydroxy (Vit-D Deficiency, Fractures)   Preventative health care - Primary   .ghm utd Check labs  See AVS  Health Maintenance  Topic Date Due   Pneumococcal Vaccine 65-53 Years old (1 of 2 - PCV) Never done   MAMMOGRAM  04/28/2023   COVID-19 Vaccine (1 - 2024-25 season) 02/07/2024 (Originally 05/06/2023)   INFLUENZA VACCINE  04/04/2024   Colonoscopy  09/22/2031   DTaP/Tdap/Td (3 - Td or Tdap) 01/17/2033   Hepatitis C Screening  Completed   HIV Screening  Completed   Zoster Vaccines- Shingrix   Completed   HPV VACCINES  Aged Out   Meningococcal B Vaccine  Aged Out         Relevant Orders   CBC with Differential/Platelet   Comprehensive metabolic panel with GFR   Lipid panel   TSH   Mild intermittent asthma without complication   Relevant Medications   albuterol (VENTOLIN HFA) 108 (90 Base) MCG/ACT inhaler   Lower extremity edema   Relevant Medications   hydrochlorothiazide (HYDRODIURIL) 25 MG tablet   Estrogen deficiency   Relevant Orders   DG BONE DENSITY (DXA)  Assessment and Plan Assessment & Plan Left hip pain post-surgery   Persistent left hip pain continues following left gluteus medius repair surgery last September, worsened by prolonged sitting and physical activity. Previous injection provided temporary relief. No new imaging studies have been conducted since surgery.  Consider contacting Dr. Burnard Carrow for further evaluation and potential treatment options.  Swelling in leg post-surgery   Post-surgical leg swelling worsens by day's end without associated pain. No diuretic is currently being taken. Prescribe a low dose diuretic to reduce swelling and advise intake of potassium-rich foods to counteract potential potassium loss.  Back pain   Chronic low back pain is exacerbated by physical activity, particularly prolonged walking or standing. The pain is intermittent, non-radiating, and likely related to overactivity and inadequate footwear support. Previous injection provided temporary relief for about a month. No muscle spasms reported. Consider contacting Dr. Burnard Carrow for further evaluation and potential treatment options. Recommend obtaining supportive footwear to alleviate symptoms.  General Health Maintenance   She is due for a mammogram and bone density screening. No recent Pap smear  is needed due to hysterectomy. Plans to re-enroll in water aerobics post-surgery. Schedule mammogram at the breast center and bone density screening. Encourage resumption of water aerobics for physical activity.  Goals of Care   She strongly prefers to avoid future surgeries, expressing a desire for non-surgical management of her conditions. She humorously mentioned preferring to be taken home by the [facility] rather than undergoing another surgery, highlighting her aversion to surgical interventions.    Return in about 1 year (around 01/21/2025), or if symptoms worsen or fail to improve, for annual exam, fasting.    Clatie Kessen R Lowne Chase, DO

## 2024-01-23 ENCOUNTER — Other Ambulatory Visit: Payer: Self-pay | Admitting: Family Medicine

## 2024-01-23 DIAGNOSIS — Z1231 Encounter for screening mammogram for malignant neoplasm of breast: Secondary | ICD-10-CM

## 2024-01-23 DIAGNOSIS — H5213 Myopia, bilateral: Secondary | ICD-10-CM | POA: Diagnosis not present

## 2024-01-23 LAB — LIPID PANEL
Cholesterol: 240 mg/dL — ABNORMAL HIGH (ref 0–200)
HDL: 63.1 mg/dL (ref >39.00)
LDL Cholesterol: 152 mg/dL — ABNORMAL HIGH (ref 0–99)
NonHDL: 177.22
Total CHOL/HDL Ratio: 4
Triglycerides: 125 mg/dL (ref 0.0–149.0)
VLDL: 25 mg/dL (ref 0.0–40.0)

## 2024-01-23 LAB — COMPREHENSIVE METABOLIC PANEL WITH GFR
ALT: 8 U/L (ref 0–35)
AST: 18 U/L (ref 0–37)
Albumin: 4.2 g/dL (ref 3.5–5.2)
Alkaline Phosphatase: 106 U/L (ref 39–117)
BUN: 14 mg/dL (ref 6–23)
CO2: 26 meq/L (ref 19–32)
Calcium: 9 mg/dL (ref 8.4–10.5)
Chloride: 106 meq/L (ref 96–112)
Creatinine, Ser: 0.67 mg/dL (ref 0.40–1.20)
GFR: 92.67 mL/min (ref >60.00)
Glucose, Bld: 80 mg/dL (ref 70–99)
Potassium: 4.2 meq/L (ref 3.5–5.1)
Sodium: 140 meq/L (ref 135–145)
Total Bilirubin: 0.5 mg/dL (ref 0.2–1.2)
Total Protein: 6.9 g/dL (ref 6.0–8.3)

## 2024-01-30 ENCOUNTER — Ambulatory Visit: Payer: Self-pay | Admitting: Family Medicine

## 2024-02-04 ENCOUNTER — Ambulatory Visit (HOSPITAL_BASED_OUTPATIENT_CLINIC_OR_DEPARTMENT_OTHER): Admitting: Orthopaedic Surgery

## 2024-02-06 ENCOUNTER — Ambulatory Visit (HOSPITAL_BASED_OUTPATIENT_CLINIC_OR_DEPARTMENT_OTHER)
Admission: RE | Admit: 2024-02-06 | Discharge: 2024-02-06 | Disposition: A | Discharge status: Home / Self Care | Source: Ambulatory Visit | Attending: Family Medicine | Admitting: Family Medicine

## 2024-02-06 ENCOUNTER — Ambulatory Visit (HOSPITAL_BASED_OUTPATIENT_CLINIC_OR_DEPARTMENT_OTHER)

## 2024-02-06 ENCOUNTER — Other Ambulatory Visit (HOSPITAL_BASED_OUTPATIENT_CLINIC_OR_DEPARTMENT_OTHER): Payer: Self-pay

## 2024-02-06 ENCOUNTER — Ambulatory Visit (HOSPITAL_BASED_OUTPATIENT_CLINIC_OR_DEPARTMENT_OTHER): Admitting: Orthopaedic Surgery

## 2024-02-06 DIAGNOSIS — M25552 Pain in left hip: Secondary | ICD-10-CM

## 2024-02-06 DIAGNOSIS — M47816 Spondylosis without myelopathy or radiculopathy, lumbar region: Secondary | ICD-10-CM | POA: Diagnosis not present

## 2024-02-06 DIAGNOSIS — E2839 Other primary ovarian failure: Secondary | ICD-10-CM | POA: Insufficient documentation

## 2024-02-06 DIAGNOSIS — M545 Low back pain, unspecified: Secondary | ICD-10-CM | POA: Diagnosis not present

## 2024-02-06 DIAGNOSIS — Z780 Asymptomatic menopausal state: Secondary | ICD-10-CM | POA: Diagnosis not present

## 2024-02-06 MED ORDER — METHYLPREDNISOLONE 4 MG PO TBPK
ORAL_TABLET | ORAL | 0 refills | Status: DC
Start: 2024-02-06 — End: 2024-04-22
  Filled 2024-02-06: qty 21, 6d supply, fill #0

## 2024-02-06 NOTE — Progress Notes (Signed)
 Post Operative Evaluation    Procedure/Date of Surgery: Status post left gluteus medius repair 9/3  Interval History:   Presents today for follow-up as she is having more groin based left hip pain.  Unfortunately she is now also having back pain with radicular type symptoms going down the left side.  This is making it very hard to do basic activities.  She recently visited her family in [place]  which she had significant pain with  PMH/PSH/Family History/Social History/Meds/Allergies:    Past Medical History:  Diagnosis Date   Arthritis    rheumatoid per pt   Past Surgical History:  Procedure Laterality Date   ABDOMINAL HYSTERECTOMY  2001   TAH/BSO fibroids   CATARACT EXTRACTION Right    COLONOSCOPY  04/27/2011   GLUTEUS MINIMUS REPAIR Left 05/08/2023   Procedure: LEFT GLUTEUS MEDIUS  REPAIR  COLLAGEN PATCH;  Surgeon: Wilhelmenia Harada, MD;  Location: [facility];  Service: Orthopedics;  Laterality: Left;   obstructed bile duct  2004   Dr Dolly Fret   OOPHORECTOMY     BSO   REVERSE SHOULDER ARTHROPLASTY Left 02/14/2022   Procedure: LEFT REVERSE SHOULDER ARTHROPLASTY;  Surgeon: Wilhelmenia Harada, MD;  Location: [facility];  Service: Orthopedics;  Laterality: Left;   ROTATOR CUFF REPAIR Bilateral 12/2010   Left and right   Social History   Socioeconomic History   Marital status: Single    Spouse name: Not on file   Number of children: 1   Years of education: Not on file   Highest education level: Not on file  Occupational History   Occupation: MEAT CUTTER    Employer: [employer]  Tobacco Use   Smoking status: Never   Smokeless tobacco: Never  Vaping Use   Vaping status: Never Used  Substance and Sexual Activity   Alcohol use: Yes    Alcohol/week: 0.0 standard drinks of alcohol    Comment: Occas. wine   Drug use: No   Sexual activity: Not Currently    Partners: Male    Birth control/protection: Surgical    Comment: 1st  intercourse 63 yo-More than 5 partners,hysterectomy  Other Topics Concern   Not on file  Social History Narrative   Exercise-- water aerobics   Social Drivers of Health   Financial Resource Strain: Not on file  Food Insecurity: Not on file  Transportation Needs: Not on file  Physical Activity: Not on file  Stress: Not on file  Social Connections: Not on file   Family History  Problem Relation Age of Onset   Heart disease Mother 7       MI   Stroke Mother    Hypertension Mother    Arthritis Mother    Pulmonary embolism Mother    Prostate cancer Father    Hypertension Father    Dementia Father    Arthritis Sister    Seizures Sister    Colon cancer Neg Hx    Colon polyps Neg Hx    Esophageal cancer Neg Hx    Rectal cancer Neg Hx    Stomach cancer Neg Hx    Breast cancer Neg Hx    No Known Allergies Current Outpatient Medications  Medication Sig Dispense Refill   methylPREDNISolone  (MEDROL  DOSEPAK) 4 MG TBPK tablet Take per packet instructions 21 each 0   albuterol  (  VENTOLIN  HFA) 108 (90 Base) MCG/ACT inhaler Inhale 2 puffs into the lungs every 6 (six) hours as needed for wheezing or shortness of breath.     aspirin  EC 325 MG tablet Take 1 tablet (325 mg total) by mouth daily. 30 tablet 0   azelastine  (ASTELIN ) 0.1 % nasal spray Place 1 spray into both nostrils 2 (two) times daily. Use in each nostril as directed (Patient not taking: Reported on 05/11/2023) 30 mL 12   Cholecalciferol (VITAMIN D  PO) Take 1 tablet by mouth 2 (two) times a week.     cyclobenzaprine  (FLEXERIL ) 5 MG tablet Take 1 tablet (5 mg total) by mouth at bedtime. (Patient taking differently: Take 5 mg by mouth daily as needed for muscle spasms.) 30 tablet 0   hydrochlorothiazide  (HYDRODIURIL ) 25 MG tablet Take 1 tablet (25 mg total) by mouth daily. 90 tablet 3   levocetirizine (XYZAL ) 5 MG tablet Take 1 tablet (5 mg total) by mouth every evening. 30 tablet 5   LORazepam  (ATIVAN ) 1 MG tablet Take 1 tablet (1  mg total) by mouth every 8 (eight) hours. 2 tablet 0   oxyCODONE  (ROXICODONE ) 5 MG immediate release tablet Take 1 tablet (5 mg total) by mouth every 4 (four) hours as needed for severe pain or breakthrough pain. 15 tablet 0   traMADol  (ULTRAM ) 50 MG tablet Take 1 tablet (50 mg total) by mouth every 6 (six) hours as needed. 30 tablet 2   traMADol  (ULTRAM ) 50 MG tablet Take 1 tablet (50 mg total) by mouth every 6 (six) hours as needed. 30 tablet 2   Vitamin D , Ergocalciferol , (DRISDOL ) 1.25 MG (50000 UNIT) CAPS capsule TAKE 1 CAPSULE (50,000 UNITS TOTAL) BY MOUTH EVERY 7 (SEVEN) DAYS 12 capsule 1   No current facility-administered medications for this visit.   No results found.  Review of Systems:   A ROS was performed including pertinent positives and negatives as documented in the HPI.   Musculoskeletal Exam:    There were no vitals taken for this visit.  Incision is well-appearing without erythema or drainage.  Internal extra rotation is 30 degrees with active flexion to 90 degrees of the hip.  Some weakness with resisted hip abduction distal neurovascular exam is intact  Imaging:      I personally reviewed and interpreted the radiographs.   Assessment:   Status post left hip gluteus medius repair with persistent left-sided hip pain which I do believe may be consistent more with hip instability and possible labral tearing.  She has done extensive physical therapy without any improvement.  She is also having radiating pain down the left side of the hip and as a result I did discuss the possibility of a possible lumbar spine MRI as well as a hip MRI so that we can ascertain the cause of her disabling pain   Plan :    - Plan for MRI lumbar spine and left hip MRI and follow-up to discuss results       I personally saw and evaluated the patient, and participated in the management and treatment plan.  Wilhelmenia Harada, MD Attending Physician, Orthopedic Surgery  This document  was dictated using Dragon voice recognition software. A reasonable attempt at proof reading has been made to minimize errors.

## 2024-02-07 ENCOUNTER — Other Ambulatory Visit (HOSPITAL_BASED_OUTPATIENT_CLINIC_OR_DEPARTMENT_OTHER): Payer: Self-pay | Admitting: Orthopaedic Surgery

## 2024-02-07 ENCOUNTER — Telehealth (HOSPITAL_BASED_OUTPATIENT_CLINIC_OR_DEPARTMENT_OTHER): Payer: Self-pay | Admitting: Orthopaedic Surgery

## 2024-02-07 MED ORDER — LORAZEPAM 1 MG PO TABS: 1.0000 mg | ORAL_TABLET | Freq: Three times a day (TID) | ORAL | 0 refills | Status: DC

## 2024-02-07 NOTE — Telephone Encounter (Signed)
 Patient would like something to take when she has her Mri on June 14. Send to [facility] on [address]

## 2024-02-13 ENCOUNTER — Ambulatory Visit
Admission: RE | Admit: 2024-02-13 | Discharge: 2024-02-13 | Disposition: A | Discharge status: Home / Self Care | Source: Ambulatory Visit | Attending: Family Medicine | Admitting: Family Medicine

## 2024-02-13 DIAGNOSIS — Z1231 Encounter for screening mammogram for malignant neoplasm of breast: Secondary | ICD-10-CM | POA: Diagnosis not present

## 2024-02-14 DIAGNOSIS — Z419 Encounter for procedure for purposes other than remedying health state, unspecified: Secondary | ICD-10-CM | POA: Diagnosis not present

## 2024-02-16 ENCOUNTER — Other Ambulatory Visit

## 2024-02-19 ENCOUNTER — Telehealth: Payer: Self-pay | Admitting: Physical Medicine and Rehabilitation

## 2024-02-19 ENCOUNTER — Telehealth: Payer: Self-pay

## 2024-02-19 ENCOUNTER — Telehealth (HOSPITAL_BASED_OUTPATIENT_CLINIC_OR_DEPARTMENT_OTHER): Payer: Self-pay | Admitting: Orthopaedic Surgery

## 2024-02-19 NOTE — Telephone Encounter (Signed)
 Patient returned call and I gave her your message; just FYI

## 2024-02-19 NOTE — Telephone Encounter (Signed)
 Pt called about approval for injection with [facility]. Please call pt at [phone]

## 2024-02-19 NOTE — Telephone Encounter (Signed)
 Patient wants to know if she can get some pain meds called in until she gets her MRI

## 2024-02-20 ENCOUNTER — Other Ambulatory Visit: Payer: Self-pay

## 2024-02-27 ENCOUNTER — Ambulatory Visit (HOSPITAL_BASED_OUTPATIENT_CLINIC_OR_DEPARTMENT_OTHER): Admitting: Orthopaedic Surgery

## 2024-03-01 ENCOUNTER — Ambulatory Visit
Admission: RE | Admit: 2024-03-01 | Discharge: 2024-03-01 | Disposition: A | Discharge status: Home / Self Care | Source: Ambulatory Visit | Attending: Orthopaedic Surgery | Admitting: Orthopaedic Surgery

## 2024-03-01 DIAGNOSIS — M25552 Pain in left hip: Secondary | ICD-10-CM

## 2024-03-05 ENCOUNTER — Ambulatory Visit (HOSPITAL_BASED_OUTPATIENT_CLINIC_OR_DEPARTMENT_OTHER): Admitting: Orthopaedic Surgery

## 2024-03-05 ENCOUNTER — Ambulatory Visit (HOSPITAL_BASED_OUTPATIENT_CLINIC_OR_DEPARTMENT_OTHER): Payer: Self-pay | Admitting: Orthopaedic Surgery

## 2024-03-05 ENCOUNTER — Other Ambulatory Visit (HOSPITAL_BASED_OUTPATIENT_CLINIC_OR_DEPARTMENT_OTHER): Payer: Self-pay

## 2024-03-05 DIAGNOSIS — S73192A Other sprain of left hip, initial encounter: Secondary | ICD-10-CM | POA: Diagnosis not present

## 2024-03-05 MED ORDER — ASPIRIN 325 MG PO TBEC
325.0000 mg | DELAYED_RELEASE_TABLET | Freq: Every day | ORAL | 0 refills | Status: AC
  Filled 2024-03-05: qty 14, 14d supply, fill #0

## 2024-03-05 MED ORDER — ACETAMINOPHEN 500 MG PO TABS
500.0000 mg | ORAL_TABLET | Freq: Three times a day (TID) | ORAL | 0 refills | Status: AC
  Filled 2024-03-05: qty 30, 10d supply, fill #0

## 2024-03-05 MED ORDER — OXYCODONE HCL 5 MG PO TABS
5.0000 mg | ORAL_TABLET | ORAL | 0 refills | Status: AC | PRN
  Filled 2024-03-05: qty 10, 2d supply, fill #0

## 2024-03-05 MED ORDER — IBUPROFEN 800 MG PO TABS
800.0000 mg | ORAL_TABLET | Freq: Three times a day (TID) | ORAL | 0 refills | Status: AC
  Filled 2024-03-05: qty 30, 10d supply, fill #0

## 2024-03-05 NOTE — Addendum Note (Signed)
 Addended by: WOLFGANG CONLEY HERO on: 03/05/2024 01:11 PM   Modules accepted: Orders

## 2024-03-05 NOTE — Progress Notes (Signed)
 Post Operative Evaluation    Procedure/Date of Surgery: Status post left gluteus medius repair 9/3  Interval History:   Presents today for follow-up as she is having more groin based left hip pain.  She is here today for MRI discussion.  She has remained symptomatic.  PMH/PSH/Family History/Social History/Meds/Allergies:    Past Medical History:  Diagnosis Date   Arthritis    rheumatoid per pt   Past Surgical History:  Procedure Laterality Date   ABDOMINAL HYSTERECTOMY  2001   TAH/BSO fibroids   CATARACT EXTRACTION Right    COLONOSCOPY  04/27/2011   GLUTEUS MINIMUS REPAIR Left 05/08/2023   Procedure: LEFT GLUTEUS MEDIUS  REPAIR  COLLAGEN PATCH;  Surgeon: Genelle Standing, MD;  Location: [facility];  Service: Orthopedics;  Laterality: Left;   obstructed bile duct  2004   Dr Ludwig   OOPHORECTOMY     BSO   REVERSE SHOULDER ARTHROPLASTY Left 02/14/2022   Procedure: LEFT REVERSE SHOULDER ARTHROPLASTY;  Surgeon: Genelle Standing, MD;  Location: [facility];  Service: Orthopedics;  Laterality: Left;   ROTATOR CUFF REPAIR Bilateral 12/2010   Left and right   Social History   Socioeconomic History   Marital status: Single    Spouse name: Not on file   Number of children: 1   Years of education: Not on file   Highest education level: Not on file  Occupational History   Occupation: MEAT CUTTER    Employer: [employer]  Tobacco Use   Smoking status: Never   Smokeless tobacco: Never  Vaping Use   Vaping status: Never Used  Substance and Sexual Activity   Alcohol use: Yes    Alcohol/week: 0.0 standard drinks of alcohol    Comment: Occas. wine   Drug use: No   Sexual activity: Not Currently    Partners: Male    Birth control/protection: Surgical    Comment: 1st intercourse 63 yo-More than 5 partners,hysterectomy  Other Topics Concern   Not on file  Social History Narrative   Exercise-- water aerobics   Social Drivers of  Health   Financial Resource Strain: Not on file  Food Insecurity: Not on file  Transportation Needs: Not on file  Physical Activity: Not on file  Stress: Not on file  Social Connections: Not on file   Family History  Problem Relation Age of Onset   Heart disease Mother 47       MI   Stroke Mother    Hypertension Mother    Arthritis Mother    Pulmonary embolism Mother    Prostate cancer Father    Hypertension Father    Dementia Father    Arthritis Sister    Seizures Sister    Colon cancer Neg Hx    Colon polyps Neg Hx    Esophageal cancer Neg Hx    Rectal cancer Neg Hx    Stomach cancer Neg Hx    Breast cancer Neg Hx    No Known Allergies Current Outpatient Medications  Medication Sig Dispense Refill   acetaminophen  (TYLENOL ) 500 MG tablet Take 1 tablet (500 mg total) by mouth every 8 (eight) hours for 10 days. 30 tablet 0   aspirin  EC 325 MG tablet Take 1 tablet (325 mg total) by mouth daily. 14 tablet 0   ibuprofen  (ADVIL ) 800  MG tablet Take 1 tablet (800 mg total) by mouth every 8 (eight) hours for 10 days. Please take with food, please alternate with acetaminophen  30 tablet 0   oxyCODONE  (ROXICODONE ) 5 MG immediate release tablet Take 1 tablet (5 mg total) by mouth every 4 (four) hours as needed for severe pain (pain score 7-10) or breakthrough pain. 10 tablet 0   albuterol  (VENTOLIN  HFA) 108 (90 Base) MCG/ACT inhaler Inhale 2 puffs into the lungs every 6 (six) hours as needed for wheezing or shortness of breath.     aspirin  EC 325 MG tablet Take 1 tablet (325 mg total) by mouth daily. 30 tablet 0   azelastine  (ASTELIN ) 0.1 % nasal spray Place 1 spray into both nostrils 2 (two) times daily. Use in each nostril as directed (Patient not taking: Reported on 05/11/2023) 30 mL 12   Cholecalciferol (VITAMIN D  PO) Take 1 tablet by mouth 2 (two) times a week.     cyclobenzaprine  (FLEXERIL ) 5 MG tablet Take 1 tablet (5 mg total) by mouth at bedtime. (Patient taking differently: Take 5  mg by mouth daily as needed for muscle spasms.) 30 tablet 0   hydrochlorothiazide  (HYDRODIURIL ) 25 MG tablet Take 1 tablet (25 mg total) by mouth daily. 90 tablet 3   levocetirizine (XYZAL ) 5 MG tablet Take 1 tablet (5 mg total) by mouth every evening. 30 tablet 5   LORazepam  (ATIVAN ) 1 MG tablet Take 1 tablet (1 mg total) by mouth every 8 (eight) hours. 2 tablet 0   LORazepam  (ATIVAN ) 1 MG tablet Take 1 tablet (1 mg total) by mouth every 8 (eight) hours. 2 tablet 0   methylPREDNISolone  (MEDROL  DOSEPAK) 4 MG TBPK tablet Take per packet instructions 21 each 0   oxyCODONE  (ROXICODONE ) 5 MG immediate release tablet Take 1 tablet (5 mg total) by mouth every 4 (four) hours as needed for severe pain or breakthrough pain. 15 tablet 0   traMADol  (ULTRAM ) 50 MG tablet Take 1 tablet (50 mg total) by mouth every 6 (six) hours as needed. 30 tablet 2   traMADol  (ULTRAM ) 50 MG tablet Take 1 tablet (50 mg total) by mouth every 6 (six) hours as needed. 30 tablet 2   Vitamin D , Ergocalciferol , (DRISDOL ) 1.25 MG (50000 UNIT) CAPS capsule TAKE 1 CAPSULE (50,000 UNITS TOTAL) BY MOUTH EVERY 7 (SEVEN) DAYS 12 capsule 1   No current facility-administered medications for this visit.   No results found.  Review of Systems:   A ROS was performed including pertinent positives and negatives as documented in the HPI.   Musculoskeletal Exam:    There were no vitals taken for this visit.  Incision is well-appearing without erythema or drainage.  Internal extra rotation is 30 degrees with active flexion to 90 degrees of the hip.  Some weakness with resisted hip abduction distal neurovascular exam is intact  Imaging:    MRI left hip: Anterior superior labral tear without evidence of chondral loss, no evidence of arthritis  I personally reviewed and interpreted the radiographs.   Assessment:   Status post left hip gluteus medius repair with persistent left-sided hip pain which I do believe may be consistent more  with hip instability and MRI does confirm labral tearing.  At this time she has extensively tried multiple months of physical therapy without any relief.  She has had injections as well without any relief.  Given this I did discuss the possibility of hip arthroscopy with labral repair.  I did discuss the risks and  limitations as well as associate recovery timeframe.  After discussion she would like to proceed   Plan :    - Plan for left hip arthroscopy with labral repair   After a lengthy discussion of treatment options, including risks, benefits, alternatives, complications of surgical and nonsurgical conservative options, the patient elected surgical repair.   The patient  is aware of the material risks  and complications including, but not limited to injury to adjacent structures, neurovascular injury, infection, numbness, bleeding, implant failure, thermal burns, stiffness, persistent pain, failure to heal, disease transmission from allograft, need for further surgery, dislocation, anesthetic risks, blood clots, risks of death,and others. The probabilities of surgical success and failure discussed with patient given their particular co-morbidities.The time and nature of expected rehabilitation and recovery was discussed.The patient's questions were all answered preoperatively.  No barriers to understanding were noted. I explained the natural history of the disease process and Rx rationale.  I explained to the patient what I considered to be reasonable expectations given their personal situation.  The final treatment plan was arrived at through a shared patient decision making process model.        I personally saw and evaluated the patient, and participated in the management and treatment plan.  Elspeth Parker, MD Attending Physician, Orthopedic Surgery  This document was dictated using Dragon voice recognition software. A reasonable attempt at proof reading has been made to minimize  errors.

## 2024-03-13 ENCOUNTER — Telehealth: Payer: Self-pay | Admitting: Family Medicine

## 2024-03-13 NOTE — Telephone Encounter (Signed)
 Copied from CRM [id number]. Topic: General - Other >> Mar 13, 2024  3:14 PM Taleah C wrote: Reason for CRM: [facility] called and stated that they sent a fax for a surgical clearance form on 7/2. Please advise at [phone]

## 2024-03-14 ENCOUNTER — Telehealth: Payer: Self-pay | Admitting: Family Medicine

## 2024-03-14 NOTE — Telephone Encounter (Signed)
 Pt called. LVM advising to call back to schedule pre-op exam

## 2024-03-14 NOTE — Telephone Encounter (Unsigned)
 Copied from CRM [id number]. Topic: General - Other >> Mar 13, 2024  3:21 PM Wess RAMAN wrote: Reason for CRM: Patient would like to know the status of her medical clearance paperwork that was faxed on 03/05/24 by [facility] #: [phone] >> Mar 14, 2024  1:18 PM [place] J wrote: Patient wanted to follow back up on paper work that was faxed. She is in pain and is needing the documents filled out as soon as possible. She explained that she has a tear in her hip and is needing this to be fixed since it's affecting her daily lifestyle.

## 2024-03-14 NOTE — Telephone Encounter (Signed)
 Copied from CRM [id number]. Topic: Appointments - Scheduling Inquiry for Clinic >> Mar 14, 2024  4:26 PM Tiffini S wrote: Reason for CRM: Patient is asking for a call back at [phone]- she is asking for a sooner pre-op appointment than 03/25/24 at 5:20 pm. Please advise.

## 2024-03-14 NOTE — Telephone Encounter (Signed)
 Pt advised pcp is on vacation all next week.

## 2024-03-15 DIAGNOSIS — Z419 Encounter for procedure for purposes other than remedying health state, unspecified: Secondary | ICD-10-CM | POA: Diagnosis not present

## 2024-03-25 ENCOUNTER — Ambulatory Visit (INDEPENDENT_AMBULATORY_CARE_PROVIDER_SITE_OTHER): Admitting: Family Medicine

## 2024-03-25 ENCOUNTER — Encounter: Payer: Self-pay | Admitting: Family Medicine

## 2024-03-25 ENCOUNTER — Ambulatory Visit: Admitting: Family Medicine

## 2024-03-25 VITALS — BP 118/80 | HR 62 | Temp 98.0°F | Resp 18 | Ht 65.0 in | Wt 196.6 lb

## 2024-03-25 DIAGNOSIS — Z01818 Encounter for other preprocedural examination: Secondary | ICD-10-CM

## 2024-03-25 NOTE — Progress Notes (Signed)
 Established Patient Office Visit  Subjective   Patient ID: [name], female    DOB: 10/06/1960  Age: 63 y.o. MRN: [id number]  Chief Complaint  Patient presents with   Pre-op Exam    Left hip arthroscopy     HPI Discussed the use of AI scribe software for clinical note transcription with the patient, who gave verbal consent to proceed.  History of Present Illness [name] is a 63 year old female who presents for pre-operative evaluation for hip surgery.  She is preparing for hip surgery and is awaiting a date for the procedure. She has not experienced any previous issues with anesthesia, although she is concerned about potential risks associated with it. Her anxiety has been heightened by a recent incident involving a child who died from anesthesia complications during a dental procedure.  She mentions that her previous surgery involved a pelvic incision, and she is hopeful that the upcoming hip surgery will be less invasive, possibly involving an anterior approach with the use of a camera. She is uncertain about the exact surgical plan or location, whether it will be at a hospital or a surgical center.  She is unsure if pre-operative labs will be required, but understands that clotting studies like PT and INR might be done on the day of surgery.   Patient Active Problem List   Diagnosis Date Noted   Vitamin D  deficiency 01/22/2024   Lower extremity edema 01/22/2024   Estrogen deficiency 01/22/2024   Mild intermittent asthma without complication 01/22/2024   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   Upper respiratory tract infection 04/06/2023   Sore throat 04/06/2023   COVID-19 09/28/2022   Right calf pain 09/28/2022   Acute cough 09/28/2022   Rotator cuff arthropathy of left shoulder    Preop examination 12/30/2021   Arthritis of carpometacarpal [facility]) joint of left thumb 12/02/2021   Lumbar radiculopathy 05/29/2017   Greater trochanteric bursitis of left hip  05/29/2017   Contusion, hip and thigh, left, initial encounter 05/29/2017   Left hip pain 04/30/2017   Abdominal pain, LLQ 04/30/2017   Back pain 02/16/2015   Obesity (BMI 30-39.9) 06/09/2013   Preventative health care 03/25/2011   OSTEOARTHRITIS 12/15/2009   IMPAIRMENT, ONE EYE MODERATE, OTHER NOS 07/01/2007   SBO 07/01/2007   HERNIA, UMBILICAL 06/11/2007   Past Medical History:  Diagnosis Date   Arthritis    rheumatoid per pt   Past Surgical History:  Procedure Laterality Date   ABDOMINAL HYSTERECTOMY  2001   TAH/BSO fibroids   CATARACT EXTRACTION Right    COLONOSCOPY  04/27/2011   GLUTEUS MINIMUS REPAIR Left 05/08/2023   Procedure: LEFT GLUTEUS MEDIUS  REPAIR  COLLAGEN PATCH;  Surgeon: Genelle Standing, MD;  Location: [facility];  Service: Orthopedics;  Laterality: Left;   obstructed bile duct  2004   Dr Ludwig   OOPHORECTOMY     BSO   REVERSE SHOULDER ARTHROPLASTY Left 02/14/2022   Procedure: LEFT REVERSE SHOULDER ARTHROPLASTY;  Surgeon: Genelle Standing, MD;  Location: [facility] OR;  Service: Orthopedics;  Laterality: Left;   ROTATOR CUFF REPAIR Bilateral 12/2010   Left and right   Social History   Tobacco Use   Smoking status: Never   Smokeless tobacco: Never  Vaping Use   Vaping status: Never Used  Substance Use Topics   Alcohol use: Yes    Alcohol/week: 0.0 standard drinks of alcohol    Comment: Occas. wine   Drug use: No   Social  History   Socioeconomic History   Marital status: Single    Spouse name: Not on file   Number of children: 1   Years of education: Not on file   Highest education level: Not on file  Occupational History   Occupation: MEAT CUTTER    Employer: [employer]  Tobacco Use   Smoking status: Never   Smokeless tobacco: Never  Vaping Use   Vaping status: Never Used  Substance and Sexual Activity   Alcohol use: Yes    Alcohol/week: 0.0 standard drinks of alcohol    Comment: Occas. wine   Drug use: No   Sexual  activity: Not Currently    Partners: Male    Birth control/protection: Surgical    Comment: 1st intercourse 63 yo-More than 5 partners,hysterectomy  Other Topics Concern   Not on file  Social History Narrative   Exercise-- water aerobics   Social Drivers of Health   Financial Resource Strain: Not on file  Food Insecurity: Not on file  Transportation Needs: Not on file  Physical Activity: Not on file  Stress: Not on file  Social Connections: Not on file  Intimate Partner Violence: Not on file   Family Status  Relation Name Status   Mother  Deceased at age 38       PE   Father  Deceased   Sister  Audiological scientist  Alive   Brother  Alive   Neg Hx  (Not Specified)  No partnership data on file   Family History  Problem Relation Age of Onset   Heart disease Mother 28       MI   Stroke Mother    Hypertension Mother    Arthritis Mother    Pulmonary embolism Mother    Prostate cancer Father    Hypertension Father    Dementia Father    Arthritis Sister    Seizures Sister    Colon cancer Neg Hx    Colon polyps Neg Hx    Esophageal cancer Neg Hx    Rectal cancer Neg Hx    Stomach cancer Neg Hx    Breast cancer Neg Hx    No Known Allergies    Review of Systems  Constitutional:  Negative for chills, fever and malaise/fatigue.  HENT:  Negative for congestion and hearing loss.   Eyes:  Negative for blurred vision and discharge.  Respiratory:  Negative for cough, sputum production and shortness of breath.   Cardiovascular:  Negative for chest pain, palpitations and leg swelling.  Gastrointestinal:  Negative for abdominal pain, blood in stool, constipation, diarrhea, heartburn, nausea and vomiting.  Genitourinary:  Negative for dysuria, frequency, hematuria and urgency.  Musculoskeletal:  Positive for joint pain. Negative for back pain, falls and myalgias.  Skin:  Negative for rash.  Neurological:  Negative for dizziness, sensory change, loss of consciousness, weakness and  headaches.  Endo/Heme/Allergies:  Negative for environmental allergies. Does not bruise/bleed easily.  Psychiatric/Behavioral:  Negative for depression and suicidal ideas. The patient is not nervous/anxious and does not have insomnia.       Objective:     BP 118/80 (BP Location: Left Arm, Patient Position: Sitting, Cuff Size: Normal)   Pulse 62   Temp 98 F (36.7 C) (Oral)   Resp 18   Ht 5' 5 (1.651 m)   Wt 196 lb 9.6 oz (89.2 kg)   SpO2 96%   BMI 32.72 kg/m  BP Readings from Last 3 Encounters:  03/25/24 118/80  01/22/24 124/88  10/30/23 (!) 130/90   Wt Readings from Last 3 Encounters:  03/25/24 196 lb 9.6 oz (89.2 kg)  01/22/24 195 lb 9.6 oz (88.7 kg)  10/30/23 196 lb (88.9 kg)   SpO2 Readings from Last 3 Encounters:  03/25/24 96%  01/22/24 99%  10/30/23 99%      Physical Exam Vitals and nursing note reviewed.  Constitutional:      General: She is not in acute distress.    Appearance: Normal appearance. She is well-developed.  HENT:     Head: Normocephalic and atraumatic.  Eyes:     General: No scleral icterus.       Right eye: No discharge.        Left eye: No discharge.  Cardiovascular:     Rate and Rhythm: Normal rate and regular rhythm.     Heart sounds: No murmur heard. Pulmonary:     Effort: Pulmonary effort is normal. No respiratory distress.     Breath sounds: Normal breath sounds.  Musculoskeletal:        General: Tenderness present. Normal range of motion.     Cervical back: Normal range of motion and neck supple.     Right lower leg: No edema.     Left lower leg: No edema.  Skin:    General: Skin is warm and dry.  Neurological:     Mental Status: She is alert and oriented to person, place, and time.  Psychiatric:        Mood and Affect: Mood normal.        Behavior: Behavior normal.        Thought Content: Thought content normal.        Judgment: Judgment normal.      No results found for any visits on 03/25/24.  Last CBC Lab  Results  Component Value Date   WBC 4.4 01/22/2024   HGB 12.2 01/22/2024   HCT 37.6 01/22/2024   MCV 87.1 01/22/2024   MCH 28.4 10/02/2022   RDW 14.4 01/22/2024   PLT 194.0 01/22/2024   Last metabolic panel Lab Results  Component Value Date   GLUCOSE 80 01/22/2024   NA 140 01/22/2024   K 4.2 01/22/2024   CL 106 01/22/2024   CO2 26 01/22/2024   BUN 14 01/22/2024   CREATININE 0.67 01/22/2024   GFR 92.67 01/22/2024   CALCIUM 9.0 01/22/2024   PROT 6.9 01/22/2024   ALBUMIN 4.2 01/22/2024   BILITOT 0.5 01/22/2024   ALKPHOS 106 01/22/2024   AST 18 01/22/2024   ALT 8 01/22/2024   ANIONGAP 14 10/02/2022   Last lipids Lab Results  Component Value Date   CHOL 240 (H) 01/22/2024   HDL 63.10 01/22/2024   LDLCALC 152 (H) 01/22/2024   LDLDIRECT 136.6 09/19/2012   TRIG 125.0 01/22/2024   CHOLHDL 4 01/22/2024   Last hemoglobin A1c No results found for: HGBA1C Last thyroid  functions Lab Results  Component Value Date   TSH 1.36 01/22/2024   Last vitamin D  Lab Results  Component Value Date   VD25OH 20.98 (L) 01/22/2024   Last vitamin B12 and Folate Lab Results  Component Value Date   VITAMINB12 288 06/09/2013      The 10-year ASCVD risk score (Arnett DK, et al., 2019) is: 6.8%    Assessment & Plan:   Problem List Items Addressed This Visit       Unprioritized   Preop examination - Primary   Relevant Orders   EKG 12-Lead (Completed)  EKG --  sinus rhythm , 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 She has concerns about general anesthesia, although she has had no previous issues. She understands the risks, including rare complications like anesthesia reactions. The likely anterior surgical approach is less invasive and easier than knee or shoulder surgeries. Await scheduling of the surgery date. Perform pre-operative labs, including PT and INR, on the day of  surgery.   No follow-ups on file.    Burlin Mcnair R Lowne Chase, DO

## 2024-04-15 DIAGNOSIS — Z419 Encounter for procedure for purposes other than remedying health state, unspecified: Secondary | ICD-10-CM | POA: Diagnosis not present

## 2024-04-22 ENCOUNTER — Other Ambulatory Visit: Payer: Self-pay

## 2024-04-22 ENCOUNTER — Encounter (HOSPITAL_BASED_OUTPATIENT_CLINIC_OR_DEPARTMENT_OTHER): Payer: Self-pay | Admitting: Orthopaedic Surgery

## 2024-04-24 ENCOUNTER — Telehealth (HOSPITAL_BASED_OUTPATIENT_CLINIC_OR_DEPARTMENT_OTHER): Payer: Self-pay | Admitting: Orthopaedic Surgery

## 2024-04-24 NOTE — Telephone Encounter (Signed)
 PATIENT WANTS TO HAVE SURGERY SOMEBODY MESSED UP WITH THE INSURANCE. SHE SHE GOOD WITH THE SAME SURGERY DATE

## 2024-04-24 NOTE — Telephone Encounter (Signed)
 Patient said she got she got an estimate for her surgery and she has to pay $13000 and she can not pay this. She wants to discuss other options whether is it injection or other options

## 2024-04-25 NOTE — Telephone Encounter (Signed)
 Conley spoke with the patient and advised all good to go.

## 2024-04-29 ENCOUNTER — Encounter (HOSPITAL_BASED_OUTPATIENT_CLINIC_OR_DEPARTMENT_OTHER): Payer: Self-pay | Admitting: Orthopaedic Surgery

## 2024-04-29 ENCOUNTER — Other Ambulatory Visit: Payer: Self-pay

## 2024-04-29 ENCOUNTER — Encounter (HOSPITAL_BASED_OUTPATIENT_CLINIC_OR_DEPARTMENT_OTHER)
Admission: RE | Disposition: A | Discharge status: Home / Self Care | Payer: Self-pay | Source: Home / Self Care | Attending: Orthopaedic Surgery

## 2024-04-29 ENCOUNTER — Encounter (HOSPITAL_BASED_OUTPATIENT_CLINIC_OR_DEPARTMENT_OTHER): Payer: Self-pay | Admitting: Certified Registered Nurse Anesthetist

## 2024-04-29 ENCOUNTER — Ambulatory Visit (HOSPITAL_BASED_OUTPATIENT_CLINIC_OR_DEPARTMENT_OTHER)
Admission: RE | Admit: 2024-04-29 | Discharge: 2024-04-29 | Disposition: A | Discharge status: Home / Self Care | Payer: Self-pay | Attending: Orthopaedic Surgery | Admitting: Orthopaedic Surgery

## 2024-04-29 ENCOUNTER — Ambulatory Visit (HOSPITAL_COMMUNITY)

## 2024-04-29 ENCOUNTER — Ambulatory Visit (HOSPITAL_BASED_OUTPATIENT_CLINIC_OR_DEPARTMENT_OTHER): Payer: Self-pay | Admitting: Certified Registered Nurse Anesthetist

## 2024-04-29 DIAGNOSIS — X58XXXA Exposure to other specified factors, initial encounter: Secondary | ICD-10-CM | POA: Insufficient documentation

## 2024-04-29 DIAGNOSIS — Z01818 Encounter for other preprocedural examination: Secondary | ICD-10-CM

## 2024-04-29 DIAGNOSIS — M069 Rheumatoid arthritis, unspecified: Secondary | ICD-10-CM | POA: Diagnosis not present

## 2024-04-29 DIAGNOSIS — Z96642 Presence of left artificial hip joint: Secondary | ICD-10-CM | POA: Diagnosis not present

## 2024-04-29 DIAGNOSIS — Z8261 Family history of arthritis: Secondary | ICD-10-CM | POA: Diagnosis not present

## 2024-04-29 DIAGNOSIS — S73192A Other sprain of left hip, initial encounter: Secondary | ICD-10-CM | POA: Diagnosis not present

## 2024-04-29 HISTORY — DX: Anxiety disorder, unspecified: F41.9

## 2024-04-29 SURGERY — ARTHROSCOPY, HIP, WITH LABRUM REPAIR
Anesthesia: General | Laterality: Left

## 2024-04-29 MED ORDER — PROPOFOL 500 MG/50ML IV EMUL
INTRAVENOUS | Status: AC
  Filled 2024-04-29: qty 50

## 2024-04-29 MED ORDER — GABAPENTIN 300 MG PO CAPS
300.0000 mg | ORAL_CAPSULE | Freq: Once | ORAL | Status: AC
  Administered 2024-04-29: 300 mg via ORAL

## 2024-04-29 MED ORDER — GLYCOPYRROLATE 0.2 MG/ML IJ SOLN
INTRAMUSCULAR | Status: DC | PRN
  Administered 2024-04-29: .1 mg via INTRAVENOUS

## 2024-04-29 MED ORDER — TRANEXAMIC ACID-NACL 1000-0.7 MG/100ML-% IV SOLN
INTRAVENOUS | Status: AC
Start: 2024-04-29 — End: 2024-04-29
  Filled 2024-04-29: qty 100

## 2024-04-29 MED ORDER — ONDANSETRON HCL 4 MG/2ML IJ SOLN: 4.0000 mg | Freq: Once | INTRAMUSCULAR | Status: DC | PRN

## 2024-04-29 MED ORDER — CEFAZOLIN SODIUM-DEXTROSE 2-4 GM/100ML-% IV SOLN
2.0000 g | INTRAVENOUS | Status: AC
  Administered 2024-04-29: 2 g via INTRAVENOUS

## 2024-04-29 MED ORDER — DROPERIDOL 2.5 MG/ML IJ SOLN: 0.6250 mg | Freq: Once | INTRAMUSCULAR | Status: DC | PRN

## 2024-04-29 MED ORDER — ROCURONIUM BROMIDE 100 MG/10ML IV SOLN
INTRAVENOUS | Status: DC | PRN
Start: 2024-04-29 — End: 2024-04-29
  Administered 2024-04-29: 50 mg via INTRAVENOUS

## 2024-04-29 MED ORDER — OXYCODONE HCL 5 MG PO TABS: 5.0000 mg | ORAL_TABLET | Freq: Once | ORAL | Status: DC | PRN

## 2024-04-29 MED ORDER — OXYCODONE HCL 5 MG PO TABS: 5.0000 mg | ORAL_TABLET | ORAL | 0 refills | Status: AC | PRN

## 2024-04-29 MED ORDER — HYDROMORPHONE HCL 1 MG/ML IJ SOLN
INTRAMUSCULAR | Status: AC
Start: 2024-04-29 — End: 2024-04-29
  Filled 2024-04-29: qty 0.5

## 2024-04-29 MED ORDER — GLYCOPYRROLATE PF 0.2 MG/ML IJ SOSY
PREFILLED_SYRINGE | INTRAMUSCULAR | Status: AC
Start: 2024-04-29 — End: 2024-04-29
  Filled 2024-04-29: qty 1

## 2024-04-29 MED ORDER — LIDOCAINE HCL (CARDIAC) PF 100 MG/5ML IV SOSY
PREFILLED_SYRINGE | INTRAVENOUS | Status: DC | PRN
  Administered 2024-04-29: 80 mg via INTRATRACHEAL

## 2024-04-29 MED ORDER — SODIUM CHLORIDE 0.9 % IR SOLN
Status: DC | PRN
  Administered 2024-04-29: 6000 mL

## 2024-04-29 MED ORDER — MIDAZOLAM HCL 2 MG/2ML IJ SOLN
INTRAMUSCULAR | Status: AC
  Filled 2024-04-29: qty 2

## 2024-04-29 MED ORDER — ROCURONIUM BROMIDE 10 MG/ML (PF) SYRINGE
PREFILLED_SYRINGE | INTRAVENOUS | Status: AC
  Filled 2024-04-29: qty 10

## 2024-04-29 MED ORDER — ACETAMINOPHEN 500 MG PO TABS
ORAL_TABLET | ORAL | Status: AC
  Filled 2024-04-29: qty 2

## 2024-04-29 MED ORDER — ARTIFICIAL TEARS OPHTHALMIC OINT
TOPICAL_OINTMENT | OPHTHALMIC | Status: AC
  Filled 2024-04-29: qty 3.5

## 2024-04-29 MED ORDER — KETOROLAC TROMETHAMINE 15 MG/ML IJ SOLN
INTRAMUSCULAR | Status: AC
  Filled 2024-04-29: qty 1

## 2024-04-29 MED ORDER — ONDANSETRON HCL 4 MG/2ML IJ SOLN
INTRAMUSCULAR | Status: AC
  Filled 2024-04-29: qty 2

## 2024-04-29 MED ORDER — HYDROMORPHONE HCL 1 MG/ML IJ SOLN
0.5000 mg | INTRAMUSCULAR | Status: DC | PRN
  Administered 2024-04-29: 0.5 mg via INTRAVENOUS

## 2024-04-29 MED ORDER — LACTATED RINGERS IV SOLN: INTRAVENOUS | Status: DC

## 2024-04-29 MED ORDER — FENTANYL CITRATE (PF) 100 MCG/2ML IJ SOLN
INTRAMUSCULAR | Status: AC
  Filled 2024-04-29: qty 2

## 2024-04-29 MED ORDER — TRANEXAMIC ACID-NACL 1000-0.7 MG/100ML-% IV SOLN
1000.0000 mg | INTRAVENOUS | Status: AC
  Administered 2024-04-29: 1000 mg via INTRAVENOUS

## 2024-04-29 MED ORDER — BUPIVACAINE HCL (PF) 0.25 % IJ SOLN
INTRAMUSCULAR | Status: AC
  Filled 2024-04-29: qty 90

## 2024-04-29 MED ORDER — ONDANSETRON HCL 4 MG/2ML IJ SOLN
INTRAMUSCULAR | Status: DC | PRN
Start: 2024-04-29 — End: 2024-04-29
  Administered 2024-04-29: 4 mg via INTRAVENOUS

## 2024-04-29 MED ORDER — KETOROLAC TROMETHAMINE 15 MG/ML IJ SOLN
15.0000 mg | Freq: Once | INTRAMUSCULAR | Status: AC
  Administered 2024-04-29: 15 mg via INTRAVENOUS

## 2024-04-29 MED ORDER — MIDAZOLAM HCL 2 MG/2ML IJ SOLN
INTRAMUSCULAR | Status: DC | PRN
  Administered 2024-04-29: 2 mg via INTRAVENOUS

## 2024-04-29 MED ORDER — BUPIVACAINE HCL 0.25 % IJ SOLN
INTRAMUSCULAR | Status: DC | PRN
Start: 2024-04-29 — End: 2024-04-29
  Administered 2024-04-29: 20 mL

## 2024-04-29 MED ORDER — CEFAZOLIN SODIUM-DEXTROSE 2-4 GM/100ML-% IV SOLN
INTRAVENOUS | Status: AC
  Filled 2024-04-29: qty 100

## 2024-04-29 MED ORDER — ACETAMINOPHEN 500 MG PO TABS: 1000.0000 mg | ORAL_TABLET | Freq: Once | ORAL | Status: DC

## 2024-04-29 MED ORDER — PROPOFOL 10 MG/ML IV BOLUS
INTRAVENOUS | Status: AC
  Filled 2024-04-29: qty 20

## 2024-04-29 MED ORDER — FENTANYL CITRATE (PF) 100 MCG/2ML IJ SOLN
25.0000 ug | INTRAMUSCULAR | Status: DC | PRN
  Administered 2024-04-29 (×4): 50 ug via INTRAVENOUS

## 2024-04-29 MED ORDER — LIDOCAINE 2% (20 MG/ML) 5 ML SYRINGE
INTRAMUSCULAR | Status: AC
  Filled 2024-04-29: qty 5

## 2024-04-29 MED ORDER — GABAPENTIN 300 MG PO CAPS
ORAL_CAPSULE | ORAL | Status: AC
  Filled 2024-04-29: qty 1

## 2024-04-29 MED ORDER — DEXAMETHASONE SODIUM PHOSPHATE 10 MG/ML IJ SOLN
INTRAMUSCULAR | Status: AC
Start: 2024-04-29 — End: 2024-04-29
  Filled 2024-04-29: qty 1

## 2024-04-29 MED ORDER — SUGAMMADEX SODIUM 200 MG/2ML IV SOLN
INTRAVENOUS | Status: DC | PRN
Start: 2024-04-29 — End: 2024-04-29
  Administered 2024-04-29: 175 mg via INTRAVENOUS

## 2024-04-29 MED ORDER — ACETAMINOPHEN 500 MG PO TABS
1000.0000 mg | ORAL_TABLET | Freq: Once | ORAL | Status: AC
  Administered 2024-04-29: 1000 mg via ORAL

## 2024-04-29 MED ORDER — ARTIFICIAL TEARS OPHTHALMIC OINT
TOPICAL_OINTMENT | OPHTHALMIC | Status: DC | PRN
  Administered 2024-04-29: 1 via OPHTHALMIC

## 2024-04-29 MED ORDER — OXYCODONE HCL 5 MG/5ML PO SOLN: 5.0000 mg | Freq: Once | ORAL | Status: DC | PRN

## 2024-04-29 MED ORDER — DEXAMETHASONE SODIUM PHOSPHATE 10 MG/ML IJ SOLN
INTRAMUSCULAR | Status: DC | PRN
Start: 2024-04-29 — End: 2024-04-29
  Administered 2024-04-29: 5 mg via INTRAVENOUS

## 2024-04-29 MED ORDER — PROPOFOL 500 MG/50ML IV EMUL
INTRAVENOUS | Status: DC | PRN
  Administered 2024-04-29: 25 ug/kg/min via INTRAVENOUS

## 2024-04-29 MED ORDER — PROPOFOL 10 MG/ML IV BOLUS
INTRAVENOUS | Status: DC | PRN
  Administered 2024-04-29: 50 mg via INTRAVENOUS
  Administered 2024-04-29: 150 mg via INTRAVENOUS

## 2024-04-29 MED ORDER — FENTANYL CITRATE (PF) 100 MCG/2ML IJ SOLN
INTRAMUSCULAR | Status: DC | PRN
  Administered 2024-04-29: 100 ug via INTRAVENOUS

## 2024-04-29 SURGICAL SUPPLY — 55 items
ANCHOR SUT 1.4 FLEX (Anchor) IMPLANT
BIT DRILL FLEX NANOTACK (BIT) IMPLANT
BLADE SAMURAI STR FULL RADIUS (BLADE) IMPLANT
BLADE SURG 11 STRL SS (BLADE) ×1 IMPLANT
CANISTER SUCT 1200ML W/VALVE (MISCELLANEOUS) ×1 IMPLANT
CANNULA OBTURATOR FLOWPORT ST5 (CANNULA) IMPLANT
CHLORAPREP W/TINT 26 (MISCELLANEOUS) ×1 IMPLANT
COOLER ICEMAN CLASSIC (MISCELLANEOUS) ×1 IMPLANT
COVER BACK TABLE 60X90IN (DRAPES) ×1 IMPLANT
COVER MAYO STAND STRL (DRAPES) ×2 IMPLANT
DERMABOND ADVANCED .7 DNX12 (GAUZE/BANDAGES/DRESSINGS) IMPLANT
DISSECTOR 4.2MMX19CM HL (MISCELLANEOUS) ×1 IMPLANT
DRAPE C-ARM 42X72 X-RAY (DRAPES) ×1 IMPLANT
DRAPE STERI IOBAN 125X83 (DRAPES) IMPLANT
DRAPE U-SHAPE 47X51 STRL (DRAPES) ×2 IMPLANT
DRSG TEGADERM 4X4.75 (GAUZE/BANDAGES/DRESSINGS) ×3 IMPLANT
FEE RENTAL EQUIP HIP INSTR KIT (INSTRUMENTS) IMPLANT
GAUZE PAD ABD 8X10 STRL (GAUZE/BANDAGES/DRESSINGS) IMPLANT
GAUZE SPONGE 4X4 12PLY STRL (GAUZE/BANDAGES/DRESSINGS) ×1 IMPLANT
GAUZE XEROFORM 1X8 LF (GAUZE/BANDAGES/DRESSINGS) ×1 IMPLANT
GLOVE BIO SURGEON STRL SZ 6 (GLOVE) ×2 IMPLANT
GLOVE BIO SURGEON STRL SZ7.5 (GLOVE) ×2 IMPLANT
GLOVE BIOGEL PI IND STRL 6.5 (GLOVE) ×1 IMPLANT
GLOVE BIOGEL PI IND STRL 8 (GLOVE) ×1 IMPLANT
GOWN STRL REUS W/ TWL LRG LVL3 (GOWN DISPOSABLE) ×2 IMPLANT
GOWN STRL REUS W/TWL XL LVL3 (GOWN DISPOSABLE) ×1 IMPLANT
INSTRUMENT ORTHO TEXT HIP FEM (INSTRUMENTS) IMPLANT
KIT PATIENT POSITION SMALL (KITS) IMPLANT
KIT PORTAL ENTRY HIP ACCESS (KITS) IMPLANT
MANIFOLD NEPTUNE II (INSTRUMENTS) ×1 IMPLANT
NDL HYPO 22X1.5 SAFETY MO (MISCELLANEOUS) IMPLANT
NDL INJECTOR II CARTRIDGE (MISCELLANEOUS) IMPLANT
NDL SPNL 18GX3.5 QUINCKE PK (NEEDLE) ×1 IMPLANT
NDL SUT 6 .5 CRC .975X.05 MAYO (NEEDLE) IMPLANT
NEEDLE HYPO 22X1.5 SAFETY MO (MISCELLANEOUS) IMPLANT
NEEDLE INJECTOR II CARTRIDGE (MISCELLANEOUS) ×1 IMPLANT
NEEDLE SPNL 18GX3.5 QUINCKE PK (NEEDLE) IMPLANT
PACK BASIN DAY SURGERY FS (CUSTOM PROCEDURE TRAY) ×1 IMPLANT
PAD COLD SHLDR WRAP-ON (PAD) ×1 IMPLANT
PASSER SUT 1.5D CRESCENT (INSTRUMENTS) IMPLANT
SPIKE FLUID TRANSFER (MISCELLANEOUS) IMPLANT
SPONGE T-LAP 18X18 ~~LOC~~+RFID (SPONGE) IMPLANT
SUCTION TUBE FRAZIER 10FR DISP (SUCTIONS) IMPLANT
SUT ETHILON 3 0 PS 1 (SUTURE) ×1 IMPLANT
SUT VIC AB 0 CT1 27XBRD ANBCTR (SUTURE) IMPLANT
SUT VIC AB 2-0 CT1 TAPERPNT 27 (SUTURE) IMPLANT
SUT XBRAID 1.4 BLUE/BLACK (SUTURE) IMPLANT
SUTURE FIBERWR #2 38 T-5 BLUE (SUTURE) IMPLANT
SUTURE TAPE 1.3 FIBERLOP 20 ST (SUTURE) IMPLANT
SYR 20ML LL LF (SYRINGE) IMPLANT
SYR 50ML LL SCALE MARK (SYRINGE) ×1 IMPLANT
TOWEL GREEN STERILE FF (TOWEL DISPOSABLE) ×2 IMPLANT
TUBE CONNECTING 20X1/4 (TUBING) ×3 IMPLANT
TUBING ARTHROSCOPY IRRIG 16FT (MISCELLANEOUS) ×1 IMPLANT
WAND APOLLO RF 50D ABLATOR (BUR) ×1 IMPLANT

## 2024-04-29 NOTE — Op Note (Signed)
 Date of Surgery: 04/29/2024  INDICATIONS: [name] is a 63 y.o.-year-old female with left hip labral tear.  The risk and benefits of the procedure were discussed in detail and documented in the pre-operative evaluation.   PREOPERATIVE DIAGNOSIS: 1. Left hip labral tear  POSTOPERATIVE DIAGNOSIS: Same.  PROCEDURE: 1. Left hip labral repair  SURGEON: Elspeth LITTIE Parker MD  ASSISTANT: Conley Dawson, ATC  ANESTHESIA:  general  IV FLUIDS AND URINE: See anesthesia record.  ANTIBIOTICS: Ancef   ESTIMATED BLOOD LOSS: 5 mL.  IMPLANTS:  Implant Name Type Inv. Item Serial No. Manufacturer Lot No. LRB No. Used Action  ANCHOR SUT 1.4 FLEX - [id number] Anchor ANCHOR SUT 1.4 FLEX  [employer] [id number] Left 2 Implanted    DRAINS: None  CULTURES: None  COMPLICATIONS: none  DESCRIPTION OF PROCEDURE:  Cartilage Intact femoral and acetabular cartilage   Labrum Normplastic, lipstick sign appearing   Boundaries of labral tear Convention (3 o'clock anterior, 9 o'clock posterior) Anterior boundary: 3 o'clock Posterior boundary: 1 o'clock   OPERATIVE REPORT:  The patient was brought to the operating room, placed supine on the operating table, and bony prominences were padded.  The traction boots were applied with padding to ensure that safe traction could be applied through the feet.  The contralateral limb was abducted maximally and light traction was applied.  The operative leg was brought into neutral position.  The flouroscopic c-arm was brought between the legs for an AP image.  The patient was prepped and draped in a sterile fashion.  Time-out was performed and landmarks were identified. Traction was obtained and care was taken to ensure the least amount of force necessary to allow safe access to the joint of 8-77mm.  This was checked with fluoroscopy.    Next we placed an anterolateral portal under the assistance of fluoroscopy.  First, fluoroscopy was used to estimate the  trajectory and starting point.  A 5mm incision with a #11 blade was made and a straight hemostat was used to dilate the portal through the appropriate tract.  We then placed a 14-gauge hypodermic needle with careful technique to be as close to the femoral head as possible and parallel to the sorcele to ensure no iatrogenic damage to the labrum.  This released the negative pressure environment and the amount of traction was adjusted to maintain the 8-15mm of distraction.  A nitinol wire was placed through the needle and flouroscopy was used to ensure it extended to the medial wall of the acetabulum.  The Flowport from [employer] was placed over the wire and the nitinol wire was retracted to just inside the capsule during insertion of the dilator and cannula to minimize the risk of breakage. The arthroscope was placed next and we visualized the anterior triangle.     We then placed the anterior portal under direct visualization using the technique described above.  This was safely placed as well without damage to the labrum or femoral head.  We then switched our arthroscope to the anterior portal to ensure we were not through the labrum - we were safely through the capsule only.  We then proceeded with periportal capsulotomies utilizing the Samurai blade in each portal without connecting the two.  We identified the anterior inferior iliac spine proximally, the psoas tendon medially and the rectus tendon laterally as landmarks.  We then proceeded with a diagnostic arthroscopy - the results can be found in the findings section above.    We then used the  radiofrequency device to clear the superior acetabulum and expose the subspinous region.  Next we exposed the acetabular rim leaving the chondral labral junction intact. The acetabular rim/subspinous region was reshaped with 5.5 mm bur.   When adequate reshaping was obtained we then proceeded with the labral repair. We placed 2 anchors at the 1:00 and  2:30 positions. The sutures were passed using the crescent Nanopass from [employer].  This resulted in anatomic labral repair.  We debrided the loose cartilage at the rim and residual degenerative labral tissue.  Traction was let down with total traction time of 25 minutes.     Finally, we performed a complete capsular closure with tape suture.  She was replaced in the anterior and posterior limb of the reported capsulotomy with excellent apposition. We then removed the arthroscope and closed the incisions with 3-0 nylon simple stitches.  A sterile dressing was applied..  The patient was awakened from anesthesia and transferred to PACU in stable condition. Postoperative care includes:       POSTOPERATIVE PLAN:    Weight bearing as tolerated operative extremity Formal physical therapy will begin immediately within the first weeks of surgery ASA 325 Daily for DVT prophylaxis      Elspeth LITTIE Parker, MD 8:30 AM

## 2024-04-29 NOTE — Anesthesia Postprocedure Evaluation (Signed)
 Anesthesia Post Note  Patient: [name]  Procedure(s) Performed: ARTHROSCOPY, HIP, WITH LABRUM REPAIR (Left)     Patient location during evaluation: PACU Anesthesia Type: General Level of consciousness: awake and alert Pain management: pain level controlled Vital Signs Assessment: post-procedure vital signs reviewed and stable Respiratory status: spontaneous breathing, nonlabored ventilation, respiratory function stable and patient connected to nasal cannula oxygen Cardiovascular status: blood pressure returned to baseline and stable Postop Assessment: no apparent nausea or vomiting Anesthetic complications: no   No notable events documented.  Last Vitals:  Vitals:   04/29/24 0930 04/29/24 0945  BP: 101/64 101/68  Pulse: (!) 56 (!) 53  Resp: (!) 9 14  Temp:    SpO2: 94% 97%    Last Pain:  Vitals:   04/29/24 0945  TempSrc:   PainSc: 7                  Rome Ade

## 2024-04-29 NOTE — Brief Op Note (Signed)
   Brief Op Note  Date of Surgery: 04/29/2024  Preoperative Diagnosis: LEFT HIP LABRAL TEAR  Postoperative Diagnosis: same  Procedure: Procedure(s): ARTHROSCOPY, HIP, WITH LABRUM REPAIR  Implants: Implant Name Type Inv. Item Serial No. Manufacturer Lot No. LRB No. Used Action  ANCHOR SUT 1.4 FLEX - [id number] Anchor ANCHOR SUT 1.4 FLEX  [employer] [id number] Left 2 Implanted    Surgeons: Surgeon(s): Genelle Standing, MD  Anesthesia: General    Estimated Blood Loss: See anesthesia record  Complications: None  Condition to PACU: Stable  Standing LITTIE Genelle, MD 04/29/2024 8:30 AM

## 2024-04-29 NOTE — Anesthesia Preprocedure Evaluation (Signed)
 Anesthesia Evaluation  Patient identified by MRN, date of birth, ID band Patient awake    Reviewed: Allergy & Precautions, NPO status , Patient's Chart, lab work & pertinent test results  History of Anesthesia Complications Negative for: history of anesthetic complications  Airway Mallampati: II  TM Distance: >3 FB Neck ROM: Full    Dental no notable dental hx. (+) Teeth Intact   Pulmonary asthma , neg sleep apnea, neg COPD, Patient abstained from smoking.Not current smoker   Pulmonary exam normal breath sounds clear to auscultation       Cardiovascular Exercise Tolerance: Good METS(-) hypertension(-) CAD and (-) Past MI negative cardio ROS (-) dysrhythmias  Rhythm:Regular Rate:Normal - Systolic murmurs    Neuro/Psych  PSYCHIATRIC DISORDERS Anxiety     negative neurological ROS     GI/Hepatic ,neg GERD  ,,(+)     (-) substance abuse    Endo/Other  neg diabetes    Renal/GU negative Renal ROS     Musculoskeletal  (+) Arthritis , Rheumatoid disorders,    Abdominal  (+) + obese  Peds  Hematology   Anesthesia Other Findings Past Medical History: No date: Anxiety No date: Arthritis     Comment:  rheumatoid per pt  Reproductive/Obstetrics                              Anesthesia Physical Anesthesia Plan  ASA: 2  Anesthesia Plan: General   Post-op Pain Management: Tylenol  PO (pre-op)* and Gabapentin  PO (pre-op)*   Induction: Intravenous  PONV Risk Score and Plan: 3 and Ondansetron , Dexamethasone  and Midazolam   Airway Management Planned: Oral ETT  Additional Equipment: None  Intra-op Plan:   Post-operative Plan: Extubation in OR  Informed Consent: I have reviewed the patients History and Physical, chart, labs and discussed the procedure including the risks, benefits and alternatives for the proposed anesthesia with the patient or authorized representative who has indicated  his/her understanding and acceptance.     Dental advisory given  Plan Discussed with: CRNA and Surgeon  Anesthesia Plan Comments: (Discussed risks of anesthesia with patient, including PONV, sore throat, lip/dental/eye damage. Rare risks discussed as well, such as cardiorespiratory and neurological sequelae, and allergic reactions. Discussed the role of CRNA in patient's perioperative care. Patient understands.)        Anesthesia Quick Evaluation

## 2024-04-29 NOTE — Discharge Instructions (Addendum)
 Discharge Instructions    Attending Surgeon: Elspeth Parker, MD Office Phone Number: [phone]   Diagnosis and Procedures:    Surgeries Performed: Left hip labral repair  Discharge Plan:    Diet: Resume usual diet. Begin with light or bland foods.  Drink plenty of fluids.  Activity:  Weight bearing as tolerated with walker left leg. You are advised to go home directly from the hospital or surgical center. Restrict your activities.  GENERAL INSTRUCTIONS: 1.  Please apply ice to your wound to help with swelling and inflammation. This will improve your comfort and your overall recovery following surgery.     2. Please call Dr. Danetta office at [phone] with questions Monday-Friday during business hours. If no one answers, please leave a message and someone should get back to the patient within 24 hours. For emergencies please call 911 or proceed to the emergency room.   3. Patient to notify surgical team if experiences any of the following: Bowel/Bladder dysfunction, uncontrolled pain, nerve/muscle weakness, incision with increased drainage or redness, nausea/vomiting and Fever greater than 101.0 F.  Be alert for signs of infection including redness, streaking, odor, fever or chills. Be alert for excessive pain or bleeding and notify your surgeon immediately.  WOUND INSTRUCTIONS:   Leave your dressing, cast, or splint in place until your post operative visit.  Keep it clean and dry.  Always keep the incision clean and dry until the staples/sutures are removed. If there is no drainage from the incision you should keep it open to air. If there is drainage from the incision you must keep it covered at all times until the drainage stops  Do not soak in a bath tub, hot tub, pool, lake or other body of water until 21 days after your surgery and your incision is completely dry and healed.  If you have removable sutures (or staples) they must be removed 10-14 days (unless  otherwise instructed) from the day of your surgery.     1)  Elevate the extremity as much as possible.  2)  Keep the dressing clean and dry.  3)  Please call us  if the dressing becomes wet or dirty.  4)  If you are experiencing worsening pain or worsening swelling, please call.     MEDICATIONS: Resume all previous home medications at the previous prescribed dose and frequency unless otherwise noted Start taking the  pain medications on an as-needed basis as prescribed  Please taper down pain medication over the next week following surgery.  Ideally you should not require a refill of any narcotic pain medication.  Take pain medication with food to minimize nausea. In addition to the prescribed pain medication, you may take over-the-counter pain relievers such as Tylenol .  Do NOT take additional tylenol  if your pain medication already has tylenol  in it.  Aspirin  325mg  daily per instructions on bottle. Narcotic policy: Per [facility] clinic policy, our goal is ensure optimal postoperative pain control with a multimodal pain management strategy. For all [facility] patients, our goal is to wean post-operative narcotic medications by 6 weeks post-operatively, and many times sooner. If this is not possible due to utilization of pain medication prior to surgery, your [facility] doctor will support your acute post-operative pain control for the first 6 weeks postoperatively, with a plan to transition you back to your primary pain team following that. [facility] will work to ensure a Therapist, occupational.       FOLLOWUP INSTRUCTIONS: 1. Follow up at the  Physical Therapy Clinic 3-4 days following surgery. This appointment should be scheduled unless other arrangements have been made.The Physical Therapy scheduling number is [phone] if an appointment has not already been arranged.  2. Contact Dr. Danetta office during office hours at [phone] or the practice after hours line at [phone] for  non-emergencies. For medical emergencies call 911.   Discharge Location: Home  May take tylenol  after 12:45p. May take next Oxycodone  after 2pm as needed.    Post Anesthesia Home Care Instructions  Activity: Get plenty of rest for the remainder of the day. A responsible individual must stay with you for 24 hours following the procedure.  For the next 24 hours, DO NOT: -Drive a car -Advertising copywriter -Drink alcoholic beverages -Take any medication unless instructed by your physician -Make any legal decisions or sign important papers.  Meals: Start with liquid foods such as gelatin or soup. Progress to regular foods as tolerated. Avoid greasy, spicy, heavy foods. If nausea and/or vomiting occur, drink only clear liquids until the nausea and/or vomiting subsides. Call your physician if vomiting continues.  Special Instructions/Symptoms: Your throat may feel dry or sore from the anesthesia or the breathing tube placed in your throat during surgery. If this causes discomfort, gargle with warm salt water. The discomfort should disappear within 24 hours.  If you had a scopolamine patch placed behind your ear for the management of post- operative nausea and/or vomiting:  1. The medication in the patch is effective for 72 hours, after which it should be removed.  Wrap patch in a tissue and discard in the trash. Wash hands thoroughly with soap and water. 2. You may remove the patch earlier than 72 hours if you experience unpleasant side effects which may include dry mouth, dizziness or visual disturbances. 3. Avoid touching the patch. Wash your hands with soap and water after contact with the patch.

## 2024-04-29 NOTE — Anesthesia Procedure Notes (Signed)
 Procedure Name: Intubation Date/Time: 04/29/2024 7:30 AM  Performed by: Edith Elsie Lloyd, CRNAPre-anesthesia Checklist: Patient identified, Emergency Drugs available, Suction available and Patient being monitored Patient Re-evaluated:Patient Re-evaluated prior to induction Oxygen Delivery Method: Circle system utilized Preoxygenation: Pre-oxygenation with 100% oxygen Induction Type: IV induction Ventilation: Oral airway inserted - appropriate to patient size Laryngoscope Size: Mac and 4 Grade View: Grade I Tube type: Oral Tube size: 7.0 mm Number of attempts: 1 Airway Equipment and Method: Stylet Placement Confirmation: ETT inserted through vocal cords under direct vision, positive ETCO2 and breath sounds checked- equal and bilateral Secured at: 21 cm Tube secured with: Tape Dental Injury: Teeth and Oropharynx as per pre-operative assessment

## 2024-04-29 NOTE — Transfer of Care (Signed)
 Immediate Anesthesia Transfer of Care Note  Patient: [name]  Procedure(s) Performed: ARTHROSCOPY, HIP, WITH LABRUM REPAIR (Left)  Patient Location: PACU  Anesthesia Type:General  Level of Consciousness: awake and drowsy  Airway & Oxygen Therapy: Patient Spontanous Breathing and Patient connected to face mask oxygen  Post-op Assessment: Report given to RN, Patient moving all extremities X 4, and Patient able to stick tongue midline  Post vital signs: Reviewed and stable  Last Vitals:  Vitals Value Taken Time  BP 106/74 04/29/24 08:41  Temp    Pulse 54 04/29/24 08:42  Resp 8 04/29/24 08:42  SpO2 98 % 04/29/24 08:42  Vitals shown include unfiled device data.  Last Pain:  Vitals:   04/29/24 0642  TempSrc: Temporal  PainSc: 8       Patients Stated Pain Goal: 5 (04/29/24 9357)  Complications: No notable events documented.

## 2024-04-29 NOTE — H&P (Signed)
 Post Operative Evaluation      Procedure/Date of Surgery: Status post left gluteus medius repair 9/3   Interval History:    Presents today for follow-up as she is having more groin based left hip pain.  She is here today for MRI discussion.  She has remained symptomatic.   PMH/PSH/Family History/Social History/Meds/Allergies:         Past Medical History:  Diagnosis Date   Arthritis      rheumatoid per pt             Past Surgical History:  Procedure Laterality Date   ABDOMINAL HYSTERECTOMY   2001    TAH/BSO fibroids   CATARACT EXTRACTION Right     COLONOSCOPY   04/27/2011   GLUTEUS MINIMUS REPAIR Left 05/08/2023    Procedure: LEFT GLUTEUS MEDIUS  REPAIR  COLLAGEN PATCH;  Surgeon: Genelle Standing, MD;  Location: [facility];  Service: Orthopedics;  Laterality: Left;   obstructed bile duct   2004    Dr Ludwig   OOPHORECTOMY        BSO   REVERSE SHOULDER ARTHROPLASTY Left 02/14/2022    Procedure: LEFT REVERSE SHOULDER ARTHROPLASTY;  Surgeon: Genelle Standing, MD;  Location: [facility];  Service: Orthopedics;  Laterality: Left;   ROTATOR CUFF REPAIR Bilateral 12/2010    Left and right        Social History         Socioeconomic History   Marital status: Single      Spouse name: Not on file   Number of children: 1   Years of education: Not on file   Highest education level: Not on file  Occupational History   Occupation: MEAT CUTTER      Employer: [employer]  Tobacco Use   Smoking status: Never   Smokeless tobacco: Never  Vaping Use   Vaping status: Never Used  Substance and Sexual Activity   Alcohol use: Yes      Alcohol/week: 0.0 standard drinks of alcohol      Comment: Occas. wine   Drug use: No   Sexual activity: Not Currently      Partners: Male      Birth control/protection: Surgical      Comment: 1st intercourse 63 yo-More than 5 partners,hysterectomy  Other Topics Concern   Not on file  Social History Narrative     Exercise-- water aerobics    Social Drivers of Health    Financial Resource Strain: Not on file  Food Insecurity: Not on file  Transportation Needs: Not on file  Physical Activity: Not on file  Stress: Not on file  Social Connections: Not on file         Family History  Problem Relation Age of Onset   Heart disease Mother 80        MI   Stroke Mother     Hypertension Mother     Arthritis Mother     Pulmonary embolism Mother     Prostate cancer Father     Hypertension Father     Dementia Father     Arthritis Sister     Seizures Sister     Colon cancer Neg Hx     Colon polyps Neg Hx     Esophageal cancer Neg Hx     Rectal cancer Neg Hx     Stomach cancer Neg Hx     Breast cancer Neg Hx  Allergies  No Known Allergies         Current Outpatient Medications  Medication Sig Dispense Refill   acetaminophen  (TYLENOL ) 500 MG tablet Take 1 tablet (500 mg total) by mouth every 8 (eight) hours for 10 days. 30 tablet 0   aspirin  EC 325 MG tablet Take 1 tablet (325 mg total) by mouth daily. 14 tablet 0   ibuprofen  (ADVIL ) 800 MG tablet Take 1 tablet (800 mg total) by mouth every 8 (eight) hours for 10 days. Please take with food, please alternate with acetaminophen  30 tablet 0   oxyCODONE  (ROXICODONE ) 5 MG immediate release tablet Take 1 tablet (5 mg total) by mouth every 4 (four) hours as needed for severe pain (pain score 7-10) or breakthrough pain. 10 tablet 0   albuterol  (VENTOLIN  HFA) 108 (90 Base) MCG/ACT inhaler Inhale 2 puffs into the lungs every 6 (six) hours as needed for wheezing or shortness of breath.       aspirin  EC 325 MG tablet Take 1 tablet (325 mg total) by mouth daily. 30 tablet 0   azelastine  (ASTELIN ) 0.1 % nasal spray Place 1 spray into both nostrils 2 (two) times daily. Use in each nostril as directed (Patient not taking: Reported on 05/11/2023) 30 mL 12   Cholecalciferol (VITAMIN D  PO) Take 1 tablet by mouth 2 (two) times a week.       cyclobenzaprine   (FLEXERIL ) 5 MG tablet Take 1 tablet (5 mg total) by mouth at bedtime. (Patient taking differently: Take 5 mg by mouth daily as needed for muscle spasms.) 30 tablet 0   hydrochlorothiazide  (HYDRODIURIL ) 25 MG tablet Take 1 tablet (25 mg total) by mouth daily. 90 tablet 3   levocetirizine (XYZAL ) 5 MG tablet Take 1 tablet (5 mg total) by mouth every evening. 30 tablet 5   LORazepam  (ATIVAN ) 1 MG tablet Take 1 tablet (1 mg total) by mouth every 8 (eight) hours. 2 tablet 0   LORazepam  (ATIVAN ) 1 MG tablet Take 1 tablet (1 mg total) by mouth every 8 (eight) hours. 2 tablet 0   methylPREDNISolone  (MEDROL  DOSEPAK) 4 MG TBPK tablet Take per packet instructions 21 each 0   oxyCODONE  (ROXICODONE ) 5 MG immediate release tablet Take 1 tablet (5 mg total) by mouth every 4 (four) hours as needed for severe pain or breakthrough pain. 15 tablet 0   traMADol  (ULTRAM ) 50 MG tablet Take 1 tablet (50 mg total) by mouth every 6 (six) hours as needed. 30 tablet 2   traMADol  (ULTRAM ) 50 MG tablet Take 1 tablet (50 mg total) by mouth every 6 (six) hours as needed. 30 tablet 2   Vitamin D , Ergocalciferol , (DRISDOL ) 1.25 MG (50000 UNIT) CAPS capsule TAKE 1 CAPSULE (50,000 UNITS TOTAL) BY MOUTH EVERY 7 (SEVEN) DAYS 12 capsule 1      No current facility-administered medications for this visit.      Imaging Results (Last 48 hours)  No results found.     Review of Systems:   A ROS was performed including pertinent positives and negatives as documented in the HPI.     Musculoskeletal Exam:     There were no vitals taken for this visit.   Incision is well-appearing without erythema or drainage.  Internal extra rotation is 30 degrees with active flexion to 90 degrees of the hip.  Some weakness with resisted hip abduction distal neurovascular exam is intact   Imaging:     MRI left hip: Anterior superior labral tear without evidence of chondral  loss, no evidence of arthritis   I personally reviewed and interpreted  the radiographs.     Assessment:   Status post left hip gluteus medius repair with persistent left-sided hip pain which I do believe may be consistent more with hip instability and MRI does confirm labral tearing.  At this time she has extensively tried multiple months of physical therapy without any relief.  She has had injections as well without any relief.  Given this I did discuss the possibility of hip arthroscopy with labral repair.  I did discuss the risks and limitations as well as associate recovery timeframe.  After discussion she would like to proceed     Plan :     - Plan for left hip arthroscopy with labral repair     After a lengthy discussion of treatment options, including risks, benefits, alternatives, complications of surgical and nonsurgical conservative options, the patient elected surgical repair.    The patient  is aware of the material risks  and complications including, but not limited to injury to adjacent structures, neurovascular injury, infection, numbness, bleeding, implant failure, thermal burns, stiffness, persistent pain, failure to heal, disease transmission from allograft, need for further surgery, dislocation, anesthetic risks, blood clots, risks of death,and others. The probabilities of surgical success and failure discussed with patient given their particular co-morbidities.The time and nature of expected rehabilitation and recovery was discussed.The patient's questions were all answered preoperatively.  No barriers to understanding were noted. I explained the natural history of the disease process and Rx rationale.  I explained to the patient what I considered to be reasonable expectations given their personal situation.  The final treatment plan was arrived at through a shared patient decision making process model.               I personally saw and evaluated the patient, and participated in the management and treatment plan.   Elspeth Parker,  MD Attending Physician, Orthopedic Surgery

## 2024-05-02 ENCOUNTER — Ambulatory Visit (HOSPITAL_BASED_OUTPATIENT_CLINIC_OR_DEPARTMENT_OTHER): Attending: Orthopaedic Surgery | Admitting: Physical Therapy

## 2024-05-02 ENCOUNTER — Encounter (HOSPITAL_BASED_OUTPATIENT_CLINIC_OR_DEPARTMENT_OTHER): Payer: Self-pay | Admitting: Physical Therapy

## 2024-05-02 ENCOUNTER — Other Ambulatory Visit: Payer: Self-pay

## 2024-05-02 DIAGNOSIS — R262 Difficulty in walking, not elsewhere classified: Secondary | ICD-10-CM | POA: Insufficient documentation

## 2024-05-02 DIAGNOSIS — M6281 Muscle weakness (generalized): Secondary | ICD-10-CM | POA: Insufficient documentation

## 2024-05-02 DIAGNOSIS — S73192A Other sprain of left hip, initial encounter: Secondary | ICD-10-CM | POA: Diagnosis not present

## 2024-05-02 DIAGNOSIS — M25552 Pain in left hip: Secondary | ICD-10-CM | POA: Insufficient documentation

## 2024-05-02 NOTE — Therapy (Signed)
 OUTPATIENT PHYSICAL THERAPY EVALUATION   Patient Name: [name] MRN: [id number] DOB:Mar 09, 1961, 63 y.o., female Today's Date: 05/02/2024  END OF SESSION:  PT End of Session - 05/02/24 0922     Visit Number 1    Number of Visits 13    Date for PT Re-Evaluation 07/26/24    Authorization Type [employer]    PT Start Time 0923    PT Stop Time 1004    PT Time Calculation (min) 41 min    Activity Tolerance Patient tolerated treatment well    Behavior During Therapy [facility] for tasks assessed/performed          Past Medical History:  Diagnosis Date   Anxiety    Arthritis    rheumatoid per pt   Past Surgical History:  Procedure Laterality Date   ABDOMINAL HYSTERECTOMY  2001   TAH/BSO fibroids   CATARACT EXTRACTION Right    COLONOSCOPY  04/27/2011   GLUTEUS MINIMUS REPAIR Left 05/08/2023   Procedure: LEFT GLUTEUS MEDIUS  REPAIR  COLLAGEN PATCH;  Surgeon: Genelle Standing, MD;  Location: [facility];  Service: Orthopedics;  Laterality: Left;   obstructed bile duct  2004   Dr Ludwig   OOPHORECTOMY     BSO   REVERSE SHOULDER ARTHROPLASTY Left 02/14/2022   Procedure: LEFT REVERSE SHOULDER ARTHROPLASTY;  Surgeon: Genelle Standing, MD;  Location: [facility];  Service: Orthopedics;  Laterality: Left;   ROTATOR CUFF REPAIR Bilateral 12/2010   Left and right   Patient Active Problem List   Diagnosis Date Noted   Tear of left acetabular labrum 04/29/2024   Vitamin D  deficiency 01/22/2024   Lower extremity edema 01/22/2024   Estrogen deficiency 01/22/2024   Mild intermittent asthma without complication 01/22/2024   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   Upper respiratory tract infection 04/06/2023   Sore throat 04/06/2023   COVID-19 09/28/2022   Right calf pain 09/28/2022   Acute cough 09/28/2022   Rotator cuff arthropathy of left shoulder    Preop examination 12/30/2021   Arthritis of carpometacarpal (CMC) joint of left thumb 12/02/2021   Lumbar radiculopathy  05/29/2017   Greater trochanteric bursitis of left hip 05/29/2017   Contusion, hip and thigh, left, initial encounter 05/29/2017   Left hip pain 04/30/2017   Abdominal pain, LLQ 04/30/2017   Back pain 02/16/2015   Obesity (BMI 30-39.9) 06/09/2013   Preventative health care 03/25/2011   OSTEOARTHRITIS 12/15/2009   IMPAIRMENT, ONE EYE MODERATE, OTHER NOS 07/01/2007   SBO 07/01/2007   HERNIA, UMBILICAL 06/11/2007    REFERRING PROVIDER: Genelle Standing, MD  REFERRING DIAG:  S73.192A (ICD-10-CM) - Tear of left acetabular labrum, initial encounter    s/p Lt hip labral repair  Rationale for Evaluation and Treatment: Rehabilitation  THERAPY DIAG:  Muscle weakness (generalized)  Difficulty in walking, not elsewhere classified  Pain in left hip  ONSET DATE: DOS 04/29/24   SUBJECTIVE:  SUBJECTIVE STATEMENT: I am trying not to bend a lot due to tightness.   PERTINENT HISTORY:  N/a  PAIN:  Are you having pain? Yes: NPRS scale: 5/10 Pain location: Lt hip Pain description: sore Aggravating factors: bending Relieving factors: rest, ice, gentle movement  PRECAUTIONS:  None  RED FLAGS: None   WEIGHT BEARING RESTRICTIONS:  WBAT  FALLS:  Has patient fallen in last 6 months? No  LIVING ENVIRONMENT: No stairs at home  OCCUPATION:  retired  PLOF:  Independent  PATIENT GOALS:  Back to my walking and the gym   OBJECTIVE:  Note: Objective measures were completed at Evaluation unless otherwise noted.   PATIENT SURVEYS:  LEFS  Extreme difficulty/unable (0), Quite a bit of difficulty (1), Moderate difficulty (2), Little difficulty (3), No difficulty (4) Survey date:  8/29  Any of your usual work, housework or school activities 1  2. Usual hobbies, recreational or sporting activities  0  3. Getting into/out of the bath 3  4. Walking between rooms 2  5. Putting on socks/shoes 1  6. Squatting  1  7. Lifting an object, like a bag of groceries from the floor 1  8. Performing light activities around your home 3  9. Performing heavy activities around your home 1  10. Getting into/out of a car 1  11. Walking 2 blocks 1  12. Walking 1 mile 0  13. Going up/down 10 stairs (1 flight) 1  14. Standing for 1 hour 0  15.  sitting for 1 hour 1  16. Running on even ground 0  17. Running on uneven ground 0  18. Making sharp turns while running fast 0  19. Hopping  0  20. Rolling over in bed 1  Score total:  17/80     COGNITIVE STATUS: Within functional limits for tasks assessed   SENSATION: WFL   GAIT: Comments: EVAL  SPC, slow cadence, slightly flexed at trunk   Body Part #1 Hip  PALPATION: EVAL: no s/s of infection  LOWER EXTREMITY ROM:     Passive  Right eval Left eval  Hip flexion    Hip extension  0  Hip abduction    Hip adduction    Hip internal rotation    Hip external rotation    Knee flexion    Knee extension    Ankle dorsiflexion    Ankle plantarflexion    Ankle inversion    Ankle eversion     (Blank rows = not tested)   TREATMENT DATE:   EVAL Changed bandages See HEP for exercises Gait training- quad set at heel strike, glut set through stance phase   PATIENT EDUCATION:  Education details: Anatomy of condition, POC, HEP, exercise form/rationale Person educated: Patient Education method: Explanation, Demonstration, Tactile cues, Verbal cues, and Handouts Education comprehension: verbalized understanding, returned demonstration, verbal cues required, tactile cues required, and needs further education    HOME EXERCISE PROGRAM: Access Code: [email or web address] URL: [email or web address] Date: 05/02/2024 Prepared by: Harlene Cordon  Exercises - Lying Prone  - 3 x daily - 7 x weekly - 1 sets - 2 min hold - Prone Gluteal  Sets  - 3 x daily - 7 x weekly - 5-10 reps - Prone Knee Flexion  - 3 x daily - 7 x weekly - 5-10 reps - Supine Posterior Pelvic Tilt  - 2 x daily - 7 x weekly - 1 sets - 10 reps - Bent Knee Fallouts  - 2 x daily - 7 x weekly -  1 sets - 10 reps - Seated Hamstring Stretch  - 3 x daily - 7 x weekly - 2 sets - 5 breaths hold   ASSESSMENT:  CLINICAL IMPRESSION: Patient is a 63 y.o. F who was seen today for physical therapy evaluation and treatment for s/p Lt hip labral repair.      REHAB POTENTIAL: Good  CLINICAL DECISION MAKING: Stable/uncomplicated  EVALUATION COMPLEXITY: Low   GOALS: Goals reviewed with patient? Yes  SHORT TERM GOALS: Target date: 4 weeks 9/23  Pain free flexion to 90 Baseline: Goal status: INITIAL   2.  Demo controlled quad set with hold Baseline:  Goal status: INITIAL   3.  30s sit to stand without pain or compensation Baseline:  Goal status: INITIAL   4.  SLS 30s without compensation Baseline:  Goal status: INITIAL    LONG TERM GOALS:  Able to demo step up on 6 step with level pelvis and proper form Baseline:  Goal status: INITIAL Week 8 10/24  2.  Will tolerate at least 3 min on elliptical, demonstrating good tolerance to repetitive weight bearing motion Baseline:  Goal status: INITIAL  Week 8 10/24  3.  Demonstrate proper form in at least 10 continuous lunges without increased pain Baseline:  Goal status: INITIAL  Week 12 08/09/24  4.  Demonstrate gentle, double and single foot plyometric motions with good proximal form Baseline:  Goal status: INITIAL Week 12 12/6  5.  Back to my walking (3-4 miles) and gym program Baseline: I can barely do 30 min at the grocery store from my hip Goal status: INITIAL      PLAN:  PT FREQUENCY: 1-2x/week  PT DURATION: POC date  PLANNED INTERVENTIONS: 97164- PT Re-evaluation, 97750- Physical Performance Testing, 97110-Therapeutic exercises, 97530- Therapeutic activity, 97112- Neuromuscular  re-education, 97535- Self Care, 02859- Manual therapy, [id number]- Gait training, [phone]- Aquatic Therapy, [phone] (1-2 muscles), 20561 (3+ muscles)- Dry Needling, Patient/Family education, Balance training, Stair training, Taping, Joint mobilization, Spinal mobilization, Scar mobilization, and Cryotherapy.  PLAN FOR NEXT SESSION: gait training, glut/core strength   Harlene Cordon, PT, DPT 05/02/2024, 10:18 AM For all possible CPT codes, reference the Planned Interventions line above.     Check all conditions that are expected to impact treatment: {Conditions expected to impact treatment:None of these apply

## 2024-05-08 ENCOUNTER — Ambulatory Visit (HOSPITAL_BASED_OUTPATIENT_CLINIC_OR_DEPARTMENT_OTHER): Attending: Orthopaedic Surgery

## 2024-05-08 ENCOUNTER — Encounter (HOSPITAL_BASED_OUTPATIENT_CLINIC_OR_DEPARTMENT_OTHER): Payer: Self-pay

## 2024-05-08 DIAGNOSIS — R262 Difficulty in walking, not elsewhere classified: Secondary | ICD-10-CM | POA: Diagnosis not present

## 2024-05-08 DIAGNOSIS — M25652 Stiffness of left hip, not elsewhere classified: Secondary | ICD-10-CM | POA: Diagnosis not present

## 2024-05-08 DIAGNOSIS — M25552 Pain in left hip: Secondary | ICD-10-CM | POA: Diagnosis not present

## 2024-05-08 DIAGNOSIS — R2689 Other abnormalities of gait and mobility: Secondary | ICD-10-CM | POA: Insufficient documentation

## 2024-05-08 DIAGNOSIS — M6281 Muscle weakness (generalized): Secondary | ICD-10-CM | POA: Diagnosis not present

## 2024-05-08 NOTE — Therapy (Signed)
 OUTPATIENT PHYSICAL THERAPY EVALUATION   Patient Name: [name] MRN: [id number] DOB:06/24/1961, 64 y.o., female Today's Date: 05/08/2024  END OF SESSION:  PT End of Session - 05/08/24 0934     Visit Number 2    Number of Visits 13    Date for PT Re-Evaluation 07/26/24    Authorization Type [employer]    Authorization Time Period 05/02/24-07/01/24    Authorization - Visit Number 2    Authorization - Number of Visits 12    PT Start Time 0933    PT Stop Time 1008    PT Time Calculation (min) 35 min    Activity Tolerance Patient tolerated treatment well    Behavior During Therapy [facility] for tasks assessed/performed           Past Medical History:  Diagnosis Date   Anxiety    Arthritis    rheumatoid per pt   Past Surgical History:  Procedure Laterality Date   ABDOMINAL HYSTERECTOMY  2001   TAH/BSO fibroids   CATARACT EXTRACTION Right    COLONOSCOPY  04/27/2011   GLUTEUS MINIMUS REPAIR Left 05/08/2023   Procedure: LEFT GLUTEUS MEDIUS  REPAIR  COLLAGEN PATCH;  Surgeon: Genelle Standing, MD;  Location: [facility];  Service: Orthopedics;  Laterality: Left;   obstructed bile duct  2004   Dr Ludwig   OOPHORECTOMY     BSO   REVERSE SHOULDER ARTHROPLASTY Left 02/14/2022   Procedure: LEFT REVERSE SHOULDER ARTHROPLASTY;  Surgeon: Genelle Standing, MD;  Location: [facility];  Service: Orthopedics;  Laterality: Left;   ROTATOR CUFF REPAIR Bilateral 12/2010   Left and right   Patient Active Problem List   Diagnosis Date Noted   Tear of left acetabular labrum 04/29/2024   Vitamin D  deficiency 01/22/2024   Lower extremity edema 01/22/2024   Estrogen deficiency 01/22/2024   Mild intermittent asthma without complication 01/22/2024   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   Upper respiratory tract infection 04/06/2023   Sore throat 04/06/2023   COVID-19 09/28/2022   Right calf pain 09/28/2022   Acute cough 09/28/2022   Rotator cuff arthropathy of left shoulder     Preop examination 12/30/2021   Arthritis of carpometacarpal (CMC) joint of left thumb 12/02/2021   Lumbar radiculopathy 05/29/2017   Greater trochanteric bursitis of left hip 05/29/2017   Contusion, hip and thigh, left, initial encounter 05/29/2017   Left hip pain 04/30/2017   Abdominal pain, LLQ 04/30/2017   Back pain 02/16/2015   Obesity (BMI 30-39.9) 06/09/2013   Preventative health care 03/25/2011   OSTEOARTHRITIS 12/15/2009   IMPAIRMENT, ONE EYE MODERATE, OTHER NOS 07/01/2007   SBO 07/01/2007   HERNIA, UMBILICAL 06/11/2007    REFERRING PROVIDER: Genelle Standing, MD  REFERRING DIAG:  S73.192A (ICD-10-CM) - Tear of left acetabular labrum, initial encounter    s/p Lt hip labral repair  Rationale for Evaluation and Treatment: Rehabilitation  THERAPY DIAG:  Pain in left hip  Difficulty in walking, not elsewhere classified  Muscle weakness (generalized)  ONSET DATE: DOS 04/29/24   SUBJECTIVE:  SUBJECTIVE STATEMENT: Pt reports she has been doing well. Some pain when doing one of the exercises (demonstrates external rotation). Minimal pain at entry. Arrives with [facility].   PERTINENT HISTORY:  N/a  PAIN:  Are you having pain? Yes: NPRS scale: 2/10 Pain location: Lt hip Pain description: sore Aggravating factors: bending Relieving factors: rest, ice, gentle movement  PRECAUTIONS:  None  RED FLAGS: None   WEIGHT BEARING RESTRICTIONS:  WBAT  FALLS:  Has patient fallen in last 6 months? No  LIVING ENVIRONMENT: No stairs at home  OCCUPATION:  retired  PLOF:  Independent  PATIENT GOALS:  Back to my walking and the gym   OBJECTIVE:  Note: Objective measures were completed at Evaluation unless otherwise noted.   PATIENT SURVEYS:  LEFS  Extreme difficulty/unable (0),  Quite a bit of difficulty (1), Moderate difficulty (2), Little difficulty (3), No difficulty (4) Survey date:  8/29  Any of your usual work, housework or school activities 1  2. Usual hobbies, recreational or sporting activities 0  3. Getting into/out of the bath 3  4. Walking between rooms 2  5. Putting on socks/shoes 1  6. Squatting  1  7. Lifting an object, like a bag of groceries from the floor 1  8. Performing light activities around your home 3  9. Performing heavy activities around your home 1  10. Getting into/out of a car 1  11. Walking 2 blocks 1  12. Walking 1 mile 0  13. Going up/down 10 stairs (1 flight) 1  14. Standing for 1 hour 0  15.  sitting for 1 hour 1  16. Running on even ground 0  17. Running on uneven ground 0  18. Making sharp turns while running fast 0  19. Hopping  0  20. Rolling over in bed 1  Score total:  17/80     COGNITIVE STATUS: Within functional limits for tasks assessed   SENSATION: WFL   GAIT: Comments: EVAL  SPC, slow cadence, slightly flexed at trunk   Body Part #1 Hip  PALPATION: EVAL: no s/s of infection  LOWER EXTREMITY ROM:     Passive  Right eval Left eval  Hip flexion    Hip extension  0  Hip abduction    Hip adduction    Hip internal rotation    Hip external rotation    Knee flexion    Knee extension    Ankle dorsiflexion    Ankle plantarflexion    Ankle inversion    Ankle eversion     (Blank rows = not tested)   TREATMENT DATE:   9/4 PROM L hip  STM to lateral glutes Glute squeezes 5 2x10 In hooklying Hooklying abductor squeeze 5 2x10 PPT in hooklying 5 2x10 Gait in hall with SPC LAQ 3 2x10 Trialed s/l clam, but too painful    EVAL Changed bandages See HEP for exercises Gait training- quad set at heel strike, glut set through stance phase   PATIENT EDUCATION:  Education details: Anatomy of condition, POC, HEP, exercise form/rationale Person educated: Patient Education method:  Explanation, Demonstration, Tactile cues, Verbal cues, and Handouts Education comprehension: verbalized understanding, returned demonstration, verbal cues required, tactile cues required, and needs further education    HOME EXERCISE PROGRAM: Access Code: [email or web address] URL: [email or web address] Date: 05/02/2024 Prepared by: Harlene Cordon  Exercises - Lying Prone  - 3 x daily - 7 x weekly - 1 sets - 2 min hold - Prone Gluteal Sets  - 3 x daily -  7 x weekly - 5-10 reps - Prone Knee Flexion  - 3 x daily - 7 x weekly - 5-10 reps - Supine Posterior Pelvic Tilt  - 2 x daily - 7 x weekly - 1 sets - 10 reps - Bent Knee Fallouts  - 2 x daily - 7 x weekly - 1 sets - 10 reps - Seated Hamstring Stretch  - 3 x daily - 7 x weekly - 2 sets - 5 breaths hold   ASSESSMENT:  CLINICAL IMPRESSION: Pt demonstrates significant guarding with PROM, requiring frequent cues to decrease this. She has lateral hip tenderness/pain which has been present since prior surgery. Pt with some soreness by end of session. Educated about ice us  and symptom management. Educated pt on healing timeline and PT plan. Will continue to progress as tolerated.      REHAB POTENTIAL: Good  CLINICAL DECISION MAKING: Stable/uncomplicated  EVALUATION COMPLEXITY: Low   GOALS: Goals reviewed with patient? Yes  SHORT TERM GOALS: Target date: 4 weeks 9/23  Pain free flexion to 90 Baseline: Goal status: INITIAL   2.  Demo controlled quad set with hold Baseline:  Goal status: INITIAL   3.  30s sit to stand without pain or compensation Baseline:  Goal status: INITIAL   4.  SLS 30s without compensation Baseline:  Goal status: INITIAL    LONG TERM GOALS:  Able to demo step up on 6 step with level pelvis and proper form Baseline:  Goal status: INITIAL Week 8 10/24  2.  Will tolerate at least 3 min on elliptical, demonstrating good tolerance to repetitive weight bearing motion Baseline:  Goal status:  INITIAL  Week 8 10/24  3.  Demonstrate proper form in at least 10 continuous lunges without increased pain Baseline:  Goal status: INITIAL  Week 12 08/09/24  4.  Demonstrate gentle, double and single foot plyometric motions with good proximal form Baseline:  Goal status: INITIAL Week 12 12/6  5.  Back to my walking (3-4 miles) and gym program Baseline: I can barely do 30 min at the grocery store from my hip Goal status: INITIAL      PLAN:  PT FREQUENCY: 1-2x/week  PT DURATION: POC date  PLANNED INTERVENTIONS: 97164- PT Re-evaluation, 97750- Physical Performance Testing, 97110-Therapeutic exercises, 97530- Therapeutic activity, 97112- Neuromuscular re-education, 97535- Self Care, 02859- Manual therapy, [id number]- Gait training, [phone]- Aquatic Therapy, [phone] (1-2 muscles), 20561 (3+ muscles)- Dry Needling, Patient/Family education, Balance training, Stair training, Taping, Joint mobilization, Spinal mobilization, Scar mobilization, and Cryotherapy.  PLAN FOR NEXT SESSION: gait training, glut/core strength   [name], PTA, 05/08/2024, 11:08 AM For all possible CPT codes, reference the Planned Interventions line above.     Check all conditions that are expected to impact treatment: {Conditions expected to impact treatment:None of these apply

## 2024-05-12 ENCOUNTER — Encounter (HOSPITAL_BASED_OUTPATIENT_CLINIC_OR_DEPARTMENT_OTHER): Payer: Self-pay

## 2024-05-12 ENCOUNTER — Ambulatory Visit (HOSPITAL_BASED_OUTPATIENT_CLINIC_OR_DEPARTMENT_OTHER)

## 2024-05-12 ENCOUNTER — Ambulatory Visit (HOSPITAL_BASED_OUTPATIENT_CLINIC_OR_DEPARTMENT_OTHER): Admitting: Orthopaedic Surgery

## 2024-05-12 DIAGNOSIS — M25652 Stiffness of left hip, not elsewhere classified: Secondary | ICD-10-CM | POA: Diagnosis not present

## 2024-05-12 DIAGNOSIS — M6281 Muscle weakness (generalized): Secondary | ICD-10-CM | POA: Diagnosis not present

## 2024-05-12 DIAGNOSIS — S73192A Other sprain of left hip, initial encounter: Secondary | ICD-10-CM

## 2024-05-12 DIAGNOSIS — M25552 Pain in left hip: Secondary | ICD-10-CM

## 2024-05-12 DIAGNOSIS — R2689 Other abnormalities of gait and mobility: Secondary | ICD-10-CM | POA: Diagnosis not present

## 2024-05-12 DIAGNOSIS — R262 Difficulty in walking, not elsewhere classified: Secondary | ICD-10-CM

## 2024-05-12 NOTE — Progress Notes (Signed)
 Post Operative Evaluation    Procedure/Date of Surgery: Left hip arthroscopy with labral repair 8/26  Interval History:   Presents 2 weeks status post left hip arthroscopy with labral repair.  Overall she is doing extremely well.  She has been weightbearing with only the use of a cane but does not need this full-time.  She has been participating with physical therapy   PMH/PSH/Family History/Social History/Meds/Allergies:    Past Medical History:  Diagnosis Date   Anxiety    Arthritis    rheumatoid per pt   Past Surgical History:  Procedure Laterality Date   ABDOMINAL HYSTERECTOMY  2001   TAH/BSO fibroids   CATARACT EXTRACTION Right    COLONOSCOPY  04/27/2011   GLUTEUS MINIMUS REPAIR Left 05/08/2023   Procedure: LEFT GLUTEUS MEDIUS  REPAIR  COLLAGEN PATCH;  Surgeon: Genelle Standing, MD;  Location: [facility];  Service: Orthopedics;  Laterality: Left;   obstructed bile duct  2004   Dr Ludwig   OOPHORECTOMY     BSO   REVERSE SHOULDER ARTHROPLASTY Left 02/14/2022   Procedure: LEFT REVERSE SHOULDER ARTHROPLASTY;  Surgeon: Genelle Standing, MD;  Location: [facility];  Service: Orthopedics;  Laterality: Left;   ROTATOR CUFF REPAIR Bilateral 12/2010   Left and right   Social History   Socioeconomic History   Marital status: Single    Spouse name: Not on file   Number of children: 1   Years of education: Not on file   Highest education level: Not on file  Occupational History   Occupation: MEAT CUTTER    Employer: [employer]  Tobacco Use   Smoking status: Never   Smokeless tobacco: Never  Vaping Use   Vaping status: Never Used  Substance and Sexual Activity   Alcohol use: Yes    Alcohol/week: 0.0 standard drinks of alcohol    Comment: Occas. wine   Drug use: No   Sexual activity: Not Currently    Partners: Male    Birth control/protection: Surgical    Comment: 1st intercourse 63 yo-More than 5 partners,hysterectomy   Other Topics Concern   Not on file  Social History Narrative   Exercise-- water aerobics   Social Drivers of Health   Financial Resource Strain: Not on file  Food Insecurity: Not on file  Transportation Needs: Not on file  Physical Activity: Not on file  Stress: Not on file  Social Connections: Not on file   Family History  Problem Relation Age of Onset   Heart disease Mother 91       MI   Stroke Mother    Hypertension Mother    Arthritis Mother    Pulmonary embolism Mother    Prostate cancer Father    Hypertension Father    Dementia Father    Arthritis Sister    Seizures Sister    Colon cancer Neg Hx    Colon polyps Neg Hx    Esophageal cancer Neg Hx    Rectal cancer Neg Hx    Stomach cancer Neg Hx    Breast cancer Neg Hx    No Known Allergies Current Outpatient Medications  Medication Sig Dispense Refill   albuterol  (VENTOLIN  HFA) 108 (90 Base) MCG/ACT inhaler Inhale 2 puffs into the lungs every 6 (six) hours as needed for wheezing or shortness of  breath.     aspirin  EC 325 MG tablet Take 1 tablet (325 mg total) by mouth daily. 30 tablet 0   aspirin  EC 325 MG tablet Take 1 tablet (325 mg total) by mouth daily. 14 tablet 0   hydrochlorothiazide  (HYDRODIURIL ) 25 MG tablet Take 1 tablet (25 mg total) by mouth daily. 90 tablet 3   levocetirizine (XYZAL ) 5 MG tablet Take 1 tablet (5 mg total) by mouth every evening. 30 tablet 5   oxyCODONE  (ROXICODONE ) 5 MG immediate release tablet Take 1 tablet (5 mg total) by mouth every 4 (four) hours as needed for severe pain (pain score 7-10) or breakthrough pain. 10 tablet 0   oxyCODONE  (ROXICODONE ) 5 MG immediate release tablet Take 1 tablet (5 mg total) by mouth every 4 (four) hours as needed for severe pain (pain score 7-10) or breakthrough pain. 15 tablet 0   Vitamin D , Ergocalciferol , (DRISDOL ) 1.25 MG (50000 UNIT) CAPS capsule TAKE 1 CAPSULE (50,000 UNITS TOTAL) BY MOUTH EVERY 7 (SEVEN) DAYS 12 capsule 1   No current  facility-administered medications for this visit.   No results found.  Review of Systems:   A ROS was performed including pertinent positives and negatives as documented in the HPI.   Musculoskeletal Exam:    There were no vitals taken for this visit.  Left hip incisions are well-appearing without erythema or drainage.  30 degrees internal/external rotation of left hip without pain distal neurosensory exams intact with good abduction strength  Imaging:      I personally reviewed and interpreted the radiographs.   Assessment:   2 weeks status post left hip arthroscopic labral repair doing extremely well.  At this time she will continue to progress weightbearing.  She will continue to work in physical therapy and I will plan to see her back in 4 weeks for reassessment  Plan :    - Clinic 4 weeks for reassessment      I personally saw and evaluated the patient, and participated in the management and treatment plan.  Elspeth Parker, MD Attending Physician, Orthopedic Surgery  This document was dictated using Dragon voice recognition software. A reasonable attempt at proof reading has been made to minimize errors.

## 2024-05-12 NOTE — Therapy (Signed)
 OUTPATIENT PHYSICAL THERAPY  TREATMENT   Patient Name: [name] MRN: [id number] DOB:[birth year], 63 y.o., female Today's Date: 05/12/2024  END OF SESSION:  PT End of Session - 05/12/24 1117     Visit Number 3    Number of Visits 13    Date for PT Re-Evaluation 07/26/24    Authorization Type [employer]    Authorization Time Period 05/02/24-07/01/24    Authorization - Visit Number 3    Authorization - Number of Visits 12    PT Start Time 1105    PT Stop Time 1143    PT Time Calculation (min) 38 min    Activity Tolerance Patient tolerated treatment well    Behavior During Therapy [facility] for tasks assessed/performed            Past Medical History:  Diagnosis Date   Anxiety    Arthritis    rheumatoid per pt   Past Surgical History:  Procedure Laterality Date   ABDOMINAL HYSTERECTOMY  2001   TAH/BSO fibroids   CATARACT EXTRACTION Right    COLONOSCOPY  04/27/2011   GLUTEUS MINIMUS REPAIR Left 05/08/2023   Procedure: LEFT GLUTEUS MEDIUS  REPAIR  COLLAGEN PATCH;  Surgeon: Genelle Standing, MD;  Location: [facility];  Service: Orthopedics;  Laterality: Left;   obstructed bile duct  2004   Dr Ludwig   OOPHORECTOMY     BSO   REVERSE SHOULDER ARTHROPLASTY Left 02/14/2022   Procedure: LEFT REVERSE SHOULDER ARTHROPLASTY;  Surgeon: Genelle Standing, MD;  Location: [facility];  Service: Orthopedics;  Laterality: Left;   ROTATOR CUFF REPAIR Bilateral 12/2010   Left and right   Patient Active Problem List   Diagnosis Date Noted   Tear of left acetabular labrum 04/29/2024   Vitamin D  deficiency 01/22/2024   Lower extremity edema 01/22/2024   Estrogen deficiency 01/22/2024   Mild intermittent asthma without complication 01/22/2024   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   Upper respiratory tract infection 04/06/2023   Sore throat 04/06/2023   COVID-19 09/28/2022   Right calf pain 09/28/2022   Acute cough 09/28/2022   Rotator cuff arthropathy of left  shoulder    Preop examination 12/30/2021   Arthritis of carpometacarpal (CMC) joint of left thumb 12/02/2021   Lumbar radiculopathy 05/29/2017   Greater trochanteric bursitis of left hip 05/29/2017   Contusion, hip and thigh, left, initial encounter 05/29/2017   Left hip pain 04/30/2017   Abdominal pain, LLQ 04/30/2017   Back pain 02/16/2015   Obesity (BMI 30-39.9) 06/09/2013   Preventative health care 03/25/2011   OSTEOARTHRITIS 12/15/2009   IMPAIRMENT, ONE EYE MODERATE, OTHER NOS 07/01/2007   SBO 07/01/2007   HERNIA, UMBILICAL 06/11/2007    REFERRING PROVIDER: Genelle Standing, MD  REFERRING DIAG:  S73.192A (ICD-10-CM) - Tear of left acetabular labrum, initial encounter    s/p Lt hip labral repair  Rationale for Evaluation and Treatment: Rehabilitation  THERAPY DIAG:  Pain in left hip  Difficulty in walking, not elsewhere classified  Muscle weakness (generalized)  ONSET DATE: DOS 04/29/24   SUBJECTIVE:  SUBJECTIVE STATEMENT: Pt reports 4-5/10 pain level at entry. It's not too bad. Pt reports she was sore for the few days following last tx.   PERTINENT HISTORY:  N/a  PAIN:  Are you having pain? Yes: NPRS scale: 4-5/10 Pain location: Lt hip Pain description: sore Aggravating factors: bending Relieving factors: rest, ice, gentle movement  PRECAUTIONS:  None  RED FLAGS: None   WEIGHT BEARING RESTRICTIONS:  WBAT  FALLS:  Has patient fallen in last 6 months? No  LIVING ENVIRONMENT: No stairs at home  OCCUPATION:  retired  PLOF:  Independent  PATIENT GOALS:  Back to my walking and the gym   OBJECTIVE:  Note: Objective measures were completed at Evaluation unless otherwise noted.   PATIENT SURVEYS:  LEFS  Extreme difficulty/unable (0), Quite a bit of  difficulty (1), Moderate difficulty (2), Little difficulty (3), No difficulty (4) Survey date:  8/29  Any of your usual work, housework or school activities 1  2. Usual hobbies, recreational or sporting activities 0  3. Getting into/out of the bath 3  4. Walking between rooms 2  5. Putting on socks/shoes 1  6. Squatting  1  7. Lifting an object, like a bag of groceries from the floor 1  8. Performing light activities around your home 3  9. Performing heavy activities around your home 1  10. Getting into/out of a car 1  11. Walking 2 blocks 1  12. Walking 1 mile 0  13. Going up/down 10 stairs (1 flight) 1  14. Standing for 1 hour 0  15.  sitting for 1 hour 1  16. Running on even ground 0  17. Running on uneven ground 0  18. Making sharp turns while running fast 0  19. Hopping  0  20. Rolling over in bed 1  Score total:  17/80     COGNITIVE STATUS: Within functional limits for tasks assessed   SENSATION: WFL   GAIT: Comments: EVAL  SPC, slow cadence, slightly flexed at trunk   Body Part #1 Hip  PALPATION: EVAL: no s/s of infection  LOWER EXTREMITY ROM:     Passive  Right eval Left eval  Hip flexion    Hip extension  0  Hip abduction    Hip adduction    Hip internal rotation    Hip external rotation    Knee flexion    Knee extension    Ankle dorsiflexion    Ankle plantarflexion    Ankle inversion    Ankle eversion     (Blank rows = not tested)   TREATMENT DATE:   9/8 PROM L hip  STM to lateral glutes Glute squeezes 5 2x10 In hooklying Partial bridges 2x10 Hooklying abductor squeeze 5 2x10 PPT in hooklying 5 2x10 Gait in hall with SPC LAQ 3 2x10 Standing HR/TR 2x10 (Cues for posture)   9/4 PROM L hip  STM to lateral glutes Glute squeezes 5 2x10 In hooklying Hooklying abductor squeeze 5 2x10 PPT in hooklying 5 2x10 Gait in hall with SPC LAQ 3 2x10 Trialed s/l clam, but too painful   EVAL Changed bandages See HEP for  exercises Gait training- quad set at heel strike, glut set through stance phase   PATIENT EDUCATION:  Education details: Anatomy of condition, POC, HEP, exercise form/rationale Person educated: Patient Education method: Explanation, Demonstration, Tactile cues, Verbal cues, and Handouts Education comprehension: verbalized understanding, returned demonstration, verbal cues required, tactile cues required, and needs further education    HOME EXERCISE PROGRAM:  Access Code: [email or web address] URL: [email or web address] Date: 05/02/2024 Prepared by: Harlene Cordon  Exercises - Lying Prone  - 3 x daily - 7 x weekly - 1 sets - 2 min hold - Prone Gluteal Sets  - 3 x daily - 7 x weekly - 5-10 reps - Prone Knee Flexion  - 3 x daily - 7 x weekly - 5-10 reps - Supine Posterior Pelvic Tilt  - 2 x daily - 7 x weekly - 1 sets - 10 reps - Bent Knee Fallouts  - 2 x daily - 7 x weekly - 1 sets - 10 reps - Seated Hamstring Stretch  - 3 x daily - 7 x weekly - 2 sets - 5 breaths hold   ASSESSMENT:  CLINICAL IMPRESSION: Pt continues to guard with PROM, limiting available motion. She is tender throughout posterolateral hip with STM. Utilized gentle pressure here to aid with decrease tightness in piriformis and glute medius. Pt able to progress to bridges with low range today. Monitored pain level throughout session. Discussed posture with HR in standing as she reported some thoracic discomfort. Pt continue to ambulate using SPC with mild antalgic pattern. Will continue to progress as tolerated.      REHAB POTENTIAL: Good  CLINICAL DECISION MAKING: Stable/uncomplicated  EVALUATION COMPLEXITY: Low   GOALS: Goals reviewed with patient? Yes  SHORT TERM GOALS: Target date: 4 weeks 9/23  Pain free flexion to 90 Baseline: Goal status: INITIAL   2.  Demo controlled quad set with hold Baseline:  Goal status: INITIAL   3.  30s sit to stand without pain or compensation Baseline:  Goal  status: INITIAL   4.  SLS 30s without compensation Baseline:  Goal status: INITIAL    LONG TERM GOALS:  Able to demo step up on 6 step with level pelvis and proper form Baseline:  Goal status: INITIAL Week 8 10/24  2.  Will tolerate at least 3 min on elliptical, demonstrating good tolerance to repetitive weight bearing motion Baseline:  Goal status: INITIAL  Week 8 10/24  3.  Demonstrate proper form in at least 10 continuous lunges without increased pain Baseline:  Goal status: INITIAL  Week 12 08/09/24  4.  Demonstrate gentle, double and single foot plyometric motions with good proximal form Baseline:  Goal status: INITIAL Week 12 12/6  5.  Back to my walking (3-4 miles) and gym program Baseline: I can barely do 30 min at the grocery store from my hip Goal status: INITIAL      PLAN:  PT FREQUENCY: 1-2x/week  PT DURATION: POC date  PLANNED INTERVENTIONS: 97164- PT Re-evaluation, 97750- Physical Performance Testing, 97110-Therapeutic exercises, 97530- Therapeutic activity, 97112- Neuromuscular re-education, 97535- Self Care, 02859- Manual therapy, [id number]- Gait training, [phone]- Aquatic Therapy, [phone] (1-2 muscles), 20561 (3+ muscles)- Dry Needling, Patient/Family education, Balance training, Stair training, Taping, Joint mobilization, Spinal mobilization, Scar mobilization, and Cryotherapy.  PLAN FOR NEXT SESSION: gait training, glut/core strength   Asberry BRAVO Alexica Schlossberg, PTA, 05/12/2024, 2:28 PM For all possible CPT codes, reference the Planned Interventions line above.     Check all conditions that are expected to impact treatment: {Conditions expected to impact treatment:None of these apply

## 2024-05-15 ENCOUNTER — Telehealth (HOSPITAL_BASED_OUTPATIENT_CLINIC_OR_DEPARTMENT_OTHER): Payer: Self-pay | Admitting: Orthopaedic Surgery

## 2024-05-15 NOTE — Telephone Encounter (Signed)
 Patient would like a handicap plaqued and she will pick up when she has PT

## 2024-05-16 DIAGNOSIS — Z419 Encounter for procedure for purposes other than remedying health state, unspecified: Secondary | ICD-10-CM | POA: Diagnosis not present

## 2024-05-19 ENCOUNTER — Encounter (HOSPITAL_BASED_OUTPATIENT_CLINIC_OR_DEPARTMENT_OTHER): Payer: Self-pay | Admitting: Physical Therapy

## 2024-05-19 ENCOUNTER — Ambulatory Visit (HOSPITAL_BASED_OUTPATIENT_CLINIC_OR_DEPARTMENT_OTHER): Admitting: Physical Therapy

## 2024-05-19 DIAGNOSIS — R2689 Other abnormalities of gait and mobility: Secondary | ICD-10-CM | POA: Diagnosis not present

## 2024-05-19 DIAGNOSIS — M25552 Pain in left hip: Secondary | ICD-10-CM | POA: Diagnosis not present

## 2024-05-19 DIAGNOSIS — R262 Difficulty in walking, not elsewhere classified: Secondary | ICD-10-CM

## 2024-05-19 DIAGNOSIS — M6281 Muscle weakness (generalized): Secondary | ICD-10-CM | POA: Diagnosis not present

## 2024-05-19 DIAGNOSIS — M25652 Stiffness of left hip, not elsewhere classified: Secondary | ICD-10-CM | POA: Diagnosis not present

## 2024-05-19 NOTE — Therapy (Signed)
 OUTPATIENT PHYSICAL THERAPY  TREATMENT   Patient Name: [name] MRN: [id number] DOB:06-Jul-1961, 63 y.o., female Today's Date: 05/19/2024  END OF SESSION:      Past Medical History:  Diagnosis Date   Anxiety    Arthritis    rheumatoid per pt   Past Surgical History:  Procedure Laterality Date   ABDOMINAL HYSTERECTOMY  2001   TAH/BSO fibroids   CATARACT EXTRACTION Right    COLONOSCOPY  04/27/2011   GLUTEUS MINIMUS REPAIR Left 05/08/2023   Procedure: LEFT GLUTEUS MEDIUS  REPAIR  COLLAGEN PATCH;  Surgeon: Genelle Standing, MD;  Location: [facility];  Service: Orthopedics;  Laterality: Left;   obstructed bile duct  2004   Dr Ludwig   OOPHORECTOMY     BSO   REVERSE SHOULDER ARTHROPLASTY Left 02/14/2022   Procedure: LEFT REVERSE SHOULDER ARTHROPLASTY;  Surgeon: Genelle Standing, MD;  Location: [facility];  Service: Orthopedics;  Laterality: Left;   ROTATOR CUFF REPAIR Bilateral 12/2010   Left and right   Patient Active Problem List   Diagnosis Date Noted   Tear of left acetabular labrum 04/29/2024   Vitamin D  deficiency 01/22/2024   Lower extremity edema 01/22/2024   Estrogen deficiency 01/22/2024   Mild intermittent asthma without complication 01/22/2024   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   Upper respiratory tract infection 04/06/2023   Sore throat 04/06/2023   COVID-19 09/28/2022   Right calf pain 09/28/2022   Acute cough 09/28/2022   Rotator cuff arthropathy of left shoulder    Preop examination 12/30/2021   Arthritis of carpometacarpal (CMC) joint of left thumb 12/02/2021   Lumbar radiculopathy 05/29/2017   Greater trochanteric bursitis of left hip 05/29/2017   Contusion, hip and thigh, left, initial encounter 05/29/2017   Left hip pain 04/30/2017   Abdominal pain, LLQ 04/30/2017   Back pain 02/16/2015   Obesity (BMI 30-39.9) 06/09/2013   Preventative health care 03/25/2011   OSTEOARTHRITIS 12/15/2009   IMPAIRMENT, ONE EYE MODERATE,  OTHER NOS 07/01/2007   SBO 07/01/2007   HERNIA, UMBILICAL 06/11/2007    REFERRING PROVIDER: Genelle Standing, MD  REFERRING DIAG:  [phone] (ICD-10-CM) - Tear of left acetabular labrum, initial encounter    s/p Lt hip labral repair  Rationale for Evaluation and Treatment: Rehabilitation  THERAPY DIAG:  No diagnosis found.  ONSET DATE: DOS 04/29/24   SUBJECTIVE:                                                                                                                                                                                           SUBJECTIVE STATEMENT: The patient stepped off a curb and  she flet it a bit.  PERTINENT HISTORY:  N/a  PAIN:  Are you having pain? Yes: NPRS scale: 4-5/10 Pain location: Lt hip Pain description: sore Aggravating factors: bending Relieving factors: rest, ice, gentle movement  PRECAUTIONS:  None  RED FLAGS: None   WEIGHT BEARING RESTRICTIONS:  WBAT  FALLS:  Has patient fallen in last 6 months? No  LIVING ENVIRONMENT: No stairs at home  OCCUPATION:  retired  PLOF:  Independent  PATIENT GOALS:  Back to my walking and the gym   OBJECTIVE:  Note: Objective measures were completed at Evaluation unless otherwise noted.   PATIENT SURVEYS:  LEFS  Extreme difficulty/unable (0), Quite a bit of difficulty (1), Moderate difficulty (2), Little difficulty (3), No difficulty (4) Survey date:  8/29  Any of your usual work, housework or school activities 1  2. Usual hobbies, recreational or sporting activities 0  3. Getting into/out of the bath 3  4. Walking between rooms 2  5. Putting on socks/shoes 1  6. Squatting  1  7. Lifting an object, like a bag of groceries from the floor 1  8. Performing light activities around your home 3  9. Performing heavy activities around your home 1  10. Getting into/out of a car 1  11. Walking 2 blocks 1  12. Walking 1 mile 0  13. Going up/down 10 stairs (1 flight) 1  14. Standing for 1  hour 0  15.  sitting for 1 hour 1  16. Running on even ground 0  17. Running on uneven ground 0  18. Making sharp turns while running fast 0  19. Hopping  0  20. Rolling over in bed 1  Score total:  17/80     COGNITIVE STATUS: Within functional limits for tasks assessed   SENSATION: WFL   GAIT: Comments: EVAL  SPC, slow cadence, slightly flexed at trunk   Body Part #1 Hip  PALPATION: EVAL: no s/s of infection  LOWER EXTREMITY ROM:     Passive  Right eval Left eval  Hip flexion    Hip extension  0  Hip abduction    Hip adduction    Hip internal rotation    Hip external rotation    Knee flexion    Knee extension    Ankle dorsiflexion    Ankle plantarflexion    Ankle inversion    Ankle eversion     (Blank rows = not tested)   TREATMENT DATE:  9/15 Manual: roller to anterior hip    Nuero-re-ed:  Bridge 2x12  Bent knee fall out with cuing for control 3x15  Standing heel/toe x20   Thera-ex:  LAQ 3x12 Prone knee flexion 3x10 reported pain in her low back after several minutes PROM into all planes    9/8 PROM L hip  STM to lateral glutes Glute squeezes 5 2x10 In hooklying Partial bridges 2x10 Hooklying abductor squeeze 5 2x10 PPT in hooklying 5 2x10 Gait in hall with SPC LAQ 3 2x10 Standing HR/TR 2x10 (Cues for posture)   9/4 PROM L hip  STM to lateral glutes Glute squeezes 5 2x10 In hooklying Hooklying abductor squeeze 5 2x10 PPT in hooklying 5 2x10 Gait in hall with SPC LAQ 3 2x10 Trialed s/l clam, but too painful   EVAL Changed bandages See HEP for exercises Gait training- quad set at heel strike, glut set through stance phase   PATIENT EDUCATION:  Education details: Anatomy of condition, POC, HEP, exercise form/rationale Person educated: Patient Education method:  Explanation, Demonstration, Tactile cues, Verbal cues, and Handouts Education comprehension: verbalized understanding, returned demonstration, verbal cues  required, tactile cues required, and needs further education    HOME EXERCISE PROGRAM: Access Code: [email or web address] URL: [email or web address] Date: 05/02/2024 Prepared by: Harlene Cordon  Exercises - Lying Prone  - 3 x daily - 7 x weekly - 1 sets - 2 min hold - Prone Gluteal Sets  - 3 x daily - 7 x weekly - 5-10 reps - Prone Knee Flexion  - 3 x daily - 7 x weekly - 5-10 reps - Supine Posterior Pelvic Tilt  - 2 x daily - 7 x weekly - 1 sets - 10 reps - Bent Knee Fallouts  - 2 x daily - 7 x weekly - 1 sets - 10 reps - Seated Hamstring Stretch  - 3 x daily - 7 x weekly - 2 sets - 5 breaths hold   ASSESSMENT:  CLINICAL IMPRESSION: The patient was more sore today 2nd to stepping off a curb funny. We tried prone positioning but she had pain in her back. She was encouraged to continue moving in pain free ranges over the next few days and to perform self soft tissue mobilization to the anterior hip. Her hip flexion was measured at 78 degrees today. We will continue to progress as tolerated.     REHAB POTENTIAL: Good  CLINICAL DECISION MAKING: Stable/uncomplicated  EVALUATION COMPLEXITY: Low   GOALS: Goals reviewed with patient? Yes  SHORT TERM GOALS: Target date: 4 weeks 9/23  Pain free flexion to 90 Baseline: Goal status: INITIAL   2.  Demo controlled quad set with hold Baseline:  Goal status: INITIAL   3.  30s sit to stand without pain or compensation Baseline:  Goal status: INITIAL   4.  SLS 30s without compensation Baseline:  Goal status: INITIAL    LONG TERM GOALS:  Able to demo step up on 6 step with level pelvis and proper form Baseline:  Goal status: INITIAL Week 8 10/24  2.  Will tolerate at least 3 min on elliptical, demonstrating good tolerance to repetitive weight bearing motion Baseline:  Goal status: INITIAL  Week 8 10/24  3.  Demonstrate proper form in at least 10 continuous lunges without increased pain Baseline:  Goal status:  INITIAL  Week 12 08/09/24  4.  Demonstrate gentle, double and single foot plyometric motions with good proximal form Baseline:  Goal status: INITIAL Week 12 12/6  5.  Back to my walking (3-4 miles) and gym program Baseline: I can barely do 30 min at the grocery store from my hip Goal status: INITIAL      PLAN:  PT FREQUENCY: 1-2x/week  PT DURATION: POC date  PLANNED INTERVENTIONS: 97164- PT Re-evaluation, 97750- Physical Performance Testing, 97110-Therapeutic exercises, 97530- Therapeutic activity, 97112- Neuromuscular re-education, 97535- Self Care, 02859- Manual therapy, [id number]- Gait training, [phone]- Aquatic Therapy, [phone] (1-2 muscles), [phone] (3+ muscles)- Dry Needling, Patient/Family education, Balance training, Stair training, Taping, Joint mobilization, Spinal mobilization, Scar mobilization, and Cryotherapy.  PLAN FOR NEXT SESSION: gait training, glut/core strength   Alm JINNY Don, PT, 05/19/2024, 9:33 AM For all possible CPT codes, reference the Planned Interventions line above.     Check all conditions that are expected to impact treatment: {Conditions expected to impact treatment:None of these apply

## 2024-05-20 DIAGNOSIS — H1031 Unspecified acute conjunctivitis, right eye: Secondary | ICD-10-CM | POA: Diagnosis not present

## 2024-05-24 NOTE — Therapy (Unsigned)
 OUTPATIENT PHYSICAL THERAPY  TREATMENT   Patient Name: [name] DOB:[birth year], 63 y.o., female Today's Date: 05/28/2024  END OF SESSION:  PT End of Session - 05/28/24 0925     Visit Number 6    Number of Visits 13    Date for Recertification  07/26/24    Authorization Type [employer]    Authorization Time Period 05/02/24-07/01/24    Authorization - Visit Number 6    Authorization - Number of Visits 12    PT Start Time 0927    PT Stop Time 1008    PT Time Calculation (min) 41 min    Activity Tolerance Patient tolerated treatment well    Behavior During Therapy [facility] for tasks assessed/performed             Past Medical History:  Diagnosis Date   Anxiety    Arthritis    rheumatoid per pt   Past Surgical History:  Procedure Laterality Date   ABDOMINAL HYSTERECTOMY  2001   TAH/BSO fibroids   CATARACT EXTRACTION Right    COLONOSCOPY  04/27/2011   GLUTEUS MINIMUS REPAIR Left 05/08/2023   Procedure: LEFT GLUTEUS MEDIUS  REPAIR  COLLAGEN PATCH;  Surgeon: Genelle Standing, MD;  Location: [facility];  Service: Orthopedics;  Laterality: Left;   obstructed bile duct  2004   Dr Ludwig   OOPHORECTOMY     BSO   REVERSE SHOULDER ARTHROPLASTY Left 02/14/2022   Procedure: LEFT REVERSE SHOULDER ARTHROPLASTY;  Surgeon: Genelle Standing, MD;  Location: [facility];  Service: Orthopedics;  Laterality: Left;   ROTATOR CUFF REPAIR Bilateral 12/2010   Left and right   Patient Active Problem List   Diagnosis Date Noted   Tear of left acetabular labrum 04/29/2024   Vitamin D  deficiency 01/22/2024   Lower extremity edema 01/22/2024   Estrogen deficiency 01/22/2024   Mild intermittent asthma without complication 01/22/2024   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   Upper respiratory tract infection 04/06/2023   Sore throat 04/06/2023   COVID-19 09/28/2022   Right calf pain 09/28/2022   Acute cough 09/28/2022   Rotator cuff arthropathy of left  shoulder    Preop examination 12/30/2021   Arthritis of carpometacarpal [facility]) joint of left thumb 12/02/2021   Lumbar radiculopathy 05/29/2017   Greater trochanteric bursitis of left hip 05/29/2017   Contusion, hip and thigh, left, initial encounter 05/29/2017   Left hip pain 04/30/2017   Abdominal pain, LLQ 04/30/2017   Back pain 02/16/2015   Obesity (BMI 30-39.9) 06/09/2013   Preventative health care 03/25/2011   OSTEOARTHRITIS 12/15/2009   IMPAIRMENT, ONE EYE MODERATE, OTHER NOS 07/01/2007   SBO 07/01/2007   HERNIA, UMBILICAL 06/11/2007    REFERRING PROVIDER: Genelle Standing, MD  REFERRING DIAG:  [phone] (ICD-10-CM) - Tear of left acetabular labrum, initial encounter    s/p Lt hip labral repair  Rationale for Evaluation and Treatment: Rehabilitation  THERAPY DIAG:  Pain in left hip  Stiffness of left hip, not elsewhere classified  Other abnormalities of gait and mobility  Difficulty in walking, not elsewhere classified  Muscle weakness (generalized)  ONSET DATE: DOS 04/29/24   SUBJECTIVE:  SUBJECTIVE STATEMENT: I am feeling better today, but continue to have some pain in my lower back when walking.   PERTINENT HISTORY:  N/a  PAIN:  Are you having pain? Yes: NPRS scale: 4-5/10 Pain location: Lt hip Pain description: sore Aggravating factors: bending Relieving factors: rest, ice, gentle movement  PRECAUTIONS:  None  RED FLAGS: None   WEIGHT BEARING RESTRICTIONS:  WBAT  FALLS:  Has patient fallen in last 6 months? No  LIVING ENVIRONMENT: No stairs at home  OCCUPATION:  retired  PLOF:  Independent  PATIENT GOALS:  Back to my walking and the gym   OBJECTIVE:  Note: Objective measures were completed at Evaluation unless otherwise noted.   PATIENT SURVEYS:   LEFS  Extreme difficulty/unable (0), Quite a bit of difficulty (1), Moderate difficulty (2), Little difficulty (3), No difficulty (4) Survey date:  8/29  Any of your usual work, housework or school activities 1  2. Usual hobbies, recreational or sporting activities 0  3. Getting into/out of the bath 3  4. Walking between rooms 2  5. Putting on socks/shoes 1  6. Squatting  1  7. Lifting an object, like a bag of groceries from the floor 1  8. Performing light activities around your home 3  9. Performing heavy activities around your home 1  10. Getting into/out of a car 1  11. Walking 2 blocks 1  12. Walking 1 mile 0  13. Going up/down 10 stairs (1 flight) 1  14. Standing for 1 hour 0  15.  sitting for 1 hour 1  16. Running on even ground 0  17. Running on uneven ground 0  18. Making sharp turns while running fast 0  19. Hopping  0  20. Rolling over in bed 1  Score total:  17/80     COGNITIVE STATUS: Within functional limits for tasks assessed   SENSATION: WFL   GAIT: Comments: EVAL  SPC, slow cadence, slightly flexed at trunk   Body Part #1 Hip  PALPATION: EVAL: no s/s of infection  LOWER EXTREMITY ROM:     Passive  Right eval Left eval  Hip flexion    Hip extension  0  Hip abduction    Hip adduction    Hip internal rotation    Hip external rotation    Knee flexion    Knee extension    Ankle dorsiflexion    Ankle plantarflexion    Ankle inversion    Ankle eversion     (Blank rows = not tested)   TREATMENT DATE:  9/24 Manual: TpR to anterior hip and piriformis.     Nuero-re-ed:  Bridge 2x12 with GTB for increased glute activation  Clams with GTB with cues for proper form 1x15  Standing heel/toe x20   Thera-ex:  Nustep lvl 5, 5 min.  Standing hip abduction to tolerance, bilat  LAQ 3x12 Physioball LTR to tolerance.  Self massage with tennis ball   There act:  Walking down hall way 79ft x 2 Stairs with step through gait  pattern    9/8 PROM L hip  STM to lateral glutes Glute squeezes 5 2x10 In hooklying Partial bridges 2x10 Hooklying abductor squeeze 5 2x10 PPT in hooklying 5 2x10 Gait in hall with SPC LAQ 3 2x10 Standing HR/TR 2x10 (Cues for posture)   9/4 PROM L hip  STM to lateral glutes Glute squeezes 5 2x10 In hooklying Hooklying abductor squeeze 5 2x10 PPT in hooklying 5 2x10 Gait in hall with [facility]  LAQ 3 2x10 Trialed s/l clam, but too painful   EVAL Changed bandages See HEP for exercises Gait training- quad set at heel strike, glut set through stance phase   PATIENT EDUCATION:  Education details: Anatomy of condition, POC, HEP, exercise form/rationale Person educated: Patient Education method: Explanation, Demonstration, Tactile cues, Verbal cues, and Handouts Education comprehension: verbalized understanding, returned demonstration, verbal cues required, tactile cues required, and needs further education    HOME EXERCISE PROGRAM: Access Code: [email or web address] URL: [email or web address] Date: 05/02/2024 Prepared by: Harlene Cordon  Exercises - Lying Prone  - 3 x daily - 7 x weekly - 1 sets - 2 min hold - Prone Gluteal Sets  - 3 x daily - 7 x weekly - 5-10 reps - Prone Knee Flexion  - 3 x daily - 7 x weekly - 5-10 reps - Supine Posterior Pelvic Tilt  - 2 x daily - 7 x weekly - 1 sets - 10 reps - Bent Knee Fallouts  - 2 x daily - 7 x weekly - 1 sets - 10 reps - Seated Hamstring Stretch  - 3 x daily - 7 x weekly - 2 sets - 5 breaths hold   ASSESSMENT:  CLINICAL IMPRESSION: Pt continues to have tension in her anterior hip and L piriformis. Encouraged  self massage with tennis ball and modified thomas stretch intermittently throughout the day.  She ambulates with an antalgic gait and has significant weakness in hip's. She is tolerating exercises with intermittent pain noted on L side with increased WB. We will continue to progress as tolerated.      REHAB POTENTIAL: Good  CLINICAL DECISION MAKING: Stable/uncomplicated  EVALUATION COMPLEXITY: Low   GOALS: Goals reviewed with patient? Yes  SHORT TERM GOALS: Target date: 4 weeks 9/23  Pain free flexion to 90 Baseline: Goal status: INITIAL   2.  Demo controlled quad set with hold Baseline:  Goal status: INITIAL   3.  30s sit to stand without pain or compensation Baseline:  Goal status: INITIAL   4.  SLS 30s without compensation Baseline:  Goal status: INITIAL    LONG TERM GOALS:  Able to demo step up on 6 step with level pelvis and proper form Baseline:  Goal status: INITIAL Week 8 10/24  2.  Will tolerate at least 3 min on elliptical, demonstrating good tolerance to repetitive weight bearing motion Baseline:  Goal status: INITIAL  Week 8 10/24  3.  Demonstrate proper form in at least 10 continuous lunges without increased pain Baseline:  Goal status: INITIAL  Week 12 08/09/24  4.  Demonstrate gentle, double and single foot plyometric motions with good proximal form Baseline:  Goal status: INITIAL Week 12 12/6  5.  Back to my walking (3-4 miles) and gym program Baseline: I can barely do 30 min at the grocery store from my hip Goal status: INITIAL      PLAN:  PT FREQUENCY: 1-2x/week  PT DURATION: POC date  PLANNED INTERVENTIONS: 97164- PT Re-evaluation, 97750- Physical Performance Testing, 97110-Therapeutic exercises, 97530- Therapeutic activity, 97112- Neuromuscular re-education, 97535- Self Care, 02859- Manual therapy, [id number]- Gait training, [phone]- Aquatic Therapy, [phone] (1-2 muscles), 20561 (3+ muscles)- Dry Needling, Patient/Family education, Balance training, Stair training, Taping, Joint mobilization, Spinal mobilization, Scar mobilization, and Cryotherapy.  PLAN FOR NEXT SESSION: gait training, glut/core strength   Rojean JONELLE Batten, PT, 05/28/2024, 10:12 AM For all possible CPT codes, reference the Planned Interventions line above.      Check all conditions that are expected to  impact treatment: {Conditions expected to impact treatment:None of these apply

## 2024-05-26 ENCOUNTER — Encounter (HOSPITAL_BASED_OUTPATIENT_CLINIC_OR_DEPARTMENT_OTHER): Payer: Self-pay | Admitting: Physical Therapy

## 2024-05-26 ENCOUNTER — Ambulatory Visit (HOSPITAL_BASED_OUTPATIENT_CLINIC_OR_DEPARTMENT_OTHER): Admitting: Physical Therapy

## 2024-05-26 DIAGNOSIS — R262 Difficulty in walking, not elsewhere classified: Secondary | ICD-10-CM

## 2024-05-26 DIAGNOSIS — R2689 Other abnormalities of gait and mobility: Secondary | ICD-10-CM | POA: Diagnosis not present

## 2024-05-26 DIAGNOSIS — M6281 Muscle weakness (generalized): Secondary | ICD-10-CM | POA: Diagnosis not present

## 2024-05-26 DIAGNOSIS — M25552 Pain in left hip: Secondary | ICD-10-CM | POA: Diagnosis not present

## 2024-05-26 DIAGNOSIS — M25652 Stiffness of left hip, not elsewhere classified: Secondary | ICD-10-CM

## 2024-05-26 NOTE — Therapy (Cosign Needed)
 OUTPATIENT PHYSICAL THERAPY  TREATMENT   Patient Name: [name] MRN: [id number] DOB:Aug 12, 1961, 63 y.o., female Today's Date: 05/26/2024  END OF SESSION:  PT End of Session - 05/26/24 0935     Visit Number 5    Number of Visits 13    Date for Recertification  07/26/24    Authorization Type [employer]    Authorization Time Period 05/02/24-07/01/24    Authorization - Number of Visits 12    PT Start Time 0935    PT Stop Time 1015    PT Time Calculation (min) 40 min    Activity Tolerance Patient tolerated treatment well    Behavior During Therapy [facility] for tasks assessed/performed             Past Medical History:  Diagnosis Date   Anxiety    Arthritis    rheumatoid per pt   Past Surgical History:  Procedure Laterality Date   ABDOMINAL HYSTERECTOMY  2001   TAH/BSO fibroids   CATARACT EXTRACTION Right    COLONOSCOPY  04/27/2011   GLUTEUS MINIMUS REPAIR Left 05/08/2023   Procedure: LEFT GLUTEUS MEDIUS  REPAIR  COLLAGEN PATCH;  Surgeon: Genelle Standing, MD;  Location: [facility];  Service: Orthopedics;  Laterality: Left;   obstructed bile duct  2004   Dr Ludwig   OOPHORECTOMY     BSO   REVERSE SHOULDER ARTHROPLASTY Left 02/14/2022   Procedure: LEFT REVERSE SHOULDER ARTHROPLASTY;  Surgeon: Genelle Standing, MD;  Location: [facility];  Service: Orthopedics;  Laterality: Left;   ROTATOR CUFF REPAIR Bilateral 12/2010   Left and right   Patient Active Problem List   Diagnosis Date Noted   Tear of left acetabular labrum 04/29/2024   Vitamin D  deficiency 01/22/2024   Lower extremity edema 01/22/2024   Estrogen deficiency 01/22/2024   Mild intermittent asthma without complication 01/22/2024   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   Upper respiratory tract infection 04/06/2023   Sore throat 04/06/2023   COVID-19 09/28/2022   Right calf pain 09/28/2022   Acute cough 09/28/2022   Rotator cuff arthropathy of left shoulder    Preop examination  12/30/2021   Arthritis of carpometacarpal [facility]) joint of left thumb 12/02/2021   Lumbar radiculopathy 05/29/2017   Greater trochanteric bursitis of left hip 05/29/2017   Contusion, hip and thigh, left, initial encounter 05/29/2017   Left hip pain 04/30/2017   Abdominal pain, LLQ 04/30/2017   Back pain 02/16/2015   Obesity (BMI 30-39.9) 06/09/2013   Preventative health care 03/25/2011   OSTEOARTHRITIS 12/15/2009   IMPAIRMENT, ONE EYE MODERATE, OTHER NOS 07/01/2007   SBO 07/01/2007   HERNIA, UMBILICAL 06/11/2007    REFERRING PROVIDER: Genelle Standing, MD  REFERRING DIAG:  [phone] (ICD-10-CM) - Tear of left acetabular labrum, initial encounter    s/p Lt hip labral repair  Rationale for Evaluation and Treatment: Rehabilitation  THERAPY DIAG:  Pain in left hip  Stiffness of left hip, not elsewhere classified  Other abnormalities of gait and mobility  Difficulty in walking, not elsewhere classified  Muscle weakness (generalized)  ONSET DATE: DOS 04/29/24   SUBJECTIVE:  SUBJECTIVE STATEMENT: Patient reports she is doing well today. Sharp pain around front of hip.  PERTINENT HISTORY:  N/a  PAIN:  Are you having pain? Yes: NPRS scale: 4-5/10 Pain location: Lt hip Pain description: sore Aggravating factors: bending Relieving factors: rest, ice, gentle movement  PRECAUTIONS:  None  RED FLAGS: None   WEIGHT BEARING RESTRICTIONS:  WBAT  FALLS:  Has patient fallen in last 6 months? No  LIVING ENVIRONMENT: No stairs at home  OCCUPATION:  retired  PLOF:  Independent  PATIENT GOALS:  Back to my walking and the gym   OBJECTIVE:  Note: Objective measures were completed at Evaluation unless otherwise noted.   PATIENT SURVEYS:  LEFS  Extreme difficulty/unable (0), Quite a  bit of difficulty (1), Moderate difficulty (2), Little difficulty (3), No difficulty (4) Survey date:  8/29  Any of your usual work, housework or school activities 1  2. Usual hobbies, recreational or sporting activities 0  3. Getting into/out of the bath 3  4. Walking between rooms 2  5. Putting on socks/shoes 1  6. Squatting  1  7. Lifting an object, like a bag of groceries from the floor 1  8. Performing light activities around your home 3  9. Performing heavy activities around your home 1  10. Getting into/out of a car 1  11. Walking 2 blocks 1  12. Walking 1 mile 0  13. Going up/down 10 stairs (1 flight) 1  14. Standing for 1 hour 0  15.  sitting for 1 hour 1  16. Running on even ground 0  17. Running on uneven ground 0  18. Making sharp turns while running fast 0  19. Hopping  0  20. Rolling over in bed 1  Score total:  17/80     COGNITIVE STATUS: Within functional limits for tasks assessed   SENSATION: WFL   GAIT: Comments: EVAL  SPC, slow cadence, slightly flexed at trunk   Body Part #1 Hip  PALPATION: EVAL: no s/s of infection  LOWER EXTREMITY ROM:     Passive  Right eval Left eval  Hip flexion    Hip extension  0  Hip abduction    Hip adduction    Hip internal rotation    Hip external rotation    Knee flexion    Knee extension    Ankle dorsiflexion    Ankle plantarflexion    Ankle inversion    Ankle eversion     (Blank rows = not tested)   TREATMENT DATE:  9/22 Manual: STM and trigger point release to left anterior hip and IT band, Grade I inferior glide right L hip with LAD, roller to anterior hip SciFit x5 min  Supine pelvic tilts with knees bent 3x12  Glute bridges 3x12 Bent knee fallout 2x12  Seated LAQ x12, progressed to 1.5# 2x12  9/15 Manual: roller to anterior hip  Nuero-re-ed:  Bridge 2x12  Bent knee fall out with cuing for control 3x15  Standing heel/toe x20   Thera-ex:  LAQ 3x12 Prone knee flexion 3x10 reported pain in  her low back after several minutes PROM into all planes    9/8 PROM L hip  STM to lateral glutes Glute squeezes 5 2x10 In hooklying Partial bridges 2x10 Hooklying abductor squeeze 5 2x10 PPT in hooklying 5 2x10 Gait in hall with SPC LAQ 3 2x10 Standing HR/TR 2x10 (Cues for posture)   9/4 PROM L hip  STM to lateral glutes Glute squeezes 5 2x10 In hooklying  Hooklying abductor squeeze 5 2x10 PPT in hooklying 5 2x10 Gait in hall with SPC LAQ 3 2x10 Trialed s/l clam, but too painful   EVAL Changed bandages See HEP for exercises Gait training- quad set at heel strike, glut set through stance phase   PATIENT EDUCATION:  Education details: Anatomy of condition, POC, HEP, exercise form/rationale Person educated: Patient Education method: Explanation, Demonstration, Tactile cues, Verbal cues, and Handouts Education comprehension: verbalized understanding, returned demonstration, verbal cues required, tactile cues required, and needs further education    HOME EXERCISE PROGRAM: Access Code: [email or web address] URL: [email or web address] Date: 05/02/2024 Prepared by: Harlene Cordon  Exercises - Lying Prone  - 3 x daily - 7 x weekly - 1 sets - 2 min hold - Prone Gluteal Sets  - 3 x daily - 7 x weekly - 5-10 reps - Prone Knee Flexion  - 3 x daily - 7 x weekly - 5-10 reps - Supine Posterior Pelvic Tilt  - 2 x daily - 7 x weekly - 1 sets - 10 reps - Bent Knee Fallouts  - 2 x daily - 7 x weekly - 1 sets - 10 reps - Seated Hamstring Stretch  - 3 x daily - 7 x weekly - 2 sets - 5 breaths hold   ASSESSMENT:  CLINICAL IMPRESSION: Patient tolerated therapy well today but demonstrated increased tenderness to the anterior hip. Therapy today focused on manual interventions to decrease pain and inflammation. Patient encouraged to modify the amount of exercise she is doing for more rest and encouraged to use ice at home. Continue to progress as tolerated depending on  pain. She reports she has been doing a large amount of hip flexion at home int he the bed as exercises. She was advised to stick to given HEP and stop doing that for the time being.    REHAB POTENTIAL: Good  CLINICAL DECISION MAKING: Stable/uncomplicated  EVALUATION COMPLEXITY: Low   GOALS: Goals reviewed with patient? Yes  SHORT TERM GOALS: Target date: 4 weeks 9/23  Pain free flexion to 90 Baseline: Goal status: painful at 90 today 9/23  2.  Demo controlled quad set with hold Baseline:  Goal status: INITIAL   3.  30s sit to stand without pain or compensation Baseline:  Goal status: INITIAL   4.  SLS 30s without compensation Baseline:  Goal status: INITIAL    LONG TERM GOALS:  Able to demo step up on 6 step with level pelvis and proper form Baseline:  Goal status: INITIAL Week 8 10/24  2.  Will tolerate at least 3 min on elliptical, demonstrating good tolerance to repetitive weight bearing motion Baseline:  Goal status: INITIAL  Week 8 10/24  3.  Demonstrate proper form in at least 10 continuous lunges without increased pain Baseline:  Goal status: INITIAL  Week 12 08/09/24  4.  Demonstrate gentle, double and single foot plyometric motions with good proximal form Baseline:  Goal status: INITIAL Week 12 12/6  5.  Back to my walking (3-4 miles) and gym program Baseline: I can barely do 30 min at the grocery store from my hip Goal status: INITIAL      PLAN:  PT FREQUENCY: 1-2x/week  PT DURATION: POC date  PLANNED INTERVENTIONS: 97164- PT Re-evaluation, 97750- Physical Performance Testing, 97110-Therapeutic exercises, 97530- Therapeutic activity, 97112- Neuromuscular re-education, 97535- Self Care, 02859- Manual therapy, [id number]- Gait training, [phone]- Aquatic Therapy, [phone] (1-2 muscles), [id number] (3+ muscles)- Dry Needling, Patient/Family education, Balance training, Stair training, Taping, Joint mobilization, Spinal mobilization,  Scar mobilization, and  Cryotherapy.  PLAN FOR NEXT SESSION: gait training, glut/core strength   Lili Finder, Student-PT, 05/26/2024, 9:36 AM For all possible CPT codes, reference the Planned Interventions line above.     Check all conditions that are expected to impact treatment: {Conditions expected to impact treatment:None of these apply   I have reviewed and concur with this student's documentation.   Alm JINNY Don, PT 05/27/2024 8:09 AM   During this treatment session, the therapist was present, participating in and directing the treatment.

## 2024-05-27 ENCOUNTER — Encounter (HOSPITAL_BASED_OUTPATIENT_CLINIC_OR_DEPARTMENT_OTHER): Payer: Self-pay | Admitting: Physical Therapy

## 2024-05-28 ENCOUNTER — Ambulatory Visit (HOSPITAL_BASED_OUTPATIENT_CLINIC_OR_DEPARTMENT_OTHER): Admitting: Physical Therapy

## 2024-05-28 ENCOUNTER — Encounter (HOSPITAL_BASED_OUTPATIENT_CLINIC_OR_DEPARTMENT_OTHER): Payer: Self-pay | Admitting: Physical Therapy

## 2024-05-28 DIAGNOSIS — R2689 Other abnormalities of gait and mobility: Secondary | ICD-10-CM | POA: Diagnosis not present

## 2024-05-28 DIAGNOSIS — M25552 Pain in left hip: Secondary | ICD-10-CM

## 2024-05-28 DIAGNOSIS — M6281 Muscle weakness (generalized): Secondary | ICD-10-CM | POA: Diagnosis not present

## 2024-05-28 DIAGNOSIS — M25652 Stiffness of left hip, not elsewhere classified: Secondary | ICD-10-CM | POA: Diagnosis not present

## 2024-05-28 DIAGNOSIS — R262 Difficulty in walking, not elsewhere classified: Secondary | ICD-10-CM | POA: Diagnosis not present

## 2024-06-02 ENCOUNTER — Ambulatory Visit (HOSPITAL_BASED_OUTPATIENT_CLINIC_OR_DEPARTMENT_OTHER): Admitting: Physical Therapy

## 2024-06-02 NOTE — Therapy (Deleted)
 OUTPATIENT PHYSICAL THERAPY  TREATMENT   Patient Name: [name] MRN: [id number] DOB:09-27-1960, 63 y.o., female Today's Date: 06/02/2024  END OF SESSION:       Past Medical History:  Diagnosis Date   Anxiety    Arthritis    rheumatoid per pt   Past Surgical History:  Procedure Laterality Date   ABDOMINAL HYSTERECTOMY  2001   TAH/BSO fibroids   CATARACT EXTRACTION Right    COLONOSCOPY  04/27/2011   GLUTEUS MINIMUS REPAIR Left 05/08/2023   Procedure: LEFT GLUTEUS MEDIUS  REPAIR  COLLAGEN PATCH;  Surgeon: Genelle Standing, MD;  Location: [facility];  Service: Orthopedics;  Laterality: Left;   obstructed bile duct  2004   Dr Ludwig   OOPHORECTOMY     BSO   REVERSE SHOULDER ARTHROPLASTY Left 02/14/2022   Procedure: LEFT REVERSE SHOULDER ARTHROPLASTY;  Surgeon: Genelle Standing, MD;  Location: [facility];  Service: Orthopedics;  Laterality: Left;   ROTATOR CUFF REPAIR Bilateral 12/2010   Left and right   Patient Active Problem List   Diagnosis Date Noted   Tear of left acetabular labrum 04/29/2024   Vitamin D  deficiency 01/22/2024   Lower extremity edema 01/22/2024   Estrogen deficiency 01/22/2024   Mild intermittent asthma without complication 01/22/2024   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   Upper respiratory tract infection 04/06/2023   Sore throat 04/06/2023   COVID-19 09/28/2022   Right calf pain 09/28/2022   Acute cough 09/28/2022   Rotator cuff arthropathy of left shoulder    Preop examination 12/30/2021   Arthritis of carpometacarpal (CMC) joint of left thumb 12/02/2021   Lumbar radiculopathy 05/29/2017   Greater trochanteric bursitis of left hip 05/29/2017   Contusion, hip and thigh, left, initial encounter 05/29/2017   Left hip pain 04/30/2017   Abdominal pain, LLQ 04/30/2017   Back pain 02/16/2015   Obesity (BMI 30-39.9) 06/09/2013   Preventative health care 03/25/2011   OSTEOARTHRITIS 12/15/2009   IMPAIRMENT, ONE EYE MODERATE,  OTHER NOS 07/01/2007   SBO 07/01/2007   HERNIA, UMBILICAL 06/11/2007    REFERRING PROVIDER: Genelle Standing, MD  REFERRING DIAG:  [phone] (ICD-10-CM) - Tear of left acetabular labrum, initial encounter    s/p Lt hip labral repair  Rationale for Evaluation and Treatment: Rehabilitation  THERAPY DIAG:  No diagnosis found.  ONSET DATE: DOS 04/29/24   SUBJECTIVE:                                                                                                                                                                                           SUBJECTIVE STATEMENT: I am feeling better today, but  continue to have some pain in my lower back when walking.   PERTINENT HISTORY:  N/a  PAIN:  Are you having pain? Yes: NPRS scale: 4-5/10 Pain location: Lt hip Pain description: sore Aggravating factors: bending Relieving factors: rest, ice, gentle movement  PRECAUTIONS:  None  RED FLAGS: None   WEIGHT BEARING RESTRICTIONS:  WBAT  FALLS:  Has patient fallen in last 6 months? No  LIVING ENVIRONMENT: No stairs at home  OCCUPATION:  retired  PLOF:  Independent  PATIENT GOALS:  Back to my walking and the gym   OBJECTIVE:  Note: Objective measures were completed at Evaluation unless otherwise noted.   PATIENT SURVEYS:  LEFS  Extreme difficulty/unable (0), Quite a bit of difficulty (1), Moderate difficulty (2), Little difficulty (3), No difficulty (4) Survey date:  8/29  Any of your usual work, housework or school activities 1  2. Usual hobbies, recreational or sporting activities 0  3. Getting into/out of the bath 3  4. Walking between rooms 2  5. Putting on socks/shoes 1  6. Squatting  1  7. Lifting an object, like a bag of groceries from the floor 1  8. Performing light activities around your home 3  9. Performing heavy activities around your home 1  10. Getting into/out of a car 1  11. Walking 2 blocks 1  12. Walking 1 mile 0  13. Going up/down 10  stairs (1 flight) 1  14. Standing for 1 hour 0  15.  sitting for 1 hour 1  16. Running on even ground 0  17. Running on uneven ground 0  18. Making sharp turns while running fast 0  19. Hopping  0  20. Rolling over in bed 1  Score total:  17/80     COGNITIVE STATUS: Within functional limits for tasks assessed   SENSATION: WFL   GAIT: Comments: EVAL  SPC, slow cadence, slightly flexed at trunk   Body Part #1 Hip  PALPATION: EVAL: no s/s of infection  LOWER EXTREMITY ROM:     Passive  Right eval Left eval  Hip flexion    Hip extension  0  Hip abduction    Hip adduction    Hip internal rotation    Hip external rotation    Knee flexion    Knee extension    Ankle dorsiflexion    Ankle plantarflexion    Ankle inversion    Ankle eversion     (Blank rows = not tested)   TREATMENT DATE:  9/24 Manual: TpR to anterior hip and piriformis.     Nuero-re-ed:  Bridge 2x12 with GTB for increased glute activation  Clams with GTB with cues for proper form 1x15  Standing heel/toe x20   Thera-ex:  Nustep lvl 5, 5 min.  Standing hip abduction to tolerance, bilat  LAQ 3x12 Physioball LTR to tolerance.  Self massage with tennis ball   There act:  Walking down hall way 33ft x 2 Stairs with step through gait pattern    9/8 PROM L hip  STM to lateral glutes Glute squeezes 5 2x10 In hooklying Partial bridges 2x10 Hooklying abductor squeeze 5 2x10 PPT in hooklying 5 2x10 Gait in hall with SPC LAQ 3 2x10 Standing HR/TR 2x10 (Cues for posture)   9/4 PROM L hip  STM to lateral glutes Glute squeezes 5 2x10 In hooklying Hooklying abductor squeeze 5 2x10 PPT in hooklying 5 2x10 Gait in hall with SPC LAQ 3 2x10 Trialed s/l clam, but too painful  EVAL Changed bandages See HEP for exercises Gait training- quad set at heel strike, glut set through stance phase   PATIENT EDUCATION:  Education details: Anatomy of condition, POC, HEP, exercise  form/rationale Person educated: Patient Education method: Explanation, Demonstration, Tactile cues, Verbal cues, and Handouts Education comprehension: verbalized understanding, returned demonstration, verbal cues required, tactile cues required, and needs further education    HOME EXERCISE PROGRAM: Access Code: [email or web address] URL: [email or web address] Date: 05/02/2024 Prepared by: Harlene Cordon  Exercises - Lying Prone  - 3 x daily - 7 x weekly - 1 sets - 2 min hold - Prone Gluteal Sets  - 3 x daily - 7 x weekly - 5-10 reps - Prone Knee Flexion  - 3 x daily - 7 x weekly - 5-10 reps - Supine Posterior Pelvic Tilt  - 2 x daily - 7 x weekly - 1 sets - 10 reps - Bent Knee Fallouts  - 2 x daily - 7 x weekly - 1 sets - 10 reps - Seated Hamstring Stretch  - 3 x daily - 7 x weekly - 2 sets - 5 breaths hold   ASSESSMENT:  CLINICAL IMPRESSION: Pt continues to have tension in her anterior hip and L piriformis. Encouraged  self massage with tennis ball and modified thomas stretch intermittently throughout the day.  She ambulates with an antalgic gait and has significant weakness in hip's. She is tolerating exercises with intermittent pain noted on L side with increased WB. We will continue to progress as tolerated.     REHAB POTENTIAL: Good  CLINICAL DECISION MAKING: Stable/uncomplicated  EVALUATION COMPLEXITY: Low   GOALS: Goals reviewed with patient? Yes  SHORT TERM GOALS: Target date: 4 weeks 9/23  Pain free flexion to 90 Baseline: Goal status: INITIAL   2.  Demo controlled quad set with hold Baseline:  Goal status: INITIAL   3.  30s sit to stand without pain or compensation Baseline:  Goal status: INITIAL   4.  SLS 30s without compensation Baseline:  Goal status: INITIAL    LONG TERM GOALS:  Able to demo step up on 6 step with level pelvis and proper form Baseline:  Goal status: INITIAL Week 8 10/24  2.  Will tolerate at least 3 min on elliptical,  demonstrating good tolerance to repetitive weight bearing motion Baseline:  Goal status: INITIAL  Week 8 10/24  3.  Demonstrate proper form in at least 10 continuous lunges without increased pain Baseline:  Goal status: INITIAL  Week 12 08/09/24  4.  Demonstrate gentle, double and single foot plyometric motions with good proximal form Baseline:  Goal status: INITIAL Week 12 12/6  5.  Back to my walking (3-4 miles) and gym program Baseline: I can barely do 30 min at the grocery store from my hip Goal status: INITIAL      PLAN:  PT FREQUENCY: 1-2x/week  PT DURATION: POC date  PLANNED INTERVENTIONS: 97164- PT Re-evaluation, 97750- Physical Performance Testing, 97110-Therapeutic exercises, 97530- Therapeutic activity, 97112- Neuromuscular re-education, 97535- Self Care, 02859- Manual therapy, [id number]- Gait training, [phone]- Aquatic Therapy, [phone] (1-2 muscles), 20561 (3+ muscles)- Dry Needling, Patient/Family education, Balance training, Stair training, Taping, Joint mobilization, Spinal mobilization, Scar mobilization, and Cryotherapy.  PLAN FOR NEXT SESSION: gait training, glut/core strength   Rojean JONELLE Batten, PT, 06/02/2024, 11:08 AM For all possible CPT codes, reference the Planned Interventions line above.     Check all conditions that are expected to impact treatment: {Conditions expected to impact treatment:None of these apply

## 2024-06-04 ENCOUNTER — Ambulatory Visit (HOSPITAL_BASED_OUTPATIENT_CLINIC_OR_DEPARTMENT_OTHER): Admitting: Physical Therapy

## 2024-06-13 ENCOUNTER — Ambulatory Visit (HOSPITAL_BASED_OUTPATIENT_CLINIC_OR_DEPARTMENT_OTHER): Admitting: Orthopaedic Surgery

## 2024-06-13 ENCOUNTER — Other Ambulatory Visit (HOSPITAL_BASED_OUTPATIENT_CLINIC_OR_DEPARTMENT_OTHER): Payer: Self-pay

## 2024-06-13 DIAGNOSIS — S73192A Other sprain of left hip, initial encounter: Secondary | ICD-10-CM

## 2024-06-13 NOTE — Progress Notes (Signed)
 Post Operative Evaluation    Procedure/Date of Surgery: Left hip arthroscopy with labral repair 8/26  Interval History:   Presents 6 weeks status post left hip arthroscopy with labral repair.  Overall she is doing extremely well.  At this time she is to continue to progress with physical therapy in terms of strengthening.  She is quite happy with her outcome she did recently have a sickness which unfortunately left her limited from a mobility perspective   PMH/PSH/Family History/Social History/Meds/Allergies:    Past Medical History:  Diagnosis Date   Anxiety    Arthritis    rheumatoid per pt   Past Surgical History:  Procedure Laterality Date   ABDOMINAL HYSTERECTOMY  2001   TAH/BSO fibroids   CATARACT EXTRACTION Right    COLONOSCOPY  04/27/2011   GLUTEUS MINIMUS REPAIR Left 05/08/2023   Procedure: LEFT GLUTEUS MEDIUS  REPAIR  COLLAGEN PATCH;  Surgeon: Genelle Standing, MD;  Location: [facility];  Service: Orthopedics;  Laterality: Left;   obstructed bile duct  2004   Dr Ludwig   OOPHORECTOMY     BSO   REVERSE SHOULDER ARTHROPLASTY Left 02/14/2022   Procedure: LEFT REVERSE SHOULDER ARTHROPLASTY;  Surgeon: Genelle Standing, MD;  Location: [facility];  Service: Orthopedics;  Laterality: Left;   ROTATOR CUFF REPAIR Bilateral 12/2010   Left and right   Social History   Socioeconomic History   Marital status: Single    Spouse name: Not on file   Number of children: 1   Years of education: Not on file   Highest education level: Not on file  Occupational History   Occupation: MEAT CUTTER    Employer: [employer]  Tobacco Use   Smoking status: Never   Smokeless tobacco: Never  Vaping Use   Vaping status: Never Used  Substance and Sexual Activity   Alcohol use: Yes    Alcohol/week: 0.0 standard drinks of alcohol    Comment: Occas. wine   Drug use: No   Sexual activity: Not Currently    Partners: Male    Birth  control/protection: Surgical    Comment: 1st intercourse 63 yo-More than 5 partners,hysterectomy  Other Topics Concern   Not on file  Social History Narrative   Exercise-- water aerobics   Social Drivers of Health   Financial Resource Strain: Not on file  Food Insecurity: Not on file  Transportation Needs: Not on file  Physical Activity: Not on file  Stress: Not on file  Social Connections: Not on file   Family History  Problem Relation Age of Onset   Heart disease Mother 3       MI   Stroke Mother    Hypertension Mother    Arthritis Mother    Pulmonary embolism Mother    Prostate cancer Father    Hypertension Father    Dementia Father    Arthritis Sister    Seizures Sister    Colon cancer Neg Hx    Colon polyps Neg Hx    Esophageal cancer Neg Hx    Rectal cancer Neg Hx    Stomach cancer Neg Hx    Breast cancer Neg Hx    No Known Allergies Current Outpatient Medications  Medication Sig Dispense Refill   albuterol  (VENTOLIN  HFA) 108 (90 Base) MCG/ACT inhaler Inhale 2 puffs  into the lungs every 6 (six) hours as needed for wheezing or shortness of breath.     aspirin  EC 325 MG tablet Take 1 tablet (325 mg total) by mouth daily. 30 tablet 0   aspirin  EC 325 MG tablet Take 1 tablet (325 mg total) by mouth daily. 14 tablet 0   hydrochlorothiazide  (HYDRODIURIL ) 25 MG tablet Take 1 tablet (25 mg total) by mouth daily. 90 tablet 3   levocetirizine (XYZAL ) 5 MG tablet Take 1 tablet (5 mg total) by mouth every evening. 30 tablet 5   oxyCODONE  (ROXICODONE ) 5 MG immediate release tablet Take 1 tablet (5 mg total) by mouth every 4 (four) hours as needed for severe pain (pain score 7-10) or breakthrough pain. 10 tablet 0   oxyCODONE  (ROXICODONE ) 5 MG immediate release tablet Take 1 tablet (5 mg total) by mouth every 4 (four) hours as needed for severe pain (pain score 7-10) or breakthrough pain. 15 tablet 0   Vitamin D , Ergocalciferol , (DRISDOL ) 1.25 MG (50000 UNIT) CAPS capsule TAKE  1 CAPSULE (50,000 UNITS TOTAL) BY MOUTH EVERY 7 (SEVEN) DAYS 12 capsule 1   No current facility-administered medications for this visit.   No results found.  Review of Systems:   A ROS was performed including pertinent positives and negatives as documented in the HPI.   Musculoskeletal Exam:    There were no vitals taken for this visit.  Left hip incisions are well-appearing without erythema or drainage.  30 degrees internal/external rotation of left hip without pain distal neurosensory exams intact with good abduction strength  Imaging:      I personally reviewed and interpreted the radiographs.   Assessment:   6 weeks status post left hip arthroscopic labral repair doing extremely well.  She will return to full activity I will plan to see her back as needed  Plan :    - Return to clinic as needed      I personally saw and evaluated the patient, and participated in the management and treatment plan.  Elspeth Parker, MD Attending Physician, Orthopedic Surgery  This document was dictated using Dragon voice recognition software. A reasonable attempt at proof reading has been made to minimize errors.

## 2024-06-19 ENCOUNTER — Ambulatory Visit: Admitting: Family Medicine

## 2024-06-19 ENCOUNTER — Encounter: Payer: Self-pay | Admitting: Family Medicine

## 2024-06-19 VITALS — BP 132/88 | HR 77 | Temp 98.0°F | Resp 18 | Ht 65.0 in | Wt 195.4 lb

## 2024-06-19 DIAGNOSIS — E559 Vitamin D deficiency, unspecified: Secondary | ICD-10-CM | POA: Diagnosis not present

## 2024-06-19 DIAGNOSIS — R140 Abdominal distension (gaseous): Secondary | ICD-10-CM | POA: Diagnosis not present

## 2024-06-19 DIAGNOSIS — R1013 Epigastric pain: Secondary | ICD-10-CM

## 2024-06-19 DIAGNOSIS — R519 Headache, unspecified: Secondary | ICD-10-CM | POA: Diagnosis not present

## 2024-06-19 DIAGNOSIS — F40240 Claustrophobia: Secondary | ICD-10-CM | POA: Diagnosis not present

## 2024-06-19 LAB — CBC WITH DIFFERENTIAL/PLATELET
Basophils Absolute: 0 K/uL (ref 0.0–0.1)
Basophils Relative: 0.9 % (ref 0.0–3.0)
Eosinophils Absolute: 0 K/uL (ref 0.0–0.7)
Eosinophils Relative: 0.4 % (ref 0.0–5.0)
HCT: 38.3 % (ref 36.0–46.0)
Hemoglobin: 12.4 g/dL (ref 12.0–15.0)
Lymphocytes Relative: 48.6 % — ABNORMAL HIGH (ref 12.0–46.0)
Lymphs Abs: 2.1 K/uL (ref 0.7–4.0)
MCHC: 32.4 g/dL (ref 30.0–36.0)
MCV: 87.8 fl (ref 78.0–100.0)
Monocytes Absolute: 0.3 K/uL (ref 0.1–1.0)
Monocytes Relative: 6.1 % (ref 3.0–12.0)
Neutro Abs: 1.9 K/uL (ref 1.4–7.7)
Neutrophils Relative %: 44 % (ref 43.0–77.0)
Platelets: 315 K/uL (ref 150.0–400.0)
RBC: 4.36 Mil/uL (ref 3.87–5.11)
RDW: 13.6 % (ref 11.5–15.5)
WBC: 4.3 K/uL (ref 4.0–10.5)

## 2024-06-19 LAB — COMPREHENSIVE METABOLIC PANEL WITH GFR
ALT: 13 U/L (ref 0–35)
AST: 23 U/L (ref 0–37)
Albumin: 4.5 g/dL (ref 3.5–5.2)
Alkaline Phosphatase: 120 U/L — ABNORMAL HIGH (ref 39–117)
BUN: 12 mg/dL (ref 6–23)
CO2: 29 meq/L (ref 19–32)
Calcium: 9.3 mg/dL (ref 8.4–10.5)
Chloride: 104 meq/L (ref 96–112)
Creatinine, Ser: 0.77 mg/dL (ref 0.40–1.20)
GFR: 81.55 mL/min (ref >60.00)
Glucose, Bld: 96 mg/dL (ref 70–99)
Potassium: 4.2 meq/L (ref 3.5–5.1)
Sodium: 141 meq/L (ref 135–145)
Total Bilirubin: 0.5 mg/dL (ref 0.2–1.2)
Total Protein: 7.4 g/dL (ref 6.0–8.3)

## 2024-06-19 LAB — LIPID PANEL
Cholesterol: 260 mg/dL — ABNORMAL HIGH (ref 0–200)
HDL: 57.5 mg/dL (ref >39.00)
LDL Cholesterol: 155 mg/dL — ABNORMAL HIGH (ref 0–99)
NonHDL: 202.44
Total CHOL/HDL Ratio: 5
Triglycerides: 235 mg/dL — ABNORMAL HIGH (ref 0.0–149.0)
VLDL: 47 mg/dL — ABNORMAL HIGH (ref 0.0–40.0)

## 2024-06-19 LAB — TSH: TSH: 1.38 u[IU]/mL (ref 0.35–5.50)

## 2024-06-19 LAB — SEDIMENTATION RATE: Sed Rate: 7 mm/h (ref 0–30)

## 2024-06-19 MED ORDER — VITAMIN D (ERGOCALCIFEROL) 1.25 MG (50000 UNIT) PO CAPS: 50000.0000 [IU] | ORAL_CAPSULE | ORAL | 1 refills | Status: AC

## 2024-06-19 MED ORDER — KETOROLAC TROMETHAMINE 60 MG/2ML IM SOLN
60.0000 mg | Freq: Once | INTRAMUSCULAR | Status: AC
  Administered 2024-06-19: 60 mg via INTRAMUSCULAR

## 2024-06-19 MED ORDER — ALPRAZOLAM 0.5 MG PO TABS
ORAL_TABLET | ORAL | 0 refills | Status: AC
Start: 2024-06-19 — End: ?

## 2024-06-19 MED ORDER — PANTOPRAZOLE SODIUM 40 MG PO TBEC: 40.0000 mg | DELAYED_RELEASE_TABLET | Freq: Every day | ORAL | 3 refills | Status: AC

## 2024-06-19 NOTE — Progress Notes (Signed)
 Subjective:    Patient ID: [name], female    DOB: [birth year], 63 y.o.   MRN: [id number]  Chief Complaint  Patient presents with   Headache    Pt states having a headache everyday this week and having a burning stomach    HPI Patient is in today for headaches that started after surgery.  Discussed the use of AI scribe software for clinical note transcription with the patient, who gave verbal consent to proceed.  History of Present Illness [name] is a 63 year old female who presents with persistent headaches and stomach issues following surgery.  She has been experiencing persistent headaches since her surgery. The headaches began shortly after the procedure and have been ongoing. They are described as being located diffusely and can last all night, especially after eating. Tylenol  seems to intensify the pain, and she has stopped taking ibuprofen  due to concerns about rebound headaches. The headaches sometimes radiate down her neck. Light sensitivity is present, but noise does not seem to be an issue.  She reports significant stomach issues, including a burning sensation that occurs at night and is exacerbated by eating. The pain is described as severe, with a sensation that food just 'sits on her stomach.' She has tried taking Mili. She experiences bloating and occasional constipation, although she is now going to the bathroom more regularly. She has not taken any pain medication recently.  She recalls a recent episode where she woke up with a bloodshot eye, which was diagnosed as a viral infection by an eye doctor and treated with eye drops. Her vision is generally good, but she sometimes experiences blurriness. Light bothers her eyes.  She mentions having received both the COVID and flu vaccines three weeks ago, which resulted in two days of severe symptoms including inability to eat and significant weight loss. She attributes the onset of her stomach problems to this  event.  She is not currently taking any allergy medication. She has a history of taking ibuprofen  and oxycodone , but has stopped due to adverse effects. She is unsure about the status of her ovaries following a past hysterectomy, but believes one may have been retained. No urinary symptoms suggestive of infection, although she urinates frequently at night. She requires medication to calm down for MRIs.   Past Medical History:  Diagnosis Date   Anxiety    Arthritis    rheumatoid per pt    Past Surgical History:  Procedure Laterality Date   ABDOMINAL HYSTERECTOMY  2001   TAH/BSO fibroids   CATARACT EXTRACTION Right    COLONOSCOPY  04/27/2011   GLUTEUS MINIMUS REPAIR Left 05/08/2023   Procedure: LEFT GLUTEUS MEDIUS  REPAIR  COLLAGEN PATCH;  Surgeon: Genelle Standing, MD;  Location: [facility];  Service: Orthopedics;  Laterality: Left;   obstructed bile duct  2004   Dr Ludwig   OOPHORECTOMY     BSO   REVERSE SHOULDER ARTHROPLASTY Left 02/14/2022   Procedure: LEFT REVERSE SHOULDER ARTHROPLASTY;  Surgeon: Genelle Standing, MD;  Location: [facility];  Service: Orthopedics;  Laterality: Left;   ROTATOR CUFF REPAIR Bilateral 12/2010   Left and right    Family History  Problem Relation Age of Onset   Heart disease Mother 81       MI   Stroke Mother    Hypertension Mother    Arthritis Mother    Pulmonary embolism Mother    Prostate cancer Father    Hypertension Father  Dementia Father    Arthritis Sister    Seizures Sister    Colon cancer Neg Hx    Colon polyps Neg Hx    Esophageal cancer Neg Hx    Rectal cancer Neg Hx    Stomach cancer Neg Hx    Breast cancer Neg Hx     Social History   Socioeconomic History   Marital status: Single    Spouse name: Not on file   Number of children: 1   Years of education: Not on file   Highest education level: Not on file  Occupational History   Occupation: MEAT CUTTER    Employer: [employer]  Tobacco Use   Smoking  status: Never   Smokeless tobacco: Never  Vaping Use   Vaping status: Never Used  Substance and Sexual Activity   Alcohol use: Yes    Alcohol/week: 0.0 standard drinks of alcohol    Comment: Occas. wine   Drug use: No   Sexual activity: Not Currently    Partners: Male    Birth control/protection: Surgical    Comment: 1st intercourse 63 yo-More than 5 partners,hysterectomy  Other Topics Concern   Not on file  Social History Narrative   Exercise-- water aerobics   Social Drivers of Health   Financial Resource Strain: Not on file  Food Insecurity: Not on file  Transportation Needs: Not on file  Physical Activity: Not on file  Stress: Not on file  Social Connections: Not on file  Intimate Partner Violence: Not on file    Outpatient Medications Prior to Visit  Medication Sig Dispense Refill   albuterol  (VENTOLIN  HFA) 108 (90 Base) MCG/ACT inhaler Inhale 2 puffs into the lungs every 6 (six) hours as needed for wheezing or shortness of breath.     aspirin  EC 325 MG tablet Take 1 tablet (325 mg total) by mouth daily. 30 tablet 0   aspirin  EC 325 MG tablet Take 1 tablet (325 mg total) by mouth daily. 14 tablet 0   hydrochlorothiazide  (HYDRODIURIL ) 25 MG tablet Take 1 tablet (25 mg total) by mouth daily. 90 tablet 3   levocetirizine (XYZAL ) 5 MG tablet Take 1 tablet (5 mg total) by mouth every evening. 30 tablet 5   oxyCODONE  (ROXICODONE ) 5 MG immediate release tablet Take 1 tablet (5 mg total) by mouth every 4 (four) hours as needed for severe pain (pain score 7-10) or breakthrough pain. 10 tablet 0   oxyCODONE  (ROXICODONE ) 5 MG immediate release tablet Take 1 tablet (5 mg total) by mouth every 4 (four) hours as needed for severe pain (pain score 7-10) or breakthrough pain. 15 tablet 0   Vitamin D , Ergocalciferol , (DRISDOL ) 1.25 MG (50000 UNIT) CAPS capsule TAKE 1 CAPSULE (50,000 UNITS TOTAL) BY MOUTH EVERY 7 (SEVEN) DAYS 12 capsule 1   No facility-administered medications prior to  visit.    No Known Allergies  Review of Systems  Constitutional:  Negative for fever and malaise/fatigue.  HENT:  Negative for congestion.   Eyes:  Negative for blurred vision.  Respiratory:  Negative for shortness of breath.   Cardiovascular:  Negative for chest pain, palpitations and leg swelling.  Gastrointestinal:  Positive for abdominal pain and heartburn. Negative for blood in stool and nausea.  Genitourinary:  Negative for dysuria and frequency.  Musculoskeletal:  Negative for falls.  Skin:  Negative for rash.  Neurological:  Positive for headaches. Negative for dizziness and loss of consciousness.  Endo/Heme/Allergies:  Negative for environmental allergies.  Psychiatric/Behavioral:  Negative for depression. The patient is not nervous/anxious.        Objective:    Physical Exam  BP 132/88 (BP Location: Right Arm, Patient Position: Sitting, Cuff Size: Large)   Pulse 77   Temp 98 F (36.7 C) (Oral)   Resp 18   Ht 5' 5 (1.651 m)   Wt 195 lb 6.4 oz (88.6 kg)   SpO2 97%   BMI 32.52 kg/m  Wt Readings from Last 3 Encounters:  06/19/24 195 lb 6.4 oz (88.6 kg)  04/29/24 195 lb 1.7 oz (88.5 kg)  03/25/24 196 lb 9.6 oz (89.2 kg)    Diabetic Foot Exam - Simple   No data filed    Lab Results  Component Value Date   WBC 4.4 01/22/2024   HGB 12.2 01/22/2024   HCT 37.6 01/22/2024   PLT 194.0 01/22/2024   GLUCOSE 80 01/22/2024   CHOL 240 (H) 01/22/2024   TRIG 125.0 01/22/2024   HDL 63.10 01/22/2024   LDLDIRECT 136.6 09/19/2012   LDLCALC 152 (H) 01/22/2024   ALT 8 01/22/2024   AST 18 01/22/2024   NA 140 01/22/2024   K 4.2 01/22/2024   CL 106 01/22/2024   CREATININE 0.67 01/22/2024   BUN 14 01/22/2024   CO2 26 01/22/2024   TSH 1.36 01/22/2024   INR 1.1 (H) 04/06/2023    Lab Results  Component Value Date   TSH 1.36 01/22/2024   Lab Results  Component Value Date   WBC 4.4 01/22/2024   HGB 12.2 01/22/2024   HCT 37.6 01/22/2024   MCV 87.1 01/22/2024    PLT 194.0 01/22/2024   Lab Results  Component Value Date   NA 140 01/22/2024   K 4.2 01/22/2024   CO2 26 01/22/2024   GLUCOSE 80 01/22/2024   BUN 14 01/22/2024   CREATININE 0.67 01/22/2024   BILITOT 0.5 01/22/2024   ALKPHOS 106 01/22/2024   AST 18 01/22/2024   ALT 8 01/22/2024   PROT 6.9 01/22/2024   ALBUMIN 4.2 01/22/2024   CALCIUM 9.0 01/22/2024   ANIONGAP 14 10/02/2022   GFR 92.67 01/22/2024   Lab Results  Component Value Date   CHOL 240 (H) 01/22/2024   Lab Results  Component Value Date   HDL 63.10 01/22/2024   Lab Results  Component Value Date   LDLCALC 152 (H) 01/22/2024   Lab Results  Component Value Date   TRIG 125.0 01/22/2024   Lab Results  Component Value Date   CHOLHDL 4 01/22/2024   No results found for: HGBA1C     Assessment & Plan:  Worsening headaches Assessment & Plan: Worse with tylenol  Ib aggreavated her stomach pain  Toradol  60 IM given in office   Orders: -     MR BRAIN WO CONTRAST; Future -     Ketorolac  Tromethamine  -     CBC with Differential/Platelet -     Comprehensive metabolic panel with GFR -     Sedimentation rate  Vitamin D  deficiency -     Vitamin D  (Ergocalciferol ); Take 1 capsule (50,000 Units total) by mouth every 7 (seven) days.  Dispense: 12 capsule; Refill: 1  Midepigastric pain Assessment & Plan: Protonix daily Check us    Orders: -     Pantoprazole Sodium; Take 1 tablet (40 mg total) by mouth daily.  Dispense: 30 tablet; Refill: 3 -     US  Abdomen Complete; Future -     CBC with Differential/Platelet -     Comprehensive metabolic panel with  GFR -     Lipid panel -     TSH -     Sedimentation rate  Claustrophobia -     ALPRAZolam; 1 po 30 min prior to procedure and tid prn  Dispense: 10 tablet; Refill: 0  Bloating Assessment & Plan: Check us  pelvis   Orders: -     US  Abdomen Complete; Future -     US  PELVIC COMPLETE WITH TRANSVAGINAL; Future   Assessment and Plan Assessment & Plan Chronic  headaches   Chronic headaches persist post-surgery, worsened by eating, and unrelieved by Tylenol , which intensifies the pain. Ibuprofen  was stopped due to potential rebound headaches and gastrointestinal issues. Symptoms include photophobia and occasional neck pain. Sinus issues are considered, though no congestion is present. Due to worsening and prolonged headaches, an MRI is warranted. Administer Toradol  injection for relief. Order MRI of the brain. Prescribe alprazolam for MRI to manage claustrophobia. Continue allergy medication to assess sinus involvement.  Chronic epigastric pain and bloating   Chronic epigastric pain and bloating, worsened by eating, with burning and bloating symptoms, began post-surgery and are aggravated by ibuprofen . Acid reflux is considered, though she has not trialed recommended medication. Symptoms are not linked to constipation or urinary issues. Prescribe pantoprazole once daily. Order blood work for further evaluation.   Alyscia Carmon R Lowne Chase, DO

## 2024-06-19 NOTE — Assessment & Plan Note (Signed)
 Check us  pelvis

## 2024-06-19 NOTE — Assessment & Plan Note (Signed)
 Worse with tylenol  Ib aggreavated her stomach pain  Toradol  60 IM given in office

## 2024-06-19 NOTE — Assessment & Plan Note (Signed)
 Protonix daily Check us 

## 2024-06-20 ENCOUNTER — Telehealth: Payer: Self-pay

## 2024-06-20 ENCOUNTER — Encounter: Payer: Self-pay | Admitting: Family Medicine

## 2024-06-20 NOTE — Telephone Encounter (Signed)
 Copied from CRM #[id number]. Topic: Clinical - Medical Advice >> Jun 20, 2024 12:52 PM Franky GRADE wrote: Reason for CRM: Patient was seen in the office yesterday and forgot to ask Jamee Antonio Meth what she can take for a yeast infection.

## 2024-06-24 ENCOUNTER — Ambulatory Visit: Admitting: Family Medicine

## 2024-06-24 ENCOUNTER — Other Ambulatory Visit: Payer: Self-pay | Admitting: Family Medicine

## 2024-06-24 DIAGNOSIS — R519 Headache, unspecified: Secondary | ICD-10-CM

## 2024-06-24 NOTE — Telephone Encounter (Signed)
 Spoke with patient. Pt states she did get the 3 day monistat and states sxs are improving. Pt advised if sxs return or worsen to call for appointment

## 2024-06-25 ENCOUNTER — Encounter: Payer: Self-pay | Admitting: Neurology

## 2024-06-25 ENCOUNTER — Ambulatory Visit (HOSPITAL_BASED_OUTPATIENT_CLINIC_OR_DEPARTMENT_OTHER)
Admission: RE | Admit: 2024-06-25 | Discharge: 2024-06-25 | Disposition: A | Discharge status: Home / Self Care | Source: Ambulatory Visit | Attending: Family Medicine | Admitting: Family Medicine

## 2024-06-25 DIAGNOSIS — R1013 Epigastric pain: Secondary | ICD-10-CM | POA: Insufficient documentation

## 2024-06-25 DIAGNOSIS — R102 Pelvic and perineal pain unspecified side: Secondary | ICD-10-CM | POA: Diagnosis not present

## 2024-06-25 DIAGNOSIS — Z90710 Acquired absence of both cervix and uterus: Secondary | ICD-10-CM | POA: Diagnosis not present

## 2024-06-25 DIAGNOSIS — K8020 Calculus of gallbladder without cholecystitis without obstruction: Secondary | ICD-10-CM | POA: Diagnosis not present

## 2024-06-25 DIAGNOSIS — R140 Abdominal distension (gaseous): Secondary | ICD-10-CM

## 2024-06-29 ENCOUNTER — Ambulatory Visit: Payer: Self-pay | Admitting: Family Medicine

## 2024-06-29 DIAGNOSIS — E785 Hyperlipidemia, unspecified: Secondary | ICD-10-CM

## 2024-06-29 DIAGNOSIS — E559 Vitamin D deficiency, unspecified: Secondary | ICD-10-CM

## 2024-07-05 ENCOUNTER — Ambulatory Visit
Admission: RE | Admit: 2024-07-05 | Discharge: 2024-07-05 | Disposition: A | Discharge status: Home / Self Care | Source: Ambulatory Visit | Attending: Family Medicine | Admitting: Family Medicine

## 2024-07-05 DIAGNOSIS — R519 Headache, unspecified: Secondary | ICD-10-CM

## 2024-07-07 ENCOUNTER — Encounter: Payer: Self-pay | Admitting: Radiology

## 2024-07-10 DIAGNOSIS — H40011 Open angle with borderline findings, low risk, right eye: Secondary | ICD-10-CM | POA: Diagnosis not present

## 2024-07-10 DIAGNOSIS — Z961 Presence of intraocular lens: Secondary | ICD-10-CM | POA: Diagnosis not present

## 2024-07-10 DIAGNOSIS — H04121 Dry eye syndrome of right lacrimal gland: Secondary | ICD-10-CM | POA: Diagnosis not present

## 2024-07-18 DIAGNOSIS — N3941 Urge incontinence: Secondary | ICD-10-CM | POA: Diagnosis not present

## 2024-07-18 DIAGNOSIS — N3281 Overactive bladder: Secondary | ICD-10-CM | POA: Diagnosis not present

## 2024-07-18 DIAGNOSIS — N952 Postmenopausal atrophic vaginitis: Secondary | ICD-10-CM | POA: Diagnosis not present

## 2024-07-18 DIAGNOSIS — R351 Nocturia: Secondary | ICD-10-CM | POA: Diagnosis not present

## 2024-07-25 NOTE — Progress Notes (Deleted)
 Initial neurology clinic note  [name] MRN: [id number] DOB: 23-Feb-1961  Referring provider: Antonio Huffman Jamee Huffman, *  Primary care provider: Antonio Huffman, Jamee JONELLE, Huffman  Reason for consult:  headaches  Subjective:  This is [name], a 63 y.o. ***-handed female with a medical history of HTN, arthritis (RA?), vit D deficiency, anxiety who presents to neurology clinic with headaches. The patient is accompanied by ***.  *** Persistent headaches since surgery (hip surgery? 04/29/24) All over head Can last all night, especially after eating Tylenol  makes pain worse Ibuprofen  was stopped due to concerns of rebound headaches Sometimes radiate down neck Photophobia  Also have stomach issues since surgery Burning at night worse with eating - severe  MEDICATIONS:  Outpatient Encounter Medications as of 08/08/2024  Medication Sig   albuterol  (VENTOLIN  HFA) 108 (90 Base) MCG/ACT inhaler Inhale 2 puffs into the lungs every 6 (six) hours as needed for wheezing or shortness of breath.   ALPRAZolam  (XANAX ) 0.5 MG tablet 1 po 30 min prior to procedure and tid prn   aspirin  EC 325 MG tablet Take 1 tablet (325 mg total) by mouth daily.   aspirin  EC 325 MG tablet Take 1 tablet (325 mg total) by mouth daily.   hydrochlorothiazide  (HYDRODIURIL ) 25 MG tablet Take 1 tablet (25 mg total) by mouth daily.   levocetirizine (XYZAL ) 5 MG tablet Take 1 tablet (5 mg total) by mouth every evening.   oxyCODONE  (ROXICODONE ) 5 MG immediate release tablet Take 1 tablet (5 mg total) by mouth every 4 (four) hours as needed for severe pain (pain score 7-10) or breakthrough pain.   oxyCODONE  (ROXICODONE ) 5 MG immediate release tablet Take 1 tablet (5 mg total) by mouth every 4 (four) hours as needed for severe pain (pain score 7-10) or breakthrough pain.   pantoprazole  (PROTONIX ) 40 MG tablet Take 1 tablet (40 mg total) by mouth daily.   Vitamin D , Ergocalciferol , (DRISDOL ) 1.25 MG (50000 UNIT) CAPS  capsule Take 1 capsule (50,000 Units total) by mouth every 7 (seven) days.   No facility-administered encounter medications on file as of 08/08/2024.    PAST MEDICAL HISTORY: Past Medical History:  Diagnosis Date   Anxiety    Arthritis    rheumatoid per pt    PAST SURGICAL HISTORY: Past Surgical History:  Procedure Laterality Date   ABDOMINAL HYSTERECTOMY  2001   TAH/BSO fibroids   CATARACT EXTRACTION Right    COLONOSCOPY  04/27/2011   GLUTEUS MINIMUS REPAIR Left 05/08/2023   Procedure: LEFT GLUTEUS MEDIUS  REPAIR  COLLAGEN PATCH;  Surgeon: Audrey Standing, MD;  Location: [facility];  Service: Orthopedics;  Laterality: Left;   obstructed bile duct  2004   Dr [name]   OOPHORECTOMY     BSO   REVERSE SHOULDER ARTHROPLASTY Left 02/14/2022   Procedure: LEFT REVERSE SHOULDER ARTHROPLASTY;  Surgeon: Audrey Standing, MD;  Location: [facility];  Service: Orthopedics;  Laterality: Left;   ROTATOR CUFF REPAIR Bilateral 12/2010   Left and right    ALLERGIES: No Known Allergies  FAMILY HISTORY: Family History  Problem Relation Age of Onset   Heart disease Mother 79       MI   Stroke Mother    Hypertension Mother    Arthritis Mother    Pulmonary embolism Mother    Prostate cancer Father    Hypertension Father    Dementia Father    Arthritis Sister    Seizures Sister    Colon  cancer Neg Hx    Colon polyps Neg Hx    Esophageal cancer Neg Hx    Rectal cancer Neg Hx    Stomach cancer Neg Hx    Breast cancer Neg Hx     SOCIAL HISTORY: Social History   Tobacco Use   Smoking status: Never   Smokeless tobacco: Never  Vaping Use   Vaping status: Never Used  Substance Use Topics   Alcohol use: Yes    Alcohol/week: 0.0 standard drinks of alcohol    Comment: Occas. wine   Drug use: No   Social History   Social History Narrative   Exercise-- water aerobics    Objective:  Vital Signs:  There were no vitals taken for this visit.  ***  Labs and Imaging  review: Internal labs: 06/19/24: ESR 7 TSH wnl Lipid panel: tChol 260, LDL 155, TG 235.0 CMP significant for mildly elevated alk phos (120) CBC w/ diff unremarkable  01/22/24: Vit D low at 20.98  External labs: ***  Imaging/Procedures: EKG (03/25/24): normal QT  MRI lumbar spine wo contrast (03/01/24): IMPRESSION: Degenerative disc disease greatest in the lower thoracic spine without endplate marrow edema.   No disc protrusion or degenerative stenosis.  MRI brain wo contrast (07/05/24): BRAIN AND VENTRICLES: No acute infarct. No intracranial hemorrhage. No mass. No midline shift. No hydrocephalus. Mild subcortical and periventricular T2 and FLAIR signal hyperintensity likely reflecting sequelae of chronic microvascular ischemia. The sella is unremarkable. Normal flow voids.   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   SINUSES AND MASTOIDS: No acute abnormality.   BONES AND SOFT TISSUES: Normal marrow signal. No acute soft tissue abnormality.   IMPRESSION: 1. No acute intracranial abnormality.  Assessment/Plan:  [name] is a 63 y.o. female who presents for evaluation of ***. *** has a relevant medical history of ***. *** neurological examination is pertinent for ***. Available diagnostic data is significant for ***. This constellation of symptoms and objective data would most likely localize to ***. ***  PLAN: -Blood work: *** ***  -Return to clinic ***  The impression above as well as the plan as outlined below were extensively discussed with the patient (in the company of ***) who voiced understanding. All questions were answered to their satisfaction.  The patient was counseled on pertinent fall precautions per the printed material provided today, and as noted under the Patient Instructions section below.***  When available, results of the above investigations and possible  further recommendations will be communicated to the patient via telephone/MyChart. Patient to call office if not contacted after expected testing turnaround time.   Total time spent reviewing records, interview, history/exam, documentation, and coordination of care on day of encounter:  *** min   Thank you for allowing me to participate in patient's care.  If I can answer any additional questions, I would be pleased to Huffman so.  Venetia Potters, MD   CC: Audrey Meth, Jamee SAUNDERS, Huffman [address] [place] [address]  CC: Referring provider: Antonio Meth Jamee SAUNDERS, Huffman [address] [place],  [place] [address]

## 2024-08-08 ENCOUNTER — Ambulatory Visit: Admitting: Neurology

## 2024-09-25 ENCOUNTER — Ambulatory Visit

## 2024-11-19 ENCOUNTER — Ambulatory Visit: Admitting: Neurology

## 2025-01-22 ENCOUNTER — Encounter: Admitting: Family Medicine
